# Patient Record
Sex: Male | Born: 1935 | Race: White | Hispanic: No | Smoking: Never smoker
Health system: Southern US, Community
[De-identification: ages and names within clinical notes are randomized; demographics above are authoritative.]

## PROBLEM LIST (undated history)

## (undated) DIAGNOSIS — N189 Chronic kidney disease, unspecified: Secondary | ICD-10-CM

## (undated) DIAGNOSIS — G4700 Insomnia, unspecified: Secondary | ICD-10-CM

## (undated) DIAGNOSIS — J45909 Unspecified asthma, uncomplicated: Secondary | ICD-10-CM

## (undated) DIAGNOSIS — N400 Enlarged prostate without lower urinary tract symptoms: Secondary | ICD-10-CM

## (undated) DIAGNOSIS — E119 Type 2 diabetes mellitus without complications: Secondary | ICD-10-CM

## (undated) DIAGNOSIS — I2510 Atherosclerotic heart disease of native coronary artery without angina pectoris: Secondary | ICD-10-CM

## (undated) DIAGNOSIS — I509 Heart failure, unspecified: Secondary | ICD-10-CM

## (undated) DIAGNOSIS — N289 Disorder of kidney and ureter, unspecified: Secondary | ICD-10-CM

## (undated) DIAGNOSIS — I739 Peripheral vascular disease, unspecified: Secondary | ICD-10-CM

## (undated) DIAGNOSIS — I4891 Unspecified atrial fibrillation: Secondary | ICD-10-CM

## (undated) DIAGNOSIS — E785 Hyperlipidemia, unspecified: Secondary | ICD-10-CM

## (undated) HISTORY — PX: CORONARY ARTERY BYPASS GRAFT: SHX141

## (undated) HISTORY — PX: CARDIAC SURGERY: SHX584

---

## 2004-06-19 ENCOUNTER — Other Ambulatory Visit: Payer: Self-pay

## 2004-06-19 ENCOUNTER — Ambulatory Visit: Payer: Self-pay | Admitting: Ophthalmology

## 2004-07-10 ENCOUNTER — Emergency Department: Payer: Self-pay | Admitting: Unknown Physician Specialty

## 2004-07-30 ENCOUNTER — Ambulatory Visit: Payer: Self-pay | Admitting: Ophthalmology

## 2004-08-07 ENCOUNTER — Ambulatory Visit: Payer: Self-pay | Admitting: Pain Medicine

## 2004-08-13 ENCOUNTER — Ambulatory Visit: Payer: Self-pay | Admitting: Pain Medicine

## 2004-09-03 ENCOUNTER — Ambulatory Visit: Payer: Self-pay | Admitting: Pain Medicine

## 2005-05-17 ENCOUNTER — Ambulatory Visit: Payer: Self-pay | Admitting: Cardiovascular Disease

## 2006-12-16 ENCOUNTER — Other Ambulatory Visit: Payer: Self-pay

## 2006-12-16 ENCOUNTER — Inpatient Hospital Stay: Payer: Self-pay | Admitting: Internal Medicine

## 2007-02-03 ENCOUNTER — Other Ambulatory Visit: Payer: Self-pay

## 2007-02-03 ENCOUNTER — Ambulatory Visit: Payer: Self-pay | Admitting: Vascular Surgery

## 2007-02-03 ENCOUNTER — Inpatient Hospital Stay: Payer: Self-pay | Admitting: Vascular Surgery

## 2007-08-04 ENCOUNTER — Emergency Department (HOSPITAL_COMMUNITY): Admission: EM | Admit: 2007-08-04 | Discharge: 2007-08-04 | Payer: Self-pay | Admitting: Emergency Medicine

## 2007-09-07 ENCOUNTER — Emergency Department (HOSPITAL_COMMUNITY): Admission: EM | Admit: 2007-09-07 | Discharge: 2007-09-07 | Payer: Self-pay | Admitting: Emergency Medicine

## 2008-10-10 ENCOUNTER — Emergency Department: Payer: Self-pay | Admitting: Emergency Medicine

## 2008-10-13 ENCOUNTER — Emergency Department: Payer: Self-pay | Admitting: Emergency Medicine

## 2009-08-10 ENCOUNTER — Ambulatory Visit: Payer: Self-pay | Admitting: Family Medicine

## 2009-10-05 ENCOUNTER — Emergency Department: Payer: Self-pay | Admitting: Emergency Medicine

## 2009-10-17 ENCOUNTER — Ambulatory Visit: Payer: Self-pay | Admitting: Family Medicine

## 2009-11-01 ENCOUNTER — Emergency Department: Payer: Self-pay | Admitting: Emergency Medicine

## 2010-09-27 ENCOUNTER — Ambulatory Visit: Payer: Self-pay | Admitting: Unknown Physician Specialty

## 2012-08-10 ENCOUNTER — Ambulatory Visit: Payer: Self-pay | Admitting: Family Medicine

## 2013-01-29 ENCOUNTER — Inpatient Hospital Stay: Payer: Self-pay | Admitting: Student

## 2013-01-29 LAB — CBC WITH DIFFERENTIAL/PLATELET
Basophil #: 0.1 10*3/uL (ref 0.0–0.1)
Basophil %: 0.3 %
Eosinophil #: 0 10*3/uL (ref 0.0–0.7)
HCT: 40.8 % (ref 40.0–52.0)
Lymphocyte #: 0.7 10*3/uL — ABNORMAL LOW (ref 1.0–3.6)
MCH: 31.5 pg (ref 26.0–34.0)
MCHC: 34.3 g/dL (ref 32.0–36.0)
MCV: 92 fL (ref 80–100)
Monocyte #: 0.8 x10 3/mm (ref 0.2–1.0)
Monocyte %: 4.8 %
Neutrophil #: 15.1 10*3/uL — ABNORMAL HIGH (ref 1.4–6.5)
Neutrophil %: 90.5 %
Platelet: 183 10*3/uL (ref 150–440)
RBC: 4.44 10*6/uL (ref 4.40–5.90)
RDW: 12.9 % (ref 11.5–14.5)

## 2013-01-29 LAB — COMPREHENSIVE METABOLIC PANEL
Albumin: 3.1 g/dL — ABNORMAL LOW (ref 3.4–5.0)
Alkaline Phosphatase: 86 U/L (ref 50–136)
Calcium, Total: 8.1 mg/dL — ABNORMAL LOW (ref 8.5–10.1)
Chloride: 105 mmol/L (ref 98–107)
Co2: 24 mmol/L (ref 21–32)
Creatinine: 1.38 mg/dL — ABNORMAL HIGH (ref 0.60–1.30)
Osmolality: 286 (ref 275–301)
Potassium: 3.9 mmol/L (ref 3.5–5.1)
SGPT (ALT): 19 U/L (ref 12–78)

## 2013-01-29 LAB — URINALYSIS, COMPLETE
Glucose,UR: >=500 mg/dL (ref 0–75)
Ph: 5 (ref 4.5–8.0)
RBC,UR: 4 /HPF (ref 0–5)
Specific Gravity: 1.021 (ref 1.003–1.030)

## 2013-01-29 LAB — TROPONIN I: Troponin-I: <0.02 ng/mL

## 2013-01-29 LAB — RAPID INFLUENZA A&B ANTIGENS

## 2013-01-30 LAB — CBC WITH DIFFERENTIAL/PLATELET
Basophil #: 0.1 10*3/uL (ref 0.0–0.1)
Basophil %: 0.5 %
Eosinophil %: 2.2 %
Lymphocyte #: 1.4 10*3/uL (ref 1.0–3.6)
MCH: 31.9 pg (ref 26.0–34.0)
MCV: 92 fL (ref 80–100)
Monocyte #: 0.8 x10 3/mm (ref 0.2–1.0)
Monocyte %: 6.7 %
Neutrophil %: 78.5 %
Platelet: 186 10*3/uL (ref 150–440)
RBC: 4.35 10*6/uL — ABNORMAL LOW (ref 4.40–5.90)
RDW: 13 % (ref 11.5–14.5)
WBC: 11.7 10*3/uL — ABNORMAL HIGH (ref 3.8–10.6)

## 2013-01-30 LAB — URINE CULTURE

## 2013-02-01 LAB — BASIC METABOLIC PANEL
Anion Gap: 7 (ref 7–16)
BUN: 13 mg/dL (ref 7–18)
Calcium, Total: 8.6 mg/dL (ref 8.5–10.1)
Co2: 24 mmol/L (ref 21–32)
Creatinine: 1.05 mg/dL (ref 0.60–1.30)
EGFR (African American): >60
EGFR (Non-African Amer.): >60
Sodium: 139 mmol/L (ref 136–145)

## 2013-02-03 ENCOUNTER — Emergency Department: Payer: Self-pay | Admitting: Emergency Medicine

## 2013-02-03 LAB — CULTURE, BLOOD (SINGLE)

## 2013-02-04 ENCOUNTER — Emergency Department: Payer: Self-pay | Admitting: Emergency Medicine

## 2013-02-04 LAB — CBC WITH DIFFERENTIAL/PLATELET
Basophil #: 0.1 10*3/uL (ref 0.0–0.1)
Basophil %: 0.6 %
Eosinophil #: 0.4 10*3/uL (ref 0.0–0.7)
HCT: 39.5 % — ABNORMAL LOW (ref 40.0–52.0)
Lymphocyte #: 1.4 10*3/uL (ref 1.0–3.6)
Lymphocyte %: 11.1 %
MCH: 31.8 pg (ref 26.0–34.0)
MCHC: 34.3 g/dL (ref 32.0–36.0)
Monocyte #: 1.1 x10 3/mm — ABNORMAL HIGH (ref 0.2–1.0)
Monocyte %: 8.2 %
Neutrophil %: 77.3 %
Platelet: 223 10*3/uL (ref 150–440)
RDW: 13 % (ref 11.5–14.5)
WBC: 12.9 10*3/uL — ABNORMAL HIGH (ref 3.8–10.6)

## 2013-02-04 LAB — BASIC METABOLIC PANEL
Anion Gap: 7 (ref 7–16)
Calcium, Total: 8.9 mg/dL (ref 8.5–10.1)
Chloride: 102 mmol/L (ref 98–107)
Creatinine: 1.27 mg/dL (ref 0.60–1.30)
EGFR (African American): 58 — ABNORMAL LOW
EGFR (Non-African Amer.): 50 — ABNORMAL LOW
Osmolality: 274 (ref 275–301)

## 2013-02-04 LAB — URINALYSIS, COMPLETE
Bacteria: NONE SEEN
Bilirubin,UR: NEGATIVE
Glucose,UR: NEGATIVE mg/dL (ref 0–75)
Ketone: NEGATIVE
Leukocyte Esterase: NEGATIVE
Ph: 6 (ref 4.5–8.0)
Squamous Epithelial: NONE SEEN

## 2013-02-05 ENCOUNTER — Emergency Department: Payer: Self-pay | Admitting: Emergency Medicine

## 2013-02-08 ENCOUNTER — Emergency Department: Payer: Self-pay | Admitting: Emergency Medicine

## 2013-02-16 ENCOUNTER — Ambulatory Visit: Payer: Self-pay | Admitting: Family Medicine

## 2013-07-15 ENCOUNTER — Ambulatory Visit: Payer: Self-pay | Admitting: Otolaryngology

## 2013-09-09 ENCOUNTER — Ambulatory Visit: Payer: Self-pay | Admitting: Family Medicine

## 2013-09-12 ENCOUNTER — Inpatient Hospital Stay: Payer: Self-pay | Admitting: Internal Medicine

## 2013-09-12 LAB — CBC
HCT: 40.4 % (ref 40.0–52.0)
HGB: 13.4 g/dL (ref 13.0–18.0)
MCH: 31 pg (ref 26.0–34.0)
MCHC: 33.2 g/dL (ref 32.0–36.0)
MCV: 93 fL (ref 80–100)
Platelet: 204 10*3/uL (ref 150–440)
RBC: 4.33 10*6/uL — AB (ref 4.40–5.90)
RDW: 13.1 % (ref 11.5–14.5)
WBC: 11.4 10*3/uL — ABNORMAL HIGH (ref 3.8–10.6)

## 2013-09-12 LAB — BASIC METABOLIC PANEL
Anion Gap: 6 — ABNORMAL LOW (ref 7–16)
BUN: 24 mg/dL — ABNORMAL HIGH (ref 7–18)
Calcium, Total: 8.6 mg/dL (ref 8.5–10.1)
Chloride: 104 mmol/L (ref 98–107)
Co2: 25 mmol/L (ref 21–32)
Creatinine: 1.19 mg/dL (ref 0.60–1.30)
GFR CALC NON AF AMER: 54 — AB
Glucose: 156 mg/dL — ABNORMAL HIGH (ref 65–99)
Osmolality: 277 (ref 275–301)
POTASSIUM: 4 mmol/L (ref 3.5–5.1)
Sodium: 135 mmol/L — ABNORMAL LOW (ref 136–145)

## 2013-09-12 LAB — CK TOTAL AND CKMB (NOT AT ARMC)
CK, Total: 54 U/L
CK, Total: 55 U/L
CK, Total: 63 U/L
CK-MB: 2.2 ng/mL (ref 0.5–3.6)
CK-MB: 1.9 ng/mL (ref 0.5–3.6)
CK-MB: 2 ng/mL (ref 0.5–3.6)

## 2013-09-12 LAB — TROPONIN I
Troponin-I: 0.06 ng/mL — ABNORMAL HIGH
Troponin-I: 0.05 ng/mL
Troponin-I: 0.06 ng/mL — ABNORMAL HIGH

## 2013-09-12 LAB — PRO B NATRIURETIC PEPTIDE: B-Type Natriuretic Peptide: 2073 pg/mL — ABNORMAL HIGH (ref 0–450)

## 2013-09-13 LAB — BASIC METABOLIC PANEL
Anion Gap: 6 — ABNORMAL LOW (ref 7–16)
BUN: 18 mg/dL (ref 7–18)
Calcium, Total: 8.5 mg/dL (ref 8.5–10.1)
Chloride: 107 mmol/L (ref 98–107)
Co2: 28 mmol/L (ref 21–32)
Creatinine: 1.23 mg/dL (ref 0.60–1.30)
EGFR (Non-African Amer.): 52 — ABNORMAL LOW
GFR CALC AF AMER: 60 — AB
Glucose: 67 mg/dL (ref 65–99)
Osmolality: 281 (ref 275–301)
Potassium: 3.3 mmol/L — ABNORMAL LOW (ref 3.5–5.1)
SODIUM: 141 mmol/L (ref 136–145)

## 2013-09-13 LAB — CBC WITH DIFFERENTIAL/PLATELET
BASOS ABS: 0 10*3/uL (ref 0.0–0.1)
Basophil %: 0.5 %
EOS ABS: 0.2 10*3/uL (ref 0.0–0.7)
Eosinophil %: 2.4 %
HCT: 39.5 % — ABNORMAL LOW (ref 40.0–52.0)
HGB: 13.3 g/dL (ref 13.0–18.0)
Lymphocyte #: 1.4 10*3/uL (ref 1.0–3.6)
Lymphocyte %: 15.2 %
MCH: 31.1 pg (ref 26.0–34.0)
MCHC: 33.6 g/dL (ref 32.0–36.0)
MCV: 93 fL (ref 80–100)
MONOS PCT: 9.5 %
Monocyte #: 0.9 x10 3/mm (ref 0.2–1.0)
Neutrophil #: 6.8 10*3/uL — ABNORMAL HIGH (ref 1.4–6.5)
Neutrophil %: 72.4 %
Platelet: 214 10*3/uL (ref 150–440)
RBC: 4.27 10*6/uL — ABNORMAL LOW (ref 4.40–5.90)
RDW: 13.4 % (ref 11.5–14.5)
WBC: 9.4 10*3/uL (ref 3.8–10.6)

## 2013-09-13 LAB — LIPID PANEL
Cholesterol: 113 mg/dL (ref 0–200)
HDL Cholesterol: 36 mg/dL — ABNORMAL LOW (ref 40–60)
Ldl Cholesterol, Calc: 64 mg/dL (ref 0–100)
Triglycerides: 63 mg/dL (ref 0–200)
VLDL CHOLESTEROL, CALC: 13 mg/dL (ref 5–40)

## 2013-09-13 LAB — TSH: Thyroid Stimulating Horm: 1.19 u[IU]/mL

## 2013-09-14 LAB — BASIC METABOLIC PANEL
ANION GAP: 5 — AB (ref 7–16)
BUN: 25 mg/dL — ABNORMAL HIGH (ref 7–18)
CALCIUM: 8.7 mg/dL (ref 8.5–10.1)
CHLORIDE: 102 mmol/L (ref 98–107)
CO2: 31 mmol/L (ref 21–32)
CREATININE: 1.48 mg/dL — AB (ref 0.60–1.30)
EGFR (Non-African Amer.): 41 — ABNORMAL LOW
GFR CALC AF AMER: 48 — AB
Glucose: 124 mg/dL — ABNORMAL HIGH (ref 65–99)
Osmolality: 281 (ref 275–301)
POTASSIUM: 3.6 mmol/L (ref 3.5–5.1)
Sodium: 138 mmol/L (ref 136–145)

## 2013-09-15 LAB — CBC WITH DIFFERENTIAL/PLATELET
Basophil #: 0.1 10*3/uL (ref 0.0–0.1)
Basophil %: 0.4 %
Eosinophil #: 0.5 10*3/uL (ref 0.0–0.7)
Eosinophil %: 3.8 %
HCT: 41.2 % (ref 40.0–52.0)
HGB: 13.5 g/dL (ref 13.0–18.0)
LYMPHS ABS: 1.2 10*3/uL (ref 1.0–3.6)
Lymphocyte %: 8.8 %
MCH: 30.4 pg (ref 26.0–34.0)
MCHC: 32.8 g/dL (ref 32.0–36.0)
MCV: 93 fL (ref 80–100)
Monocyte #: 1.2 x10 3/mm — ABNORMAL HIGH (ref 0.2–1.0)
Monocyte %: 8.8 %
NEUTROS ABS: 10.7 10*3/uL — AB (ref 1.4–6.5)
NEUTROS PCT: 78.2 %
PLATELETS: 226 10*3/uL (ref 150–440)
RBC: 4.44 10*6/uL (ref 4.40–5.90)
RDW: 13 % (ref 11.5–14.5)
WBC: 13.7 10*3/uL — ABNORMAL HIGH (ref 3.8–10.6)

## 2013-09-15 LAB — BASIC METABOLIC PANEL
ANION GAP: 4 — AB (ref 7–16)
BUN: 23 mg/dL — ABNORMAL HIGH (ref 7–18)
CO2: 32 mmol/L (ref 21–32)
Calcium, Total: 8.7 mg/dL (ref 8.5–10.1)
Chloride: 100 mmol/L (ref 98–107)
Creatinine: 1.36 mg/dL — ABNORMAL HIGH (ref 0.60–1.30)
GFR CALC AF AMER: 53 — AB
GFR CALC NON AF AMER: 46 — AB
GLUCOSE: 72 mg/dL (ref 65–99)
Osmolality: 274 (ref 275–301)
POTASSIUM: 3.3 mmol/L — AB (ref 3.5–5.1)
Sodium: 136 mmol/L (ref 136–145)

## 2013-09-26 ENCOUNTER — Emergency Department: Payer: Self-pay | Admitting: Emergency Medicine

## 2013-09-26 LAB — URINALYSIS, COMPLETE
Bilirubin,UR: NEGATIVE
Glucose,UR: NEGATIVE mg/dL (ref 0–75)
Hyaline Cast: 3
Ketone: NEGATIVE
Nitrite: NEGATIVE
PH: 5 (ref 4.5–8.0)
PROTEIN: NEGATIVE
RBC,UR: 12 /HPF (ref 0–5)
SPECIFIC GRAVITY: 1.013 (ref 1.003–1.030)
Squamous Epithelial: NONE SEEN
WBC UR: 728 /HPF (ref 0–5)

## 2013-09-29 LAB — URINE CULTURE

## 2013-12-07 ENCOUNTER — Encounter: Payer: Self-pay | Admitting: Podiatry

## 2013-12-07 ENCOUNTER — Ambulatory Visit (INDEPENDENT_AMBULATORY_CARE_PROVIDER_SITE_OTHER): Payer: Medicare Other | Admitting: Podiatry

## 2013-12-07 VITALS — BP 133/86 | HR 69 | Resp 12

## 2013-12-07 DIAGNOSIS — M79609 Pain in unspecified limb: Secondary | ICD-10-CM

## 2013-12-07 DIAGNOSIS — M79673 Pain in unspecified foot: Secondary | ICD-10-CM

## 2013-12-07 DIAGNOSIS — B351 Tinea unguium: Secondary | ICD-10-CM

## 2013-12-07 NOTE — Progress Notes (Signed)
Subjective:     Patient ID: Martin [name], male   DOB: [birth year], 80 y.o.   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

## 2013-12-07 NOTE — Progress Notes (Signed)
   Subjective:    Patient ID: Martin [name], male    DOB: 11/27/1921, 80 y.o.   MRN: [id number]  HPI   PT STATED TOENAILS TRIM.   Review of Systems     Objective:   Physical Exam        Assessment & Plan:

## 2013-12-10 ENCOUNTER — Ambulatory Visit: Payer: Self-pay | Admitting: Podiatry

## 2013-12-28 ENCOUNTER — Ambulatory Visit (INDEPENDENT_AMBULATORY_CARE_PROVIDER_SITE_OTHER): Payer: Medicare Other | Admitting: Podiatry

## 2013-12-28 VITALS — BP 142/81 | HR 48 | Resp 16

## 2013-12-28 DIAGNOSIS — L03039 Cellulitis of unspecified toe: Secondary | ICD-10-CM

## 2013-12-28 NOTE — Progress Notes (Signed)
Subjective:     Patient ID: Martin [name], male   DOB: Sep 21, 1921, 80 y.o.   MRN: [id number]  HPI patient presents stating I'm having pain in my big toenail right foot with redness and I cannot wear shoe gear on it comfortably   Review of Systems     Objective:   Physical Exam Neurovascular status unchanged with incurvated nailbed right hallux medial border with slight distal redness and irritation noted with no proximal edema erythema or drainage noted    Assessment:     Low-grade paronychia infection of the right hallux    Plan:     Infiltrated the right hallux 60 mg Xylocaine Marcaine mixture and remove the medial border proud flesh and abscess tissue and allowed channel for drainage. Reappoint for [place] to recheck again in the near future

## 2013-12-28 NOTE — Patient Instructions (Signed)

## 2013-12-29 ENCOUNTER — Ambulatory Visit: Payer: Medicare Other | Admitting: Podiatry

## 2014-01-14 ENCOUNTER — Ambulatory Visit (INDEPENDENT_AMBULATORY_CARE_PROVIDER_SITE_OTHER): Payer: Medicare Other | Admitting: Podiatry

## 2014-01-14 ENCOUNTER — Encounter: Payer: Self-pay | Admitting: Podiatry

## 2014-01-14 DIAGNOSIS — L03039 Cellulitis of unspecified toe: Secondary | ICD-10-CM

## 2014-01-14 NOTE — Progress Notes (Signed)
Subjective:     Patient ID: Martin [name], male   DOB: 1921/08/26, 80 y.o.   MRN: [id number]  HPI patient states check the right big toe and I'm developing an infection on the outer border of my left big toe   Review of Systems     Objective:   Physical Exam Neurovascular status intact with incurvated nailbed left hallux lateral border that's painful with drainage noted and well-healing right hallux medial border after having paronychia excision    Assessment:     Paronychia hallux left and well-healing surgical site right    Plan:     H&P and conditions discussed with patient. Today I infiltrated the left hallux 60 mg Xylocaine Marcaine and remove the lateral border and proud flesh abscess tissue and allowed channel for drainage. Instructed on soaks and reappoint her recheck as needed

## 2014-05-13 ENCOUNTER — Ambulatory Visit (INDEPENDENT_AMBULATORY_CARE_PROVIDER_SITE_OTHER): Payer: Medicare Other | Admitting: Podiatry

## 2014-05-13 DIAGNOSIS — M79673 Pain in unspecified foot: Secondary | ICD-10-CM | POA: Diagnosis not present

## 2014-05-13 DIAGNOSIS — B351 Tinea unguium: Secondary | ICD-10-CM | POA: Diagnosis not present

## 2014-05-13 DIAGNOSIS — M1711 Unilateral primary osteoarthritis, right knee: Secondary | ICD-10-CM | POA: Diagnosis not present

## 2014-05-13 DIAGNOSIS — M25562 Pain in left knee: Secondary | ICD-10-CM | POA: Diagnosis not present

## 2014-05-13 DIAGNOSIS — M25561 Pain in right knee: Secondary | ICD-10-CM | POA: Diagnosis not present

## 2014-05-13 NOTE — Progress Notes (Signed)
Subjective:     Patient ID: Martin [name], male   DOB: [birth year], 81 y.o.   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

## 2014-05-17 ENCOUNTER — Inpatient Hospital Stay: Payer: Self-pay | Admitting: Internal Medicine

## 2014-05-17 DIAGNOSIS — I517 Cardiomegaly: Secondary | ICD-10-CM | POA: Diagnosis not present

## 2014-05-17 DIAGNOSIS — E119 Type 2 diabetes mellitus without complications: Secondary | ICD-10-CM | POA: Diagnosis not present

## 2014-05-17 DIAGNOSIS — I5022 Chronic systolic (congestive) heart failure: Secondary | ICD-10-CM | POA: Diagnosis not present

## 2014-05-17 DIAGNOSIS — I2510 Atherosclerotic heart disease of native coronary artery without angina pectoris: Secondary | ICD-10-CM | POA: Diagnosis not present

## 2014-05-17 DIAGNOSIS — Z951 Presence of aortocoronary bypass graft: Secondary | ICD-10-CM | POA: Diagnosis not present

## 2014-05-17 DIAGNOSIS — N179 Acute kidney failure, unspecified: Secondary | ICD-10-CM | POA: Diagnosis not present

## 2014-05-17 DIAGNOSIS — J9610 Chronic respiratory failure, unspecified whether with hypoxia or hypercapnia: Secondary | ICD-10-CM | POA: Diagnosis not present

## 2014-05-17 DIAGNOSIS — B964 Proteus (mirabilis) (morganii) as the cause of diseases classified elsewhere: Secondary | ICD-10-CM | POA: Diagnosis not present

## 2014-05-17 DIAGNOSIS — R0602 Shortness of breath: Secondary | ICD-10-CM | POA: Diagnosis not present

## 2014-05-17 DIAGNOSIS — I4891 Unspecified atrial fibrillation: Secondary | ICD-10-CM | POA: Diagnosis not present

## 2014-05-17 DIAGNOSIS — N390 Urinary tract infection, site not specified: Secondary | ICD-10-CM | POA: Diagnosis not present

## 2014-05-17 DIAGNOSIS — R0989 Other specified symptoms and signs involving the circulatory and respiratory systems: Secondary | ICD-10-CM | POA: Diagnosis not present

## 2014-05-17 LAB — COMPREHENSIVE METABOLIC PANEL
ALK PHOS: 83 U/L
AST: 19 U/L (ref 15–37)
Albumin: 3.7 g/dL (ref 3.4–5.0)
Anion Gap: 8 (ref 7–16)
BILIRUBIN TOTAL: 0.3 mg/dL (ref 0.2–1.0)
BUN: 54 mg/dL — AB (ref 7–18)
CO2: 27 mmol/L (ref 21–32)
CREATININE: 2.74 mg/dL — AB (ref 0.60–1.30)
Calcium, Total: 8.7 mg/dL (ref 8.5–10.1)
Chloride: 105 mmol/L (ref 98–107)
EGFR (African American): 28 — ABNORMAL LOW
EGFR (Non-African Amer.): 23 — ABNORMAL LOW
Glucose: 227 mg/dL — ABNORMAL HIGH (ref 65–99)
Osmolality: 301 (ref 275–301)
Potassium: 4.7 mmol/L (ref 3.5–5.1)
SGPT (ALT): 29 U/L
Sodium: 140 mmol/L (ref 136–145)
Total Protein: 7.9 g/dL (ref 6.4–8.2)

## 2014-05-17 LAB — URINALYSIS, COMPLETE
BILIRUBIN, UR: NEGATIVE
BLOOD: NEGATIVE
Glucose,UR: 50 mg/dL (ref 0–75)
Hyaline Cast: 2
KETONE: NEGATIVE
Nitrite: NEGATIVE
PROTEIN: NEGATIVE
Ph: 5 (ref 4.5–8.0)
RBC,UR: 1 /HPF (ref 0–5)
Specific Gravity: 1.01 (ref 1.003–1.030)
Squamous Epithelial: NONE SEEN
WBC UR: 120 /HPF (ref 0–5)

## 2014-05-17 LAB — CBC
HCT: 40.2 % (ref 40.0–52.0)
HGB: 13.1 g/dL (ref 13.0–18.0)
MCH: 31.4 pg (ref 26.0–34.0)
MCHC: 32.7 g/dL (ref 32.0–36.0)
MCV: 96 fL (ref 80–100)
Platelet: 263 10*3/uL (ref 150–440)
RBC: 4.18 10*6/uL — AB (ref 4.40–5.90)
RDW: 12.3 % (ref 11.5–14.5)
WBC: 22.4 10*3/uL — ABNORMAL HIGH (ref 3.8–10.6)

## 2014-05-17 LAB — TROPONIN I

## 2014-05-18 LAB — BASIC METABOLIC PANEL
Anion Gap: 10 (ref 7–16)
BUN: 48 mg/dL — ABNORMAL HIGH (ref 7–18)
CHLORIDE: 105 mmol/L (ref 98–107)
Calcium, Total: 8.3 mg/dL — ABNORMAL LOW (ref 8.5–10.1)
Co2: 24 mmol/L (ref 21–32)
Creatinine: 2.57 mg/dL — ABNORMAL HIGH (ref 0.60–1.30)
EGFR (African American): 30 — ABNORMAL LOW
EGFR (Non-African Amer.): 25 — ABNORMAL LOW
Glucose: 126 mg/dL — ABNORMAL HIGH (ref 65–99)
Osmolality: 292 (ref 275–301)
POTASSIUM: 4.1 mmol/L (ref 3.5–5.1)
SODIUM: 139 mmol/L (ref 136–145)

## 2014-05-18 LAB — CBC WITH DIFFERENTIAL/PLATELET
BASOS ABS: 0.1 10*3/uL (ref 0.0–0.1)
Basophil %: 0.4 %
Eosinophil #: 0.1 10*3/uL (ref 0.0–0.7)
Eosinophil %: 0.4 %
HCT: 35.8 % — ABNORMAL LOW (ref 40.0–52.0)
HGB: 11.9 g/dL — ABNORMAL LOW (ref 13.0–18.0)
LYMPHS PCT: 8.1 %
Lymphocyte #: 1.8 10*3/uL (ref 1.0–3.6)
MCH: 30.9 pg (ref 26.0–34.0)
MCHC: 33.1 g/dL (ref 32.0–36.0)
MCV: 93 fL (ref 80–100)
MONOS PCT: 5.5 %
Monocyte #: 1.3 x10 3/mm — ABNORMAL HIGH (ref 0.2–1.0)
Neutrophil #: 19.5 10*3/uL — ABNORMAL HIGH (ref 1.4–6.5)
Neutrophil %: 85.6 %
PLATELETS: 216 10*3/uL (ref 150–440)
RBC: 3.84 10*6/uL — ABNORMAL LOW (ref 4.40–5.90)
RDW: 12.5 % (ref 11.5–14.5)
WBC: 22.8 10*3/uL — AB (ref 3.8–10.6)

## 2014-05-19 LAB — BASIC METABOLIC PANEL
Anion Gap: 6 — ABNORMAL LOW (ref 7–16)
BUN: 41 mg/dL — ABNORMAL HIGH (ref 7–18)
CALCIUM: 8.6 mg/dL (ref 8.5–10.1)
CO2: 26 mmol/L (ref 21–32)
Chloride: 108 mmol/L — ABNORMAL HIGH (ref 98–107)
Creatinine: 2.16 mg/dL — ABNORMAL HIGH (ref 0.60–1.30)
EGFR (Non-African Amer.): 31 — ABNORMAL LOW
GFR CALC AF AMER: 37 — AB
Glucose: 57 mg/dL — ABNORMAL LOW (ref 65–99)
Osmolality: 287 (ref 275–301)
Potassium: 4.3 mmol/L (ref 3.5–5.1)
Sodium: 140 mmol/L (ref 136–145)

## 2014-05-20 LAB — BASIC METABOLIC PANEL
ANION GAP: 4 — AB (ref 7–16)
BUN: 34 mg/dL — ABNORMAL HIGH (ref 7–18)
CREATININE: 1.79 mg/dL — AB (ref 0.60–1.30)
Calcium, Total: 8.5 mg/dL (ref 8.5–10.1)
Chloride: 110 mmol/L — ABNORMAL HIGH (ref 98–107)
Co2: 26 mmol/L (ref 21–32)
EGFR (African American): 46 — ABNORMAL LOW
EGFR (Non-African Amer.): 38 — ABNORMAL LOW
Glucose: 98 mg/dL (ref 65–99)
Osmolality: 287 (ref 275–301)
Potassium: 4.4 mmol/L (ref 3.5–5.1)
SODIUM: 140 mmol/L (ref 136–145)

## 2014-05-20 LAB — URINE CULTURE

## 2014-05-22 LAB — CULTURE, BLOOD (SINGLE)

## 2014-05-24 DIAGNOSIS — R269 Unspecified abnormalities of gait and mobility: Secondary | ICD-10-CM | POA: Diagnosis not present

## 2014-05-24 DIAGNOSIS — I129 Hypertensive chronic kidney disease with stage 1 through stage 4 chronic kidney disease, or unspecified chronic kidney disease: Secondary | ICD-10-CM | POA: Diagnosis not present

## 2014-05-24 DIAGNOSIS — N186 End stage renal disease: Secondary | ICD-10-CM | POA: Diagnosis not present

## 2014-05-24 DIAGNOSIS — M6281 Muscle weakness (generalized): Secondary | ICD-10-CM | POA: Diagnosis not present

## 2014-05-27 DIAGNOSIS — M6281 Muscle weakness (generalized): Secondary | ICD-10-CM | POA: Diagnosis not present

## 2014-05-27 DIAGNOSIS — I129 Hypertensive chronic kidney disease with stage 1 through stage 4 chronic kidney disease, or unspecified chronic kidney disease: Secondary | ICD-10-CM | POA: Diagnosis not present

## 2014-05-27 DIAGNOSIS — R269 Unspecified abnormalities of gait and mobility: Secondary | ICD-10-CM | POA: Diagnosis not present

## 2014-05-27 DIAGNOSIS — N186 End stage renal disease: Secondary | ICD-10-CM | POA: Diagnosis not present

## 2014-05-30 DIAGNOSIS — N186 End stage renal disease: Secondary | ICD-10-CM | POA: Diagnosis not present

## 2014-05-30 DIAGNOSIS — G629 Polyneuropathy, unspecified: Secondary | ICD-10-CM | POA: Diagnosis not present

## 2014-05-30 DIAGNOSIS — M6281 Muscle weakness (generalized): Secondary | ICD-10-CM | POA: Diagnosis not present

## 2014-05-30 DIAGNOSIS — I129 Hypertensive chronic kidney disease with stage 1 through stage 4 chronic kidney disease, or unspecified chronic kidney disease: Secondary | ICD-10-CM | POA: Diagnosis not present

## 2014-05-30 DIAGNOSIS — E119 Type 2 diabetes mellitus without complications: Secondary | ICD-10-CM | POA: Diagnosis not present

## 2014-05-30 DIAGNOSIS — N400 Enlarged prostate without lower urinary tract symptoms: Secondary | ICD-10-CM | POA: Diagnosis not present

## 2014-05-30 DIAGNOSIS — I10 Essential (primary) hypertension: Secondary | ICD-10-CM | POA: Diagnosis not present

## 2014-05-30 DIAGNOSIS — R269 Unspecified abnormalities of gait and mobility: Secondary | ICD-10-CM | POA: Diagnosis not present

## 2014-06-01 DIAGNOSIS — R269 Unspecified abnormalities of gait and mobility: Secondary | ICD-10-CM | POA: Diagnosis not present

## 2014-06-01 DIAGNOSIS — M6281 Muscle weakness (generalized): Secondary | ICD-10-CM | POA: Diagnosis not present

## 2014-06-01 DIAGNOSIS — N186 End stage renal disease: Secondary | ICD-10-CM | POA: Diagnosis not present

## 2014-06-01 DIAGNOSIS — I129 Hypertensive chronic kidney disease with stage 1 through stage 4 chronic kidney disease, or unspecified chronic kidney disease: Secondary | ICD-10-CM | POA: Diagnosis not present

## 2014-06-03 DIAGNOSIS — M6281 Muscle weakness (generalized): Secondary | ICD-10-CM | POA: Diagnosis not present

## 2014-06-03 DIAGNOSIS — R269 Unspecified abnormalities of gait and mobility: Secondary | ICD-10-CM | POA: Diagnosis not present

## 2014-06-03 DIAGNOSIS — I129 Hypertensive chronic kidney disease with stage 1 through stage 4 chronic kidney disease, or unspecified chronic kidney disease: Secondary | ICD-10-CM | POA: Diagnosis not present

## 2014-06-03 DIAGNOSIS — N186 End stage renal disease: Secondary | ICD-10-CM | POA: Diagnosis not present

## 2014-06-06 DIAGNOSIS — I129 Hypertensive chronic kidney disease with stage 1 through stage 4 chronic kidney disease, or unspecified chronic kidney disease: Secondary | ICD-10-CM | POA: Diagnosis not present

## 2014-06-06 DIAGNOSIS — M6281 Muscle weakness (generalized): Secondary | ICD-10-CM | POA: Diagnosis not present

## 2014-06-06 DIAGNOSIS — N186 End stage renal disease: Secondary | ICD-10-CM | POA: Diagnosis not present

## 2014-06-06 DIAGNOSIS — R269 Unspecified abnormalities of gait and mobility: Secondary | ICD-10-CM | POA: Diagnosis not present

## 2014-06-08 DIAGNOSIS — R269 Unspecified abnormalities of gait and mobility: Secondary | ICD-10-CM | POA: Diagnosis not present

## 2014-06-08 DIAGNOSIS — N186 End stage renal disease: Secondary | ICD-10-CM | POA: Diagnosis not present

## 2014-06-08 DIAGNOSIS — I129 Hypertensive chronic kidney disease with stage 1 through stage 4 chronic kidney disease, or unspecified chronic kidney disease: Secondary | ICD-10-CM | POA: Diagnosis not present

## 2014-06-08 DIAGNOSIS — M6281 Muscle weakness (generalized): Secondary | ICD-10-CM | POA: Diagnosis not present

## 2014-06-10 DIAGNOSIS — R269 Unspecified abnormalities of gait and mobility: Secondary | ICD-10-CM | POA: Diagnosis not present

## 2014-06-10 DIAGNOSIS — I129 Hypertensive chronic kidney disease with stage 1 through stage 4 chronic kidney disease, or unspecified chronic kidney disease: Secondary | ICD-10-CM | POA: Diagnosis not present

## 2014-06-10 DIAGNOSIS — M6281 Muscle weakness (generalized): Secondary | ICD-10-CM | POA: Diagnosis not present

## 2014-06-10 DIAGNOSIS — N186 End stage renal disease: Secondary | ICD-10-CM | POA: Diagnosis not present

## 2014-06-13 DIAGNOSIS — G8929 Other chronic pain: Secondary | ICD-10-CM | POA: Diagnosis not present

## 2014-06-13 DIAGNOSIS — G629 Polyneuropathy, unspecified: Secondary | ICD-10-CM | POA: Diagnosis not present

## 2014-06-13 DIAGNOSIS — I10 Essential (primary) hypertension: Secondary | ICD-10-CM | POA: Diagnosis not present

## 2014-06-13 DIAGNOSIS — N400 Enlarged prostate without lower urinary tract symptoms: Secondary | ICD-10-CM | POA: Diagnosis not present

## 2014-07-04 DIAGNOSIS — I10 Essential (primary) hypertension: Secondary | ICD-10-CM | POA: Diagnosis not present

## 2014-07-04 DIAGNOSIS — I739 Peripheral vascular disease, unspecified: Secondary | ICD-10-CM | POA: Diagnosis not present

## 2014-07-04 DIAGNOSIS — G629 Polyneuropathy, unspecified: Secondary | ICD-10-CM | POA: Diagnosis not present

## 2014-07-04 DIAGNOSIS — I509 Heart failure, unspecified: Secondary | ICD-10-CM | POA: Diagnosis not present

## 2014-07-04 DIAGNOSIS — R600 Localized edema: Secondary | ICD-10-CM | POA: Diagnosis not present

## 2014-07-04 DIAGNOSIS — R29898 Other symptoms and signs involving the musculoskeletal system: Secondary | ICD-10-CM | POA: Diagnosis not present

## 2014-07-04 DIAGNOSIS — G8929 Other chronic pain: Secondary | ICD-10-CM | POA: Diagnosis not present

## 2014-07-04 DIAGNOSIS — E119 Type 2 diabetes mellitus without complications: Secondary | ICD-10-CM | POA: Diagnosis not present

## 2014-07-25 DIAGNOSIS — M1711 Unilateral primary osteoarthritis, right knee: Secondary | ICD-10-CM | POA: Diagnosis not present

## 2014-07-25 DIAGNOSIS — M25561 Pain in right knee: Secondary | ICD-10-CM | POA: Diagnosis not present

## 2014-08-02 DIAGNOSIS — R29898 Other symptoms and signs involving the musculoskeletal system: Secondary | ICD-10-CM | POA: Diagnosis not present

## 2014-08-02 DIAGNOSIS — I509 Heart failure, unspecified: Secondary | ICD-10-CM | POA: Diagnosis not present

## 2014-08-02 DIAGNOSIS — R600 Localized edema: Secondary | ICD-10-CM | POA: Diagnosis not present

## 2014-08-02 DIAGNOSIS — I739 Peripheral vascular disease, unspecified: Secondary | ICD-10-CM | POA: Diagnosis not present

## 2014-08-12 ENCOUNTER — Ambulatory Visit: Payer: Medicaid Other

## 2014-08-12 DIAGNOSIS — E119 Type 2 diabetes mellitus without complications: Secondary | ICD-10-CM | POA: Diagnosis not present

## 2014-08-12 DIAGNOSIS — I10 Essential (primary) hypertension: Secondary | ICD-10-CM | POA: Diagnosis not present

## 2014-08-12 DIAGNOSIS — G629 Polyneuropathy, unspecified: Secondary | ICD-10-CM | POA: Diagnosis not present

## 2014-08-12 DIAGNOSIS — G8929 Other chronic pain: Secondary | ICD-10-CM | POA: Diagnosis not present

## 2014-08-22 DIAGNOSIS — B070 Plantar wart: Secondary | ICD-10-CM | POA: Diagnosis not present

## 2014-08-22 DIAGNOSIS — B351 Tinea unguium: Secondary | ICD-10-CM | POA: Diagnosis not present

## 2014-08-22 DIAGNOSIS — L851 Acquired keratosis [keratoderma] palmaris et plantaris: Secondary | ICD-10-CM | POA: Diagnosis not present

## 2014-08-22 DIAGNOSIS — M79673 Pain in unspecified foot: Secondary | ICD-10-CM | POA: Diagnosis not present

## 2014-08-25 DIAGNOSIS — N390 Urinary tract infection, site not specified: Secondary | ICD-10-CM | POA: Diagnosis not present

## 2014-08-26 NOTE — Discharge Summary (Signed)
PATIENT NAME:  Martin [name], Martin [name] MR#:  [id number] DATE OF BIRTH:  1922/02/26  DATE OF ADMISSION:  01/29/2013 DATE OF DISCHARGE:  02/02/2013  PRIMARY CARE PHYSICIAN:  Dr. Sherrie MustacheFisher, M.D.   CHIEF COMPLAINT: Weakness and shaking.   DISCHARGE DIAGNOSES: 1.  Sepsis in setting of pneumonia.  2.  Urinary retention going home with leg bag. 3.  Chronic atrial fibrillation.  4.  EKG, stage III.  5.  Hypertension.  6.  Hypokalemia.  7.  History of coronary artery disease, status post coronary artery bypass graft.  8.  Diabetes.  9.  History of prostate issues in the past.   DISCHARGE MEDICATIONS: Trazodone 50 mg at bedtime, pravastatin 20 mg at bedtime, amlodipine 10 mg daily, glimepiride 2 mg 2 times a day, aspirin 325 mg daily, cilostazol 100 mg 2 times a day, metoprolol succinate 100 mg extended-release 1 tab daily, potassium chloride 10 mEq daily, tamsulosin 0.4 mg once a day, triazolam 0.25 mg 1 to 2 tabs orally once a day, Vantin 100 mg 1 tab every 12 hours for 3 days, albuterol  2 puffs inhaled 4 times a day as needed for wheezing. He will be going home with home health, PT and RN Foley-to-leg bag.   DIET:  Low sodium.   ACTIVITY:  As tolerated.   DISCHARGE INSTRUCTIONS:  Please follow with PCP within 1 to 2 weeks.   DISPOSITION: Home with home health.   SIGNIFICANT LABORATORIES AND IMAGING:  Initial BUN 32, creatinine 1.38, sodium 136, potassium 3.9. LFTs on arrival showed albumin of 3.1, otherwise within normal limits. Last BUN of 13 on September 29 and creatinine of 1.39. Initial troponin negative. Initial WBC was 16.6. Last WBC of 11.7 on September 27. Initial hemoglobin 14, platelets of 183. Initial blood cultures no growth today. Urine culture showed mixed flora and rapid flu was negative. UA showed 2+, leukocyte esterase 16. WBC, no bacteria.  X-ray of the chest, one view, on September 26 showing right perihilar density. Repeat x-ray of the chest on September 28, this was PA and lateral,  shows interval improvement in the appearance of the alveolar pneumonia in the right upper hemithorax.  HISTORY AND HOSPITAL COURSE:  For full details of H and P, please see the dictation on admission by Dr. Elisabeth PigeonVachhani, but briefly, this is a pleasant 81 year old male hard of hearing who comes in for weakness and shakes. He was found in the ER to have low grade fevers and white count elevated and x-ray suggestive of a pneumonia; therefore, he was admitted to the hospitalist service. He also had a positive UA and urine cultures were sent. He did have afib but upon review of the chart, it looks like the afib is chronic. He is admitted to the hospitalist service and did get a dose of Levaquin and started on ceftriaxone and azithromycin. He did fairly well. His leukocytosis reversed. He did have sepsis per criteria with pneumonia in setting of tachycardia and leukocytosis and that has resolved. Blood cultures have been negative. Influenza was negative. Urine cultures showed mixed flora, but he will be on antibiotics. He feels improved. He did have urinary retention and a Foley had to be inserted with good drainage. It was discontinued but the patient was found to have urinary retention again and he will be going home with a Foley. He will be on Flomax. His low potassium was repleted. At this point, he will be discharged with outpatient followup on the day of discharge. His temperature is 97.8, pulse  rate 88, respiratory rate 20, blood pressure 150/76, O2 sat 94% on room air.  GENERAL:  The patient is a well-developed male obese, elderly lying in bed, no obvious distress, talking in full sentences. He is hard of hearing.  NECK: Supple. He has irregularly irregular heart sounds.  LUNGS: He has mild crackles at the upper base but very good air entry without wheezing or rhonchi.  ABDOMEN: Soft, nontender. He has a Foley.  EXTREMITIES: Show no lower extremity edema.  At this point, he will be discharged to outpatient  follow-up. The case was discussed with his daughter. Home services have been arranged for him.  CODE STATUS:  THE PATIENT IS FULL CODE.  Total Time Spent:  35 minutes   ____________________________ Krystal Eaton, MD sa:ce D: 02/02/2013 16:39:23 ET T: 02/02/2013 18:01:25 ET JOB#: [id number]  cc: Krystal Eaton, MD, <Dictator> Demetrios Isaacs. Sherrie Mustache, MD Krystal Eaton MD ELECTRONICALLY SIGNED 02/18/2013 14:12

## 2014-08-26 NOTE — H&P (Signed)
ADDENDUM  PATIENT NAME:  Martin RosenthalSMITH, Jamile MR#:  [id number] DATE OF BIRTH:  01-Oct-1921  DATE OF ADMISSION:  01/29/2013  PAST MEDICAL HISTORY: The patient has CABG and triple vessel coronary artery disease in the past. He also has hypertension, diabetes and hyperlipidemia with some prostate problems.   PAST SURGICAL HISTORY: Denies anything else other than CABG.   ALLERGIES: No known drug allergy.   FAMILY HISTORY: His father died of some kind of cancer at an early age and the patient was very young, so he does not know what.   SOCIAL HISTORY: He lives in an assisted living facility. He was a smoker, but quit 20 years ago. He was smoking 1 pack every day before. Denies drinking or illegal drug use and he walks with a walker.    ____________________________ Hope PigeonVaibhavkumar G. Elisabeth PigeonVachhani, MD vgv:aw D: 01/29/2013 11:36:37 ET T: 01/29/2013 12:00:14 ET JOB#: [id number]  cc: Hope PigeonVaibhavkumar G. Elisabeth PigeonVachhani, MD, <Dictator> Altamese DillingVAIBHAVKUMAR Burle Kwan MD ELECTRONICALLY SIGNED 01/29/2013 18:42

## 2014-08-26 NOTE — H&P (Signed)
PATIENT NAME:  Martin [name], Martin [name] MR#:  [id number] DATE OF BIRTH:  [birth year]  DATE OF ADMISSION:  01/29/2013  PRIMARY CARE PHYSICIAN:  Dr. Mila Merry.  REFERRING EMERGENCY ROOM PHYSICIAN:  Dr. Loleta Rose.   CHIEF COMPLAINT:  Weakness and shaking.   HISTORY OF PRESENT ILLNESS:  The patient is an 81 year old male who lives in [facility], which is an assisted living facility, and is almost independent in daily activity, he walks with a walker, but since yesterday in the daytime he started feeling a little weak and in the night, he was very shaky and was not even able to go to the bathroom on his own with walker, so in the morning, he decided to call for the help and he has a bell to ring for that assistance in the facility so he rang that, they came over and found him weak and so sent him to the Emergency Room. In the ER, he was found having low-grade fever and his white cell count was elevated. Chest x-ray was suggestive of pneumonia, and so he is being admitted for pneumonia and weakness due to old age.   REVIEW OF SYSTEMS:  CONSTITUTIONAL:  Negative for pain or weight loss, but he have fever, chills, fatigue, and weakness.  EYES:  No blurring, double vision, pain or redness.  EARS, NOSE, THROAT:  No tinnitus, ear pain or hearing loss.  RESPIRATORY:  Denies any cough, wheezing, or shortness of breath.  CARDIOVASCULAR:  Denies any chest pain, orthopnea, palpitations or edema on the legs.   GASTROINTESTINAL:  Denies any nausea, vomiting, diarrhea or abdominal pain.  GENITOURINARY:  Denies any dysuria, hematuria, or increased frequency of urination, but he has chronic problem with the urine. He said that he has to go more frequently and very little amount comes out all the time.  ENDOCRINE:  Denies any increased sweating or heat or cold intolerance.  SKIN:  Denies any acne, rashes or lesions on the skin.  MUSCULOSKELETAL:  Denied any pain or swelling in the joints.  NEUROLOGICAL:  No numbness  but generalized weakness. No tremors or rigidity and denies any headache or focal weakness.  PSYCHIATRIC:  No anxiety, insomnia, bipolar disorder.   MEDICATIONS AS PER THE LIST PROVIDED BY HIM:  1.  Aspirin 325 mg once a day.  2.  Ibuprofen 800 mg 3 times a day as needed.  3.  Triazolam  4.  Metoprolol 100 mg 2 times a day.  5.  Amlodipine 10 mg once a day.  6.  Potassium 100 mEq once a day.  7.  Pravastatin 20 mg once a day.  8.  Glimepiride 2 mg once a day. 10.  Cilostazol 100 mg once a day.   PHYSICAL EXAMINATION:  VITAL SIGNS:  In the ER, temperature 100 degrees Fahrenheit, pulse rate ranging from 95 to 115, respirations 24. Blood pressure 131/53 and his oxygen saturation 92% to 95% on room air.  GENERAL:  The patient is appropriate for his age, mild distress due to overall weakness, some hearing deficit, but he is oriented to time, place and person, and he is with history taking and physical examination.  HEENT:  Head and neck atraumatic. Conjunctivae pink. Oral mucosa moist.  NECK:  Supple. No JVD.  RESPIRATORY:  Bilateral equal air entry. Few crepitations heard.  CARDIOVASCULAR:  There is a scar of the surgery in mid sternum present. Irregular heartbeat, systolic murmur present.  ABDOMEN:  Soft, nontender, obese, bowel sounds present.  SKIN:  No rashes.  LEGS:  No edema.  NEUROLOGICAL:  Power 4/5 all 4 limbs. Follows commands. Generalized weakness.  JOINTS:  There is a scar of surgery on the left knee present. No swelling or tenderness.  PSYCHIATRIC:  Does not appear in any acute psychiatric illness.   IMPORTANT LABORATORY RESULTS:  Glucose 222, BUN 32, creatinine 1.38, sodium 136, potassium 3.9, chloride 105, CO2 24, calcium 8.1. Total protein 6.4, albumin 3.1, bilirubin 0.5, alkaline phosphate 86, SGOT 20 and SGPT 19. Troponin less than 0.02. WBC 16.6, hemoglobin 14 and platelet count 183. Urinalysis is positive with 16 WBCs and 2+ leukocyte.   Chest x-ray, portable, showed  right perihilar density, differential consideration of focal infiltrate versus more ominous etiology such as mass and atelectasis in the lower differential consideration. evaluation is status post appropriate therapeutic regimen is recommended. If finding persistent, suggest CT of the chest.   ASSESSMENT AND PLAN:  An 81 year old male with the past medical history of coronary artery disease, hypertension, hyperlipidemia and diabetes, who lives in an assisted living facility, came to today because of some weakness and shaking with low-grade fever. X-ray found having pneumonia and urinalysis is positive for infection.  1.  Pneumonia:  We will give him Rocephin and azithromycin and we will send blood cultures and influenza antigens.  2.  Urinary tract infection:  We will send urine culture and he is on Rocephin for pneumonia, will cover for urinary tract infection also. The patient had elevated white cell count and low-grade fever with atrial fibrillation, but his oxygen saturation is fine on room air as checked by me. He does not meet the criteria of systemic inflammatory response syndrome at this time but he has generalized weakness and is not able to walk with his walker, so we will need to make arrangement for his physical therapy and rehab.  3.  Generalized weakness:  This is due to infection this time. We will do physical therapy evaluation due his old age.  4.  Atrial fibrillation:  This appears to be new onset just due to infection. We will treat underlying cause. He is on aspirin for coronary artery disease. We will continue the same at this time.  5.  Coronary artery disease and status post coronary artery bypass graft:  We will continue his aspirin, metoprolol, statin. 6.  Diabetes:  Currently because of infection, we will just cover him with insulin sliding scale coverage and we will hold his oral hypoglycemic agent.  7.  Hypertension:  Due to infection and blood pressure is running on normal side.  We will just continue low-dose beta blocker, but we will stop his amlodipine and high-dose beta blocker.   TOTAL TIME SPENT ON THIS ADMISSION:  50 minutes.  ____________________________ Hope PigeonVaibhavkumar G. Elisabeth PigeonVachhani, MD vgv:jm D: 01/29/2013 11:05:32 ET T: 01/29/2013 11:20:03 ET JOB#: [id number]  cc: Hope PigeonVaibhavkumar G. Elisabeth PigeonVachhani, MD, <Dictator> Altamese DillingVAIBHAVKUMAR Michaeljohn Biss MD ELECTRONICALLY SIGNED 01/29/2013 18:41

## 2014-08-27 NOTE — Consult Note (Signed)
PATIENT NAME:  Martin [name], Martin [name] MR#:  [id number] DATE OF BIRTH:  [birth year]  DATE OF CONSULTATION:  09/12/2013  REFERRING PHYSICIAN: Sital P. Juliene PinaMody, MD CONSULTING PHYSICIAN:  Lamar BlinksBruce J. Laural Eiland, MD  REASON FOR CONSULTATION: Acute systolic dysfunction, heart failure, elevated troponin, diabetes, hypertension and hyperlipidemia.   CHIEF COMPLAINT: "I've been real short of breath."   HISTORY OF PRESENT ILLNESS: This is an 81 year old male with known coronary artery disease status post coronary artery bypass graft, diabetes, hypertension, hyperlipidemia with acute onset of abdominal discomfort and as well as weight gain and lower extremity edema with shortness of breath consistent with acute systolic dysfunction and congestive heart failure. Chest x-ray has showed heart failure and he has a BNP of 2073. The patient has had an EKG currently showing no acute myocardial infarction. Troponin is pending. The patient had some improvements with oxygenation and Lasix. The remainder of review of systems negative for vision change, ringing in the ears, hearing loss, heartburn, nausea, vomiting, diarrhea, bloody stool, stomach pain, extremity pain, leg weakness, cramping of the buttocks, known blood clots, headaches, blackouts, dizzy spells, nosebleeds, congestion, trouble swallowing, frequent urination, urination at night, muscle weakness, numbness, anxiety, depression, skin lesions or skin rashes.   PAST MEDICAL HISTORY: 1. Known diastolic dysfunction heart failure.  2. Elevated glucose and diabetes.  3. Hypertension.  4. Coronary artery disease status post coronary artery bypass graft.   FAMILY HISTORY: The patient has no evidence of family history of cardiovascular disease or hypertension.   SOCIAL HISTORY: Currently denies alcohol or tobacco use.   ALLERGIES: As listed.   MEDICATIONS: As listed.   PHYSICAL EXAMINATION: VITAL SIGNS: Blood pressure is 136/68 bilaterally, heart rate 72 upright, reclining,  and regular.  GENERAL: He is a well-appearing large male in no acute distress.  HEENT: No icterus, thyromegaly, ulcers, hemorrhage, or xanthelasma.  CARDIOVASCULAR: Regular rate and rhythm. Normal S1, S2 with a 2/6 apical murmur consistent with mitral regurgitation. PMI is diffuse. Carotid upstroke normal without bruit. Jugular venous pressure is slightly elevated.  LUNGS: Have bibasilar decreased breath sounds and rales with a few expiratory wheezes.  ABDOMEN: Soft, nontender. Cannot assess hepatosplenomegaly or masses due to increased abdominal girth.  EXTREMITIES: Show 2+ radial, femoral, trace dorsal pedal pulses, with 1+ to 2+ lower extremity edema. No cyanosis, clubbing or ulcers.  NEUROLOGIC: He is oriented to time, place, and person, with normal mood and affect.   ASSESSMENT: An 81 year old male with hypertension, hyperlipidemia, coronary artery disease with acute systolic dysfunction, congestive heart failure, pulmonary edema and elevated BNP.   RECOMMENDATIONS: 1. Intravenous Lasix for further treatment of congestive heart failure.  2. Serial ECG and enzymes to assess for possible myocardial infarction.  3. Echocardiogram for extensive left ventricular systolic dysfunction and valvular heart disease contributing to heart failure.  4. Further consideration of ACE inhibitor/beta blocker for congestive heart failure and systolic dysfunction or valvular heart disease/ 5. Further investigation of other causes consideration of cardiac catheterization if the patient has a subendocardial myocardial infarction.    ____________________________ Lamar BlinksBruce J. Ole Lafon, MD bjk:lm D: 09/12/2013 17:23:00 ET T: 09/12/2013 23:19:53 ET JOB#: [id number]  cc: Lamar BlinksBruce J. Osmani Kersten, MD, <Dictator> Lamar BlinksBRUCE J Suzanne Garbers MD ELECTRONICALLY SIGNED 09/13/2013 8:51

## 2014-08-27 NOTE — H&P (Signed)
PATIENT NAME:  Martin [name], Martin [name] MR#:  [id number] DATE OF BIRTH:  July 31, 1921  DATE OF ADMISSION:  09/12/2013  PRIMARY CARE PHYSICIAN: Dr. Sherrie Mustache   CHIEF COMPLAINT: Shortness of breath, wheezing.   HISTORY OF PRESENT ILLNESS: This is a very pleasant, 81 year old male with a history of coronary artery disease, status post 3-vessel CABG, atrial fibrillation, diabetes, who presents with the above complaint. Over the past 2 weeks, he patient had increasing lower extremity edema, shortness of breath. However, over the past 3 to 4 days, it just worsened. He has significant dyspnea on exertion and lower extremity edema. In the ER, he was noted to be 88% on room air. Chest x-ray is consistent with mild congestive heart failure. BNP is elevated. He was given Lasix and has diuresed 300 mL.   REVIEW OF SYSTEMS:   CONSTITUTIONAL: No fever. Positive fatigue, weakness. EYES: No blurry or double vision, no glaucoma. ENT: No ear pain, hearing loss, seasonal allergies. RESPIRATORY: Positive cough, positive wheezing. No history of COPD. No hemoptysis. Positive dyspnea.  CARDIAC: Positive orthopnea and PND. No palpitations, syncope. Positive lower extremity edema. Positive history of atrial fibrillation. Positive dyspnea on exertion.  GASTROINTESTINAL: No nausea, vomiting, diarrhea, abdominal pain, melena, or ulcers. GENITOURINARY: Positive hesitancy and urgency.  ENDOCRINE: No polyuria or polydipsia.  HEMATOLOGIC/LYMPHATIC:  Positive easy bruising. SKIN:  No rash or lesions. MUSCULOSKELETAL:  No gout.   NEUROLOGIC:  No history of CVA, TIA, or seizures.  PSYCHIATRIC: No history of anxiety or depression.  PAST MEDICAL HISTORY: 1.  CAD, status post 3-vessel CABG.  2.  Cataracts.  3.  Pyelonephritis.  4.  Herpes zoster.  5.  BPH.  6.  Obesity. 7.  Osteoarthritis .  8.  Hyperlipidemia.  9.  Peripheral vascular disease.  10.  Ischemic cardiomyopathy.  11.  Atrial fibrillation.  12.  Hyperlipidemia.  13.   Hypertension.  14.  Type 2 diabetes.   SURGICAL HISTORY: 1. CABG, 3-vessel.  2. Cataract surgery.  3.  Knee replacement in the left knee.  4.  Wrist surgery.   ALLERGIES: No known drug allergies.   MEDICATIONS: 1. Terazosin 2 mg daily.  2.  Triazolam 0.25 mg 1 to 2 tablets at bedtime.  3.  Potassium chloride 10 mEq daily.  4.  Aspirin 325 daily. 5.  Norvasc 10 mg daily.  6.  Cilostazol 100 mg 2 tablets b.i.d.  7.  Metoprolol 100 mg daily.  8.  Glimepiride 2 mg b.i.d.  9.  Pravastatin 20 mg daily.  10.  Tamsulosin 0.4 mg daily.    SOCIAL HISTORY: No tobacco, alcohol, or drug use.    FAMILY HISTORY: He does not recall his family history.   PHYSICAL EXAMINATION: VITAL SIGNS: Temperature 98, pulse is 60, respirations 26, blood pressure 147/68, 95% on 2 liters, 88% on room air.  GENERAL: The patient is in moderate distress, sitting up, is unable to lie flat.  HEENT: Head is atraumatic. Pupils are round. Sclerae anicteric. Mucous membranes are moist.  Oropharynx is clear.  NECK: Very short. I did not appreciate enlarged thyroid.  CARDIOVASCULAR: Distant heart sounds. Irregular-irregular, without any obviously rubs. PMI is hard to palpate due to body habitus.  LUNGS: Bilateral wheezing. No prolonged expiration. No crackles, rales. Normal chest expansion, but he is tachypneic.  ABDOMEN: Bowel sounds are positive. Nontender, slightly distended. Unable to appreciate fluid wave. Unable to appreciate hepatosplenomegaly due to body habitus.  EXTREMITIES: With 3+ lower extremity edema.  NEUROLOGIC:  Cranial nerves II through XII were  grossly intact. No focal deficits.  MUSCULOSKELETAL: With 4/5 strength bilaterally and symmetrically.   LABORATORIES: Sodium 135, potassium 4.0, chloride 104, bicarbonate 25, BUN 24, creatinine 1.19, glucose is 156. BNP is 2073. Troponin 0.06. White blood cells 11.4, hemoglobin 13.4, hematocrit 41, platelets 204.   EKG: Indeterminate rhythm with incomplete  left bundle branch block, no ST elevations.   ASSESSMENT: This is an 81 year old male with a history of coronary artery disease, status post coronary artery bypass grafting, diabetes, atrial fibrillation, who presents with the above complaint.   1. Acute respiratory failure as evident by hypoxia as well as tachypnea secondary to acute congestive heart failure exacerbation.   2.  Acute congestive heart failure exacerbation. Unknown if this is systolic or diastolic, as he had no echocardiogram since here. Likely it is systolic. I will order an echocardiogram to figure out if this is diastolic or systolic, or related to both, as he does have lower extremity edema, increase in abdominal girth. Will diurese with IV Lasix. Monitor I's and o's, daily weights. Electrolytes including sodium and creatinine, as well as potassium. Order a TSH. Continue him on his metoprolol. Further workup pending echocardiogram and his response to Lasix.   3. Atrial fibrillation. The patient's heart is irregular-irregular. Does not appear that he is on anticoagulation with Coumadin, but he is on a full dose aspirin, which we will continue. His rate is controlled. Will continue metoprolol.   4. Benign prostatic hypertrophy. Will continue with Flomax.   5.  Diabetes. The patient is on glimepiride, which we will continue, and sliding scale insulin.   6.  History of coronary artery disease and coronary artery bypass grafting. Will continue aspirin, Norvasc, metoprolol, and statin medication.   7.  Hyperlipidemia. Continue pravastatin. Check fasting lipids.   8.  Peripheral vascular disease. Will continue Pletal.  9.  Elevated troponin, likely secondary to demand ischemia from his congestive heart failure. Will continue to monitor his cardiac enzymes. If they are increasing or suggestive of a non-STEMI, will need to consult Cardiology. For now, he is on aspirin and not complaining of any chest pain.   The patient is FULL CODE  STATUS.   TIME SPENT: Approximately 55 minutes.   ____________________________ Janyth ContesSital P. Juliene PinaMody, MD spm:mr D: 09/12/2013 16:31:39 ET T: 09/12/2013 20:01:36 ET JOB#: [id number]  cc: Keatyn Luck P. Juliene PinaMody, MD, <Dictator> Demetrios Isaacsonald E. Sherrie MustacheFisher, MD  Janyth ContesSITAL P Jamareon Shimel MD ELECTRONICALLY SIGNED 09/17/2013 18:14

## 2014-08-27 NOTE — Discharge Summary (Signed)
PATIENT NAME:  Martin RosenthalSMITH, Antonyo MR#:  [id number] DATE OF BIRTH:  Nov 26, 1921  DATE OF ADMISSION:  09/12/2013 DATE OF DISCHARGE:  09/16/2013  ADMITTING DIAGNOSIS:  Shortness of breath.   DISCHARGE DIAGNOSES: 1.  Acute respiratory failure due to acute systolic congestive heart failure.  2.  Acute combined diastolic and systolic congestive heart failure.  3.  Atrial fibrillation.  Cardiology to decide regarding further long-term anticoagulation as an outpatient.  4.  Benign prostatic hypertrophy.  5.  Type 2 diabetes.  6.  Acute kidney injury.  7.  History of coronary artery disease, status post coronary artery bypass graft.  8.  Hyperlipidemia.  9.  Hypokalemia.  10.  Elevated troponin, felt to be due to demand ischemia.  11.  Right cataract.  12.  History of pyelonephritis.  13.  Herpes zoster.   14.  Osteoarthritis.   15.  Peripheral vascular disease.   16.  Hypertension.  17.  Status post left knee replacement.  18.  Status post wrist surgery. 19.  Severe aortic regurgitation.  CONSULTANTS:  Dr. Gwen PoundsKowalski.   PERTINENT LABORATORY AND EVALUATIONS:  Admitting sodium was 135, potassium 4.0, chloride 104, bicarb 25, BUN 24, creatinine 1.19, glucose 156.  BNP was 2073.  Troponin 0.06.  WBC 11.4.  EKG showed intermediate rhythm with an incomplete left bundle branch block.  Most recent creatinine on May 13th was 1.36.  Echocardiogram showed EF 25% to 30%, moderately severe decreased global left ventricular systolic function, severe aortic regurgitation, mild to moderate tricuspid regurgitation.  Cardiac cath results showed there was a 95% stenosis of the proximal LAD, first diagonal there was 40% stenosis, proximal circumflex there was 80% stenosis, ostial RCA there is 100% stenosis and graft to the mid LAD showed no evidence of disease, graft to the first obtuse marginal the graft was occluded, graft to the RPDA was normal.  HOSPITAL COURSE:  Please refer to H and P done by the admitting  physician.  The patient is an 81 year old white male with history of systolic CHF presented with shortness of breath.  The patient was treated with IV diuresis.  He was also noticed to be in atrial fibrillation.  He was seen in consultation by cardiology.  They recommended an echocardiogram and a cardiac cath which the patient underwent.  Cardiac cath results are as above.  The patient was recommended to be medically treated.  In terms of his respiratory failure, his shortness of breath improved with diuresis.  The patient is continuing to require oxygen which he will be discharged home on.  At this time, he is doing much better and is stable for discharge.  Discharge instructions for CHF given.   DISCHARGE MEDICATIONS:  Pravastatin 20 daily, aspirin 325 daily, cilostazol 100 1 tab by mouth twice daily, metoprolol succinate 100 mg 1 tab by mouth daily, triazolam 0.25 1 to 2 tabs at bedtime as needed, albuterol 2 puffs 4 times as needed, terazosin 2 mg daily, KCl 10 mEq daily, Flomax 0.4 daily, Amaryl 1 mg daily, spironolactone 25 daily, Tylenol 650 q. 4 as needed for pain or temperature, lisinopril 10 daily, Lasix 40 mg 1 tab by mouth twice daily.   HOME OXYGEN:  Yes, 2 liters nasal cannula.   DIET:  Low sodium, carbohydrate-controlled diet.   ACTIVITY:  As tolerated.   FOLLOWUP:  [facility].  [facility].  Follow with primary M.D. in 1 to 2 weeks.  The patient  to have a BMP check at the time of primary care M.D.'s visit.   TIME SPENT:  35 minutes.    ____________________________ Lacie Scotts Allena Katz, MD shp:ea D: 09/16/2013 20:34:33 ET T: 09/17/2013 02:21:54 ET JOB#: [id number]  cc: Pratt Bress H. Allena Katz, MD, <Dictator> Charise Carwin MD ELECTRONICALLY SIGNED 09/20/2013 12:11

## 2014-09-02 DIAGNOSIS — I509 Heart failure, unspecified: Secondary | ICD-10-CM | POA: Diagnosis not present

## 2014-09-02 DIAGNOSIS — I739 Peripheral vascular disease, unspecified: Secondary | ICD-10-CM | POA: Diagnosis not present

## 2014-09-02 DIAGNOSIS — R600 Localized edema: Secondary | ICD-10-CM | POA: Diagnosis not present

## 2014-09-02 DIAGNOSIS — R29898 Other symptoms and signs involving the musculoskeletal system: Secondary | ICD-10-CM | POA: Diagnosis not present

## 2014-09-02 DIAGNOSIS — M1711 Unilateral primary osteoarthritis, right knee: Secondary | ICD-10-CM | POA: Diagnosis not present

## 2014-09-04 NOTE — H&P (Signed)
PATIENT NAME:  Martin [name], Martin [name] MR#:  [id number] DATE OF BIRTH:  [birth year]  DATE OF ADMISSION:  05/17/2014  REFERRING PHYSICIAN:  Su Ley, MD   PRIMARY CARE PHYSICIAN: Mila Merry, M.D.   CHIEF COMPLAINT: Feeling poorly.  HISTORY OF PRESENT ILLNESS:  A 81 year old Caucasian gentleman with history of coronary artery disease, status post CABG, with ischemic cardiomyopathy, ejection fraction of 25%, as well as chronic respiratory failure at 2 liters nasal cannula at bedtime, presenting with generalized malaise. History is obtained from patient. as well as family, who are at bedside. They note one day duration of "feeling poorly". However according to son at bedside. he was in his usual state of health around 11 this morning, actually went to the doctor without any difficulty. They describe him having a cough for about 10 days in total with intermittent production of  white to yellow phlegm, which has been improving. No associated fevers, chills, chest pain or  worsening shortness of breath. Today, he did have an episode, aside from feeling just generally poor, stating that he had some shaking tremors as well as 1 episode of shortness of breath which was transient. He put his oxygen back on  as he is supposed to and that improved. No further complaints at this time.   REVIEW OF SYSTEMS:  CONSTITUTIONAL: Denies fevers, chills, positive for fatigue, weakness.  EYES: Denies blurry vision, double vision, eye pain. EARS:  Denies tinnitus, ear pain or hearing loss.  RESPIRATORY:  Positive for cough, shortness of breath as described above. Denies any wheezing.  CARDIOVASCULAR: Denies chest pain, palpitations. Positive for edema. GASTROINTESTINAL:  Denies nausea, vomiting, diarrhea or abdominal pain.  GENITOURINARY: Denies dysuria, hematuria.  ENDOCRINE: No nocturia or thyroid problems. HEMATOLOGIC: Denies easy bruising or bleeding.  SKIN: Denies rash or lesions.  MUSCULOSKELETAL: Denies pain in  neck, back, shoulder, knees, hips or arthritic symptoms.  NEUROLOGIC: Denies paralysis, paresthesias.   otherwise full review of systems perfromed by me is negative PAST MEDICAL HISTORY: Atrial fibrillation, coronary artery disease status post CABG with ischemic cardiomyopathy, EF of 25 to 35%, BPH, hyperlipidemia unspecified, peripheral vascular disease, chronic respiratory failure on 2 liters nasal cannula at baseline, type 2 diabetes, insulin-requiring, however uncomplicated.   SOCIAL HISTORY: No alcohol, tobacco, or drug use. Currently resides at [facility].   FAMILY HISTORY: Denies any known cardiovascular or pulmonary disorders.   ALLERGIES: No known drug allergies.   HOME MEDICATIONS: Include spironolactone 20 mg p.o. daily, acetaminophen and oxycodone 325/5 mg 1/2 tab p.o. q.6h. as needed for pain, aspirin 325 mg p.o. daily, lisinopril 10 mg p.o. daily, terazosin 2 mg p.o. daily, glimepiride 2 mg p.o. b.i.d., Januvia 100 mg p.o. daily, Lantus 12 units subcutaneous daily, metformin 500 mg p.o. daily, extended release, pravastatin 20 mg p.o. at bedtime, triazolam 0.25 mg 2 tablets p.o. at bedtime, metoprolol 100 mg 1/2 tablet p.o. daily, Lasix 40 mg p.o. b.i.d.   PHYSICAL EXAMINATION:  VITAL SIGNS: Temperature 98.3, heart rate 80, respirations 24, blood pressure 158/76, saturating 96% on 2 liters nasal cannula. Weight 101.2 kg, BMI 30.3.  GENERAL: Chronically ill-appearing Caucasian gentleman, currently in no acute distress.  HEAD: Normocephalic, atraumatic.  EYES: Pupils equal, round and reactive to light. Extraocular muscles intact. No scleral icterus.  MOUTH: Dry mucosal membranes, poor dentition. No abscess noted. EARS, NOSE AND THROAT:  Clear without exudates. Nose no lesions.   NECK: Supple. No thyromegaly. No JVD.  PULMONARY: Diminished breath sounds throughout all lung fields secondary  to poor respiratory effort. However, no frank wheezes, rales, or rhonchi. No use of  accessory muscles. Not tachypneic. Poor respiratory effort as stated above.  CHEST: Nontender to palpation.  CARDIOVASCULAR: S1, S2, irregular rate, irregular rhythm. No murmurs, rubs, or gallops. Trace pedal edema to shins bilaterally. Pedal pulses 2+ bilaterally.  GASTROINTESTINAL: Soft, nontender, nondistended. No masses. Bowel sounds are present.  MUSCULOSKELETAL: No swelling, clubbing, or edema. Range of motion full in all extremities.  NEUROLOGIC: Cranial nerves 2 through 12 intact. No gross focal neurologic deficits. Sensation intact. Reflexes intact.  SKIN:  No ulcerations, lesions, rashes, skin turgor intact.  PSYCHIATRIC:  Mood and affect within normal limits. Patient awake, alert and oriented x3. insight and judgement intact  LABORATORY DATA: Sodium 140, potassium 4.7, chloride 105, bicarbonate 27, BUN 54, creatinine 2.74, glucose of 227. LFTs are within normal limits. Troponin less than 0.02. WBC 22.4, hemoglobin 13.1, platelets of 263,000.   URINALYSIS: WBCs 120, RBCs 1, leukocyte esterase 3+, nitrite negative, epithelial cells none.   EKG performed reveals atrial fibrillation without ST or T-wave abnormalities.   Chest x-ray performed reveals cardiomegaly and some pulmonary vascular congestion which is also stable.   ASSESSMENT AND PLAN: A 81 year old Caucasian gentleman with history of coronary artery disease, status post coronary artery bypass graft with ischemic cardiomyopathy, ejection fraction of 25% with chronic respiratory failure with 2 liters nasal cannula at baseline, presenting with generalized malaise, found to have acute kidney injury.  1. Acute kidney injury on chronic kidney disease. Will provide IV fluid hydration with gentle given low ejection fraction. Follow urine output and renal function, hold nephrotoxic agents, including his diuretics and lisinopril. 2. Urinary tract infection,  site unspecified on initial evaluation. Antibiotic coverage with ceftriaxone,  already received in the Emergency Department. Continue this.  3. Type 2 diabetes, insulin-requiring, however, uncomplicated. Hold p.o. agents. Add insulin sliding scale, with q.6 h. Accu-Cheks as well as continue his baseline Lantus.  4. Coronary artery disease status post coronary artery bypass grafting with ischemic cardiomyopathy, ejection fraction of 25 to 30%. Hold diuretics, as well as lisinopril given his acute kidney injury. Otherwise, continue the rest of his medications including beta blockade and aspirin.  5. Benign prostatic hypertrophy. Hytrin.  6. Pulmonary embolus and deep vein thrombosis prophylaxis. Heparin subcutaneous.  CODE STATUS: The patient is full code as discussed with the patient and family at bedside.   TIME SPENT: 45 minutes.     ____________________________ Cletis Athensavid K. Hower, MD dkh:kl D: 05/17/2014 21:38:45 ET T: 05/17/2014 22:18:20 ET JOB#: [id number]  cc: Cletis Athensavid K. Hower, MD, <Dictator> DAVID Synetta ShadowK HOWER MD ELECTRONICALLY SIGNED 05/18/2014 23:52

## 2014-09-04 NOTE — Discharge Summary (Signed)
PATIENT NAME:  Martin [name], Martin [name] MR#:  [id number] DATE OF BIRTH:  09/22/1921  DATE OF ADMISSION:  05/17/2014 DATE OF DISCHARGE:  05/20/2014  PRIMARY CARE PHYSICIAN: Demetrios Isaacs. Sherrie Mustache, MD  FINAL DIAGNOSES: 1.  Acute renal failure on chronic kidney disease, stage III.  2.  Cystitis without hematuria.  3.  Hypertension.  4.  Type 2 diabetes.   5.  BPH.  6.  Hyperlipidemia, unspecified.  7.  Cardiomyopathy.   MEDICATIONS ON DISCHARGE: Include pravastatin 20 mg at bedtime, aspirin 325 mg daily, terazosin 2 mg daily, triazolam 0.25 mg 2 tablets at bedtime, acetaminophen oxycodone 325/5 one tablet every 6 hours as needed, Januvia dose decreased to 50 mg daily, metoprolol ER 25 mg daily, furosemide 40 mg 1 tablet in the a.m. can take a second tablet in the afternoon if short of breath or weight gain of 2 pounds, cephalexin 250 mg every 8 hours for 3 more days, hydrocortisone topical applied to affected area on leg twice a day for 15 days. Stop taking glimepiride. Stop taking the Lantus. Stop taking lisinopril. Stop taking metformin. Stop taking spironolactone.   HOME HEALTH: He has physical therapy, nurse, and nurse aide and daily weights recommended.   DIET: Low sodium, carbohydrate-controlled diet, regular consistency.   ACTIVITY: As tolerated.   FOLLOWUP: In 1 to 2 weeks with Dr. Sherrie Mustache.   HOSPITAL COURSE: The patient was admitted 05/17/2014 and discharged 05/20/2014. He came in with feeling poorly, admitted with acute renal failure on chronic kidney disease, and was started on IV fluid hydration, was admitted with acute cystitis without hematuria, started on ceftriaxone.   LABORATORY AND RADIOLOGICAL DATA DURING THE HOSPITAL COURSE: EKG showed atrial fibrillation, left axis deviation, and troponin negative. Glucose 227, BUN 54, creatinine 2.74, sodium 140, potassium 4.7, chloride 105, CO2 of 27, calcium 8.7. Liver function tests normal range. White blood cell count 22.4, hemoglobin and hematocrit  13.1 and 40.2, platelet count of 263,000. Chest x-ray showed stable cardiomegaly. Urine culture grew out greater than 100,000 Proteus mirabilis sensitive to the ceftriaxone that the patient was put on. Urinalysis positive 3+ leukocyte esterase, 1+ bacteria. Blood cultures were negative. Creatinine on the 13th came down to 2.57. Creatinine on the 14th came down to 2.16. Creatinine on the 15th came down to 1.79.   HOSPITAL COURSE PER PROBLEM LIST:  1.  For the patient's acute renal failure on chronic kidney disease, the patient was given IV fluid hydration. With the patient's history of low EF, I did put the patient back on Lasix to go home with, but a smaller dose of 40 mg and only as needed a second time in the afternoon for weight gain or shortness of breath. The patient was eating very well upon discharge.  2.  Cystitis without hematuria. Urine culture grew out Proteus. Was on IV Rocephin here and it was sensitive to that. I switched over to cephalexin for a few more days.  3.  Hypertension. Blood pressure was on the lower side. I got rid of the lisinopril and the spironolactone and had to cut back on the dose of the metoprolol.  4.  Type 2 diabetes. Sugars were on the lower side, probably with chronic kidney disease. Stop the Lantus and the glimepiride and the metformin. I renally dosed the Januvia at 50 mg daily. Follow up as outpatient with that.  5.  BPH, on terazosin.  6.  Hyperlipidemia, on pravastatin.  7.  Cardiomyopathy with a low ejection fraction. Close clinical followup as  outpatient. Daily weights recommended.   I do recommend checking a BMP in 1 week with the result to go to Dr. Sherrie MustacheFisher and adjustment of medications based on kidney function and blood pressure.   TIME SPENT ON DISCHARGE: 35 minutes.    ____________________________ Herschell Dimesichard J. Renae GlossWieting, MD rjw:at D: 05/20/2014 15:25:18 ET T: 05/20/2014 15:59:32 ET JOB#: [id number]  cc: Herschell Dimesichard J. Renae GlossWieting, MD, <Dictator> Demetrios Isaacsonald E.  Sherrie MustacheFisher, MD Salley ScarletICHARD J Sania Noy MD ELECTRONICALLY SIGNED 05/24/2014 8:32

## 2014-09-10 ENCOUNTER — Observation Stay
Admission: EM | Admit: 2014-09-10 | Discharge: 2014-09-12 | Payer: Medicare Other | Attending: Specialist | Admitting: Specialist

## 2014-09-10 ENCOUNTER — Emergency Department: Payer: Medicare Other

## 2014-09-10 DIAGNOSIS — G8929 Other chronic pain: Secondary | ICD-10-CM | POA: Insufficient documentation

## 2014-09-10 DIAGNOSIS — N183 Chronic kidney disease, stage 3 (moderate): Secondary | ICD-10-CM | POA: Diagnosis not present

## 2014-09-10 DIAGNOSIS — W1830XA Fall on same level, unspecified, initial encounter: Secondary | ICD-10-CM | POA: Diagnosis not present

## 2014-09-10 DIAGNOSIS — S40012A Contusion of left shoulder, initial encounter: Secondary | ICD-10-CM

## 2014-09-10 DIAGNOSIS — M25531 Pain in right wrist: Secondary | ICD-10-CM | POA: Diagnosis not present

## 2014-09-10 DIAGNOSIS — Z794 Long term (current) use of insulin: Secondary | ICD-10-CM | POA: Diagnosis not present

## 2014-09-10 DIAGNOSIS — S60211A Contusion of right wrist, initial encounter: Secondary | ICD-10-CM

## 2014-09-10 DIAGNOSIS — E785 Hyperlipidemia, unspecified: Secondary | ICD-10-CM | POA: Diagnosis not present

## 2014-09-10 DIAGNOSIS — R739 Hyperglycemia, unspecified: Secondary | ICD-10-CM | POA: Diagnosis not present

## 2014-09-10 DIAGNOSIS — R079 Chest pain, unspecified: Secondary | ICD-10-CM | POA: Insufficient documentation

## 2014-09-10 DIAGNOSIS — S4992XA Unspecified injury of left shoulder and upper arm, initial encounter: Secondary | ICD-10-CM | POA: Diagnosis not present

## 2014-09-10 DIAGNOSIS — E860 Dehydration: Secondary | ICD-10-CM

## 2014-09-10 DIAGNOSIS — Z7982 Long term (current) use of aspirin: Secondary | ICD-10-CM | POA: Diagnosis not present

## 2014-09-10 DIAGNOSIS — I255 Ischemic cardiomyopathy: Secondary | ICD-10-CM | POA: Insufficient documentation

## 2014-09-10 DIAGNOSIS — N390 Urinary tract infection, site not specified: Secondary | ICD-10-CM | POA: Diagnosis not present

## 2014-09-10 DIAGNOSIS — I129 Hypertensive chronic kidney disease with stage 1 through stage 4 chronic kidney disease, or unspecified chronic kidney disease: Secondary | ICD-10-CM | POA: Insufficient documentation

## 2014-09-10 DIAGNOSIS — I482 Chronic atrial fibrillation: Secondary | ICD-10-CM | POA: Insufficient documentation

## 2014-09-10 DIAGNOSIS — Z79899 Other long term (current) drug therapy: Secondary | ICD-10-CM | POA: Diagnosis not present

## 2014-09-10 DIAGNOSIS — M25512 Pain in left shoulder: Secondary | ICD-10-CM | POA: Diagnosis not present

## 2014-09-10 DIAGNOSIS — I429 Cardiomyopathy, unspecified: Secondary | ICD-10-CM | POA: Insufficient documentation

## 2014-09-10 DIAGNOSIS — E1140 Type 2 diabetes mellitus with diabetic neuropathy, unspecified: Secondary | ICD-10-CM | POA: Diagnosis not present

## 2014-09-10 DIAGNOSIS — E1165 Type 2 diabetes mellitus with hyperglycemia: Secondary | ICD-10-CM | POA: Diagnosis not present

## 2014-09-10 DIAGNOSIS — N400 Enlarged prostate without lower urinary tract symptoms: Secondary | ICD-10-CM | POA: Insufficient documentation

## 2014-09-10 DIAGNOSIS — I5022 Chronic systolic (congestive) heart failure: Secondary | ICD-10-CM | POA: Diagnosis not present

## 2014-09-10 DIAGNOSIS — W19XXXA Unspecified fall, initial encounter: Secondary | ICD-10-CM

## 2014-09-10 DIAGNOSIS — I2510 Atherosclerotic heart disease of native coronary artery without angina pectoris: Secondary | ICD-10-CM | POA: Diagnosis not present

## 2014-09-10 DIAGNOSIS — S6991XA Unspecified injury of right wrist, hand and finger(s), initial encounter: Secondary | ICD-10-CM | POA: Diagnosis not present

## 2014-09-10 DIAGNOSIS — Z9181 History of falling: Secondary | ICD-10-CM | POA: Diagnosis not present

## 2014-09-10 HISTORY — DX: Benign prostatic hyperplasia without lower urinary tract symptoms: N40.0

## 2014-09-10 HISTORY — DX: Hyperlipidemia, unspecified: E78.5

## 2014-09-10 HISTORY — DX: Heart failure, unspecified: I50.9

## 2014-09-10 HISTORY — DX: Atherosclerotic heart disease of native coronary artery without angina pectoris: I25.10

## 2014-09-10 HISTORY — DX: Unspecified atrial fibrillation: I48.91

## 2014-09-10 HISTORY — DX: Type 2 diabetes mellitus without complications: E11.9

## 2014-09-10 LAB — URINALYSIS COMPLETE WITH MICROSCOPIC (ARMC ONLY)
BILIRUBIN URINE: NEGATIVE
Glucose, UA: >500 mg/dL — AB
Ketones, ur: NEGATIVE mg/dL
NITRITE: NEGATIVE
PH: 5 (ref 5.0–8.0)
PROTEIN: NEGATIVE mg/dL
Specific Gravity, Urine: 1.025 (ref 1.005–1.030)

## 2014-09-10 LAB — CBC
HEMATOCRIT: 42.4 % (ref 40.0–52.0)
HEMATOCRIT: 39.1 % — AB (ref 40.0–52.0)
HEMOGLOBIN: 14.1 g/dL (ref 13.0–18.0)
Hemoglobin: 13.3 g/dL (ref 13.0–18.0)
MCH: 30.7 pg (ref 26.0–34.0)
MCH: 31.4 pg (ref 26.0–34.0)
MCHC: 33.4 g/dL (ref 32.0–36.0)
MCHC: 34.1 g/dL (ref 32.0–36.0)
MCV: 91.9 fL (ref 80.0–100.0)
MCV: 92 fL (ref 80.0–100.0)
PLATELETS: 219 10*3/uL (ref 150–440)
Platelets: 227 10*3/uL (ref 150–440)
RBC: 4.61 MIL/uL (ref 4.40–5.90)
RBC: 4.25 MIL/uL — ABNORMAL LOW (ref 4.40–5.90)
RDW: 13.4 % (ref 11.5–14.5)
RDW: 12.8 % (ref 11.5–14.5)
WBC: 9.9 10*3/uL (ref 3.8–10.6)
WBC: 9.9 10*3/uL (ref 3.8–10.6)

## 2014-09-10 LAB — GLUCOSE, CAPILLARY
Glucose-Capillary: 413 mg/dL — ABNORMAL HIGH (ref 70–99)
Glucose-Capillary: 415 mg/dL — ABNORMAL HIGH (ref 70–99)
Glucose-Capillary: 353 mg/dL — ABNORMAL HIGH (ref 70–99)
Glucose-Capillary: 304 mg/dL — ABNORMAL HIGH (ref 70–99)

## 2014-09-10 LAB — BASIC METABOLIC PANEL
Anion gap: 9 (ref 5–15)
BUN: 38 mg/dL — ABNORMAL HIGH (ref 6–20)
CALCIUM: 8.8 mg/dL — AB (ref 8.9–10.3)
CO2: 29 mmol/L (ref 22–32)
Chloride: 96 mmol/L — ABNORMAL LOW (ref 101–111)
Creatinine, Ser: 1.7 mg/dL — ABNORMAL HIGH (ref 0.61–1.24)
GFR calc non Af Amer: 34 mL/min — ABNORMAL LOW (ref >60)
GFR, EST AFRICAN AMERICAN: 39 mL/min — AB (ref >60)
Glucose, Bld: 548 mg/dL (ref 65–99)
POTASSIUM: 4.1 mmol/L (ref 3.5–5.1)
SODIUM: 134 mmol/L — AB (ref 135–145)

## 2014-09-10 LAB — CREATININE, SERUM
Creatinine, Ser: 1.55 mg/dL — ABNORMAL HIGH (ref 0.61–1.24)
GFR calc Af Amer: 44 mL/min — ABNORMAL LOW (ref >60)
GFR calc non Af Amer: 38 mL/min — ABNORMAL LOW (ref >60)

## 2014-09-10 LAB — TROPONIN I
Troponin I: <0.03 ng/mL (ref <0.031)
Troponin I: <0.03 ng/mL (ref <0.031)

## 2014-09-10 LAB — CKMB (ARMC ONLY): CK, MB: 3.6 ng/mL (ref 0.5–5.0)

## 2014-09-10 MED ORDER — ASPIRIN EC 325 MG PO TBEC
325.0000 mg | DELAYED_RELEASE_TABLET | Freq: Every day | ORAL | Status: DC
  Administered 2014-09-10 – 2014-09-12 (×3): 325 mg via ORAL
  Filled 2014-09-10 (×3): qty 1

## 2014-09-10 MED ORDER — ACETAMINOPHEN 325 MG PO TABS
650.0000 mg | ORAL_TABLET | Freq: Four times a day (QID) | ORAL | Status: DC | PRN
  Administered 2014-09-10: 650 mg via ORAL
  Filled 2014-09-10: qty 2

## 2014-09-10 MED ORDER — DOCUSATE SODIUM 100 MG PO CAPS
100.0000 mg | ORAL_CAPSULE | Freq: Two times a day (BID) | ORAL | Status: DC
  Administered 2014-09-10 – 2014-09-12 (×5): 100 mg via ORAL
  Filled 2014-09-10 (×5): qty 1

## 2014-09-10 MED ORDER — ACETAMINOPHEN 325 MG PO TABS
ORAL_TABLET | ORAL | Status: AC
  Filled 2014-09-10: qty 2

## 2014-09-10 MED ORDER — INSULIN GLARGINE 100 UNIT/ML ~~LOC~~ SOLN
5.0000 [IU] | Freq: Every day | SUBCUTANEOUS | Status: DC
  Administered 2014-09-10 – 2014-09-11 (×2): 5 [IU] via SUBCUTANEOUS
  Filled 2014-09-10 (×5): qty 0.05

## 2014-09-10 MED ORDER — DEXTROSE 5 % IV SOLN
1.0000 g | INTRAVENOUS | Status: DC
  Administered 2014-09-11: 1 g via INTRAVENOUS
  Filled 2014-09-10 (×2): qty 10

## 2014-09-10 MED ORDER — INSULIN ASPART 100 UNIT/ML ~~LOC~~ SOLN
0.0000 [IU] | Freq: Three times a day (TID) | SUBCUTANEOUS | Status: DC
  Administered 2014-09-10: 12 [IU] via SUBCUTANEOUS
  Administered 2014-09-10: 9 [IU] via SUBCUTANEOUS
  Administered 2014-09-11: 3 [IU] via SUBCUTANEOUS
  Administered 2014-09-11: 9 [IU] via SUBCUTANEOUS
  Administered 2014-09-11 – 2014-09-12 (×2): 7 [IU] via SUBCUTANEOUS
  Filled 2014-09-10: qty 7
  Filled 2014-09-10: qty 3
  Filled 2014-09-10: qty 7
  Filled 2014-09-10 (×2): qty 9
  Filled 2014-09-10: qty 12

## 2014-09-10 MED ORDER — SODIUM CHLORIDE 0.9 % IV BOLUS (SEPSIS)
1000.0000 mL | Freq: Once | INTRAVENOUS | Status: AC
  Administered 2014-09-10: 1000 mL via INTRAVENOUS

## 2014-09-10 MED ORDER — DOCUSATE SODIUM 100 MG PO TABS
100.0000 mg | ORAL_TABLET | Freq: Two times a day (BID) | ORAL | Status: DC
  Filled 2014-09-10 (×10): qty 1

## 2014-09-10 MED ORDER — GABAPENTIN 300 MG PO CAPS
300.0000 mg | ORAL_CAPSULE | Freq: Three times a day (TID) | ORAL | Status: DC
  Administered 2014-09-10 – 2014-09-12 (×7): 300 mg via ORAL
  Filled 2014-09-10 (×7): qty 1

## 2014-09-10 MED ORDER — GLIMEPIRIDE 2 MG PO TABS
2.0000 mg | ORAL_TABLET | Freq: Every day | ORAL | Status: DC
  Administered 2014-09-11 – 2014-09-12 (×2): 2 mg via ORAL
  Filled 2014-09-10 (×2): qty 1

## 2014-09-10 MED ORDER — MORPHINE SULFATE 2 MG/ML IJ SOLN: 1.0000 mg | Freq: Once | INTRAMUSCULAR | Status: AC | PRN

## 2014-09-10 MED ORDER — CEFTRIAXONE SODIUM 1 G IJ SOLR
1.0000 g | Freq: Once | INTRAMUSCULAR | Status: AC
  Administered 2014-09-10: 1 g via INTRAVENOUS

## 2014-09-10 MED ORDER — ACETAMINOPHEN 325 MG PO TABS
650.0000 mg | ORAL_TABLET | Freq: Once | ORAL | Status: AC
  Administered 2014-09-10: 650 mg via ORAL

## 2014-09-10 MED ORDER — NITROGLYCERIN 0.4 MG SL SUBL
0.4000 mg | SUBLINGUAL_TABLET | SUBLINGUAL | Status: DC | PRN
  Administered 2014-09-10 – 2014-09-11 (×3): 0.4 mg via SUBLINGUAL
  Filled 2014-09-10 (×3): qty 1

## 2014-09-10 MED ORDER — METOPROLOL SUCCINATE ER 25 MG PO TB24
25.0000 mg | ORAL_TABLET | Freq: Every day | ORAL | Status: DC
  Administered 2014-09-10: 25 mg via ORAL
  Filled 2014-09-10 (×2): qty 1

## 2014-09-10 MED ORDER — SENNOSIDES-DOCUSATE SODIUM 8.6-50 MG PO TABS
1.0000 | ORAL_TABLET | Freq: Every evening | ORAL | Status: DC | PRN
  Administered 2014-09-10: 1 via ORAL
  Filled 2014-09-10: qty 1

## 2014-09-10 MED ORDER — HEPARIN SODIUM (PORCINE) 5000 UNIT/ML IJ SOLN
5000.0000 [IU] | Freq: Three times a day (TID) | INTRAMUSCULAR | Status: DC
  Administered 2014-09-10 – 2014-09-12 (×7): 5000 [IU] via SUBCUTANEOUS
  Filled 2014-09-10 (×7): qty 1

## 2014-09-10 MED ORDER — CEFTRIAXONE SODIUM 1 G IJ SOLR
INTRAMUSCULAR | Status: AC
  Filled 2014-09-10: qty 10

## 2014-09-10 MED ORDER — PRAVASTATIN SODIUM 20 MG PO TABS
20.0000 mg | ORAL_TABLET | Freq: Every day | ORAL | Status: DC
  Administered 2014-09-10 – 2014-09-11 (×2): 20 mg via ORAL
  Filled 2014-09-10 (×2): qty 1

## 2014-09-10 MED ORDER — TERAZOSIN HCL 2 MG PO CAPS
2.0000 mg | ORAL_CAPSULE | Freq: Every day | ORAL | Status: DC
  Administered 2014-09-10 – 2014-09-12 (×3): 2 mg via ORAL
  Filled 2014-09-10 (×4): qty 1

## 2014-09-10 NOTE — ED Provider Notes (Addendum)
Because of the patient's history of diabetes, we obtained a blood glucose prior to discharge. This came elevated at 550. This was an unexpected finding given the patient's review of systems and no concerns are present, but because of this we'll go ahead and check labs including CBC, basic metabolic panel, and UA.  He has a known history of type 2 diabetes. Patient remained stable in no distress with no complaints at present time.  Patient signed out to Dr. Marge DuncansPaul Melinda at 7:15 AM. Plan is to follow up on urine, chemistry, CBC, specifically regarding hyperglycemia and ruling out infectious etiology for such.  Sharyn CreamerMark Littie Chiem, MD 09/10/14 [id number]  Sharyn CreamerMark Alga Southall, MD 09/10/14 [phone]

## 2014-09-10 NOTE — ED Notes (Signed)
Per lab blood glucose level 548.

## 2014-09-10 NOTE — ED Notes (Signed)
Pt presents to ED via EMS with c/o of fall from [facility]. EMS states pt was using a urinal in bedroom restroom when he fell to the floor. EMS states pt lost balance and landed on bathroom floor. EMS states pt has notable bulge located to the back of neck, unsure of origin time. EMS reports pt was alert and oriented at the time of incident. No bleeding, bruising, swelling, or notable physical deformities noted upon entrance into treatment room. VS per EMS listed below:  140/84 BP 998% O2 RA  64 HR

## 2014-09-10 NOTE — ED Notes (Signed)
CBG 551, MD notified.

## 2014-09-10 NOTE — ED Notes (Signed)
Blood Glucose checked value was 408.

## 2014-09-10 NOTE — Progress Notes (Signed)
Pt finger stick glu 419 called to D  Vacchanni  No orders received will recheck at 1600

## 2014-09-10 NOTE — Progress Notes (Signed)
Patient complaining of pain over his heart. VS 128/59 P 59. MD paged.

## 2014-09-10 NOTE — Progress Notes (Signed)
MD paged regarding patient complaint of chest pain. Orders received and acknowledged. Pt. Given nitrostat SL as ordered. Pt states "a little better" will continue. Vitals remain stable. Pt is not diaphoretic or short of breath. EKG and cardiac enzymes ordered as well. Will continue to monitor.

## 2014-09-10 NOTE — Progress Notes (Signed)
  Patient reported to ED from [facility], ED-CSW in to assess patient as he is from an ALF facility.  [facility] with Home Health, Physical Therapy and a nurse.  CSW unable to obtain assessment inform patient while in ED, patient was sleep, but easily wake, however patient is hard of hearing and did not answer any questions.  [phone] for collateral information on patient. No answer.  CSW on medical floor will assist patient if psycho social services are needed.    Sammuel Hineseborah Kelse Ploch. Theresia MajorsLCSWA, MSW Clinical Social Work Department Emergency Room [phone] 11:43 AM

## 2014-09-10 NOTE — H&P (Signed)
[facility] - [place] at [facility]   PATIENT NAME: Martin [name]    MR#:  [id number]  DATE OF BIRTH:  1922/01/12  DATE OF ADMISSION:  09/10/2014  PRIMARY CARE PHYSICIAN: Mila Merry, MD   REQUESTING/REFERRING PHYSICIAN: Dr.Quale  CHIEF COMPLAINT:   Chief Complaint  Patient presents with  . Fall    HISTORY OF PRESENT ILLNESS: Martin [name]  is a 81 y.o. male with a known history of coronary artery disease , CHF , hyperlipidemia, ejection fraction 25%, diabetes and lives in a facility says that for last few days he has increased frequency of the urine. He has hearing deficit and appears to have some dementia so not a very good historian. I tried calling patient's daughter Ms. [name] for further information twice but was not successful to talk. He says that in the night he was trying to go to the bathroom and fell down, having complained of left shoulder and right wrist pain came to emergency room. In the ER x-rays are negative for any injuries but patient was noted to have UTI and her sugar level more than 500 so given to the hospitalist team for admission.   PA with.ST MEDICAL HISTORY:   Past Medical History  Diagnosis Date  . Coronary artery disease   . CHF (congestive heart failure)     ischemic cardiomyopathy- EF 25%  . Hyperlipidemia   . Diabetes mellitus without complication   . Atrial fibrillation   . BPH (benign prostatic hyperplasia)     PAST SURGICAL HISTORY: History reviewed. No pertinent past surgical history. CABG.  SOCIAL HISTORY:  History  Substance Use Topics  . Smoking status: Never Smoker   . Smokeless tobacco: Not on file  . Alcohol Use: No    FAMILY HISTORY: History reviewed. No pertinent family history.  DRUG ALLERGIES: No Known Allergies  REVIEW OF SYSTEMS:   Pt is having hearing deficit and some confusion.  MEDICATIONS AT HOME:  Prior to Admission medications   Medication Sig Start Date End Date Taking? Authorizing  Provider  furosemide (LASIX) 40 MG tablet Take 40 mg by mouth.    Historical Provider, MD  glimepiride (AMARYL) 2 MG tablet Take 2 mg by mouth daily with breakfast.    Historical Provider, MD  potassium chloride (K-DUR) 10 MEQ tablet Take 10 mEq by mouth daily.    Historical Provider, MD  pravastatin (PRAVACHOL) 20 MG tablet Take 20 mg by mouth daily.    Historical Provider, MD  terazosin (HYTRIN) 2 MG capsule Take 2 mg by mouth at bedtime.    Historical Provider, MD  triazolam (HALCION) 0.25 MG tablet Take 0.25 mg by mouth at bedtime as needed for sleep.    Historical Provider, MD     PHYSICAL EXAMINATION:   VITAL SIGNS: Blood pressure 167/81, pulse 67, temperature 97.6 F (36.4 C), temperature source Oral, resp. rate 18, height  (1.676 m), weight 77.111 kg (170 lb), SpO2 99 %.  GENERAL:  81 y.o.-year-old patient lying in the bed with no acute distress.  EYES: Pupils equal, round, reactive to light and accommodation. No scleral icterus. Extraocular muscles intact.  HEENT: Head atraumatic, normocephalic. Oropharynx and nasopharynx clear. Hearing aid present b/l. NECK:  Supple, no jugular venous distention. No thyroid enlargement, no tenderness.  LUNGS: Normal breath sounds bilaterally, no wheezing, rales,rhonchi or crepitation. No use of accessory muscles of respiration.  CARDIOVASCULAR: S1, S2 normal. Positive murmurs, rubs, or gallops.  ABDOMEN: Soft, nontender, nondistended. Bowel sounds present. No  organomegaly or mass.  EXTREMITIES: No pedal edema, cyanosis, or clubbing.  NEUROLOGIC: Cranial nerves II through XII are intact. Muscle strength 5/5 in all extremities. Sensation intact. Gait not checked.  PSYCHIATRIC: The patient is alert , some confused. SKIN: No obvious rash, lesion, or ulcer.   LABORATORY PANEL:   CBC  Recent Labs Lab 09/10/14 0626  WBC 9.9  HGB 14.1  HCT 42.4  PLT 227  MCV 91.9  MCH 30.7  MCHC 33.4  RDW 13.4    ------------------------------------------------------------------------------------------------------------------  Chemistries   Recent Labs Lab 09/10/14 0626  NA 134*  K 4.1  CL 96*  CO2 29  GLUCOSE 548*  BUN 38*  CREATININE 1.70*  CALCIUM 8.8*   ------------------------------------------------------------------------------------------------------------------ estimated creatinine clearance is 28.2 mL/min (by C-G formula based on Cr of 1.7). ------------------------------------------------------------------------------------------------------------------ No results for input(s): TSH, T4TOTAL, T3FREE, THYROIDAB in the last 72 hours.  Invalid input(s): FREET3   Coagulation profile No results for input(s): INR, PROTIME in the last 168 hours. ------------------------------------------------------------------------------------------------------------------- No results for input(s): DDIMER in the last 72 hours. -------------------------------------------------------------------------------------------------------------------  Cardiac Enzymes  Recent Labs Lab 09/10/14 0626  TROPONINI <0.03   ------------------------------------------------------------------------------------------------------------------ Invalid input(s): POCBNP  ---------------------------------------------------------------------------------------------------------------  Urinalysis    Component Value Date/Time   COLORURINE YELLOW* 09/10/2014 0619   APPEARANCEUR CLOUDY* 09/10/2014 0619   LABSPEC 1.025 09/10/2014 0619   PHURINE 5.0 09/10/2014 0619   GLUCOSEU >500* 09/10/2014 0619   HGBUR 1+* 09/10/2014 0619   BILIRUBINUR NEGATIVE 09/10/2014 0619   KETONESUR NEGATIVE 09/10/2014 0619   PROTEINUR NEGATIVE 09/10/2014 0619   NITRITE NEGATIVE 09/10/2014 0619   LEUKOCYTESUR 3+* 09/10/2014 0619     RADIOLOGY: Dg Wrist Complete Right  09/10/2014   CLINICAL DATA:  Generalized LEFT shoulder and RIGHT  wrist pain after fall tonight.  EXAM: RIGHT WRIST - COMPLETE 3+ VIEW  COMPARISON:  None.  FINDINGS: No acute fracture deformity or dislocation. Remote radial styloid fracture. Mild periarticular fluffy calcifications can be seen with CPPD. Moderate lateral wrist osteoarthrosis. Joint space intact without erosions. No destructive bony lesions. Soft tissue planes are not suspicious.  IMPRESSION: No acute fracture deformity or dislocation.  Remote radial styloid fracture.  Probable CPPD.   Electronically Signed   By: Awilda Metroourtnay  Bloomer   On: 09/10/2014 03:42   Dg Shoulder Left  09/10/2014   CLINICAL DATA:  Generalized shoulder and wrist pain after fall tonight.  EXAM: LEFT SHOULDER - 2+ VIEW  COMPARISON:  None.  FINDINGS: The humeral head is well-formed and located. The subacromial, glenohumeral and acromioclavicular joint spaces are intact. No destructive bony lesions. Soft tissue planes are non-suspicious. Mild vascular calcifications.  IMPRESSION: Negative.   Electronically Signed   By: Awilda Metroourtnay  Bloomer   On: 09/10/2014 03:40    EKG: Orders placed or performed during the hospital encounter of 09/10/14  . EKG 12-Lead  . EKG 12-Lead    IMPRESSION AND PLAN:  * UTI   IV rocephin.   Ur cx.  * Fall   Due to infection, Will get PT.   No fractures.  * CAD  Cont home meds.  * Hyperglycemia   Likely due to UTI.   lantus- 5 U, give Insulin sliding scale.       All the records are reviewed and case discussed with ED provider. Management plans discussed with the patient, family and they are in agreement.  CODE STATUS: full.    TOTAL TIME TAKING CARE OF THIS PATIENT: 50 minutes.    Altamese DillingVACHHANI, Alecsander Hattabaugh M.D on 09/10/2014 at 9:53  AM  Between 7am to 6pm - Pager - [phone]  After 6pm go to [email or web address] - password [email or web address] [facility]  [facility]  Office  [phone]  CC: Primary care physician; Mila MerryFISHER, DONALD, MD

## 2014-09-10 NOTE — ED Provider Notes (Signed)
[facility] Emergency Department Provider Note    ____________________________________________  Time seen: 2:30 AM  I have reviewed the triage vital signs and the nursing notes.   HISTORY  Chief Complaint Fall    HPI Martin [name] is a 81 y.o. male who reports that he was up to use the bathroom when he slipped in the bathroom and fell. He denies injury except for feeling sore over the left shoulder and his right wrist is also slightly sore. He denies head injury, there was no loss of consciousness, no chest pain, no preceding symptoms other than feeling that he slipped while attempting to use the urinal.  He describes the pain in both his shoulder and his wrist as achy, and slightly sore, but otherwise feels well. There is no numbness or tingling. He also reports that 3 days ago he slipped and did injure the left shoulder, that has been sore since the right wrist is just slightly sore.  A loss of consciousness, no head injury, no preceding symptoms. Patient recalls slipping.  Past medical history includes atrial fibrillation, he is not on anti-coagulation.  History reviewed. No pertinent past medical history.  There are no active problems to display for this patient.   History reviewed. No pertinent past surgical history.  Current Outpatient Rx  Name  Route  Sig  Dispense  Refill  . aspirin 325 MG tablet   Oral   Take 325 mg by mouth daily.         . furosemide (LASIX) 40 MG tablet   Oral   Take 40 mg by mouth.         Marland Kitchen glimepiride (AMARYL) 2 MG tablet   Oral   Take 2 mg by mouth daily with breakfast.         . metoprolol succinate (TOPROL-XL) 100 MG 24 hr tablet   Oral   Take 100 mg by mouth daily. Take with or immediately following a meal.         . potassium chloride (K-DUR) 10 MEQ tablet   Oral   Take 10 mEq by mouth daily.         . pravastatin (PRAVACHOL) 20 MG tablet   Oral   Take 20 mg by mouth daily.         Marland Kitchen  terazosin (HYTRIN) 2 MG capsule   Oral   Take 2 mg by mouth at bedtime.         . triazolam (HALCION) 0.25 MG tablet   Oral   Take 0.25 mg by mouth at bedtime as needed for sleep.           Allergies Review of patient's allergies indicates no known allergies.  History reviewed. No pertinent family history.  Social History History  Substance Use Topics  . Smoking status: Never Smoker   . Smokeless tobacco: Not on file  . Alcohol Use: No    Review of Systems  Constitutional: Negative for fever. Eyes: Negative for visual changes. ENT: Negative for sore throat. Cardiovascular: Negative for chest pain. Respiratory: Negative for shortness of breath. Gastrointestinal: Negative for abdominal pain, vomiting and diarrhea. Genitourinary: Negative for dysuria. Musculoskeletal: Negative for back pain or neck pain. Skin: Negative for rash. Neurological: Negative for headaches, focal weakness or numbness.   10-point ROS otherwise negative.  ____________________________________________   PHYSICAL EXAM:  VITAL SIGNS: ED Triage Vitals  Enc Vitals Group     BP 09/10/14 0213 160/84 mmHg     Pulse Rate  09/10/14 0213 61     Resp 09/10/14 0213 20     Temp 09/10/14 0213 97.6 F (36.4 C)     Temp Source 09/10/14 0213 Oral     SpO2 09/10/14 0213 98 %     Weight 09/10/14 0213 170 lb (77.111 kg)     Height 09/10/14 0213 5\' 6"  (1.676 m)     Head Cir --      Peak Flow --      Pain Score --      Pain Loc --      Pain Edu? --      Excl. in GC? --      Constitutional: Alert and oriented. He is notably quite hard of hearing. Well appearing and in no distress. Eyes: Conjunctivae are normal. PERRL. Normal extraocular movements. ENT   Head: Normocephalic and atraumatic.   Nose: No congestion/rhinnorhea.   Mouth/Throat: Mucous membranes are moist.   Neck: No stridor. Hematological/Lymphatic/Immunilogical: No cervical lymphadenopathy. Cardiovascular: Irregular  regular rhythm. Normal and symmetric distal pulses are present in all extremities. No murmurs, rubs, or gallops. Respiratory: Normal respiratory effort without tachypnea nor retractions. Breath sounds are clear and equal bilaterally. No wheezes/rales/rhonchi. Gastrointestinal: Soft and nontender. No distention. No abdominal bruits. There is no CVA tenderness.  Musculoskeletal: Nontender with normal range of motion in all extremities except for mild tenderness to palpation and with movement of the left upper arm over the proximal humerus but there is no associated deformity. His right wrist has full range of motion, it is slightly tender along the radial styloid, but there is no deformity or bruising.. No joint effusions.  No lower extremity tenderness nor edema. Neurologic:  Speech is slightly thick, but the patient notes that this is his normal speech. and language. No gross focal neurologic deficits are appreciated. Speech is normal. No gait instability. Skin:  Skin is warm, dry and intact. No rash noted. Psychiatric: Mood and affect are normal. Speech and behavior are normal. Patient exhibits appropriate insight and judgment. Patient is oriented well 3. He shows no evidence of altered mental status.  ____________________________________________    LABS (pertinent positives/negatives)     ____________________________________________   EKG  EKG shows atrial fibrillation rate controlled at 61 bpm QRS is 1:30 his QTC is 459 there is a left axis deviation he does have an intraventricular block, there is a T-wave depression notably in the lateral precordial leads also some T wave inversions in 1 to and aVL, review of prior EKG from 05/07/2014 reveals that these do not appear to be acute.  ____________________________________________    RADIOLOGY  No acute fractures of the left shoulder or right wrist.  ____________________________________________   PROCEDURES  Procedure(s) performed:  None  Critical Care performed: No  ____________________________________________   INITIAL IMPRESSION / ASSESSMENT AND PLAN / ED COURSE  Pertinent labs & imaging results that were available during my care of the patient were reviewed by me and considered in my medical decision making (see chart for details).  Patient history appears most consistent with a mechanical fall, there is no evidence of head trauma, there was no loss of consciousness, there is no evidence of neck injury. The patient does report tenderness over the left shoulder and right wrist which appear atraumatic, but slightly tender to palpation no deformities. X-rays revealed no fractures  Patient is alert and well oriented at this time. His vital signs are stable. There is no indication for head CT at present. The patient is not anticoagulated.  Given the patient's mechanical fall and no significant injuries we will [facility] care and follow-up with his primary care physician.  ----------------------------------------- 6:02 AM on 09/10/2014 -----------------------------------------  Patient is resting comfortably at this time no complaints. He is alert and oriented to verbal stimuli, he was sleeping but easily aroused. Patient understands planned to go back to the [facility], and instructed him on return precautions including any headache neck pain weakness dizziness chest pain fever or other new concerns. He is agreeable.  ____________________________________________   FINAL CLINICAL IMPRESSION(S) / ED DIAGNOSES  Final diagnoses:  None   mechanical fall, initial, acute new line Contusion left shoulder, subacute Contusion right wrist acute   Sharyn CreamerMark Quale, MD 09/10/14 [phone]

## 2014-09-10 NOTE — ED Notes (Addendum)
Ice pack applied to right wrist and elevated on pillow.

## 2014-09-10 NOTE — Evaluation (Signed)
Physical Therapy Evaluation Patient Details Name: Martin [name] MRN: [id number] DOB: [birth year] Today's Date: 09/10/2014   History of Present Illness  Martin [name]  is a [age] y.o. male with a known history of coronary artery disease , CHF , hyperlipidemia, ejection fraction 25%, diabetes and lives in a facility says that for last few days he has increased frequency of the urine. He has severe hearing deficit making history difficult to obtain. Pt was previously admitted in January 2016 and is known to physical therapy. Pt reports multiple falls recently and is currently complaining of L shoulder pain which he reports started prior to his fall. Having complained of left shoulder and right wrist pain came to emergency room. In the ER x-rays are negative for any injuries but patient was noted to have UTI. BG has been uncontrolled during admission.   Clinical Impression  PT evaluation is somewhat limited due to patient being severely hard of hearing restricting his ability to follow commands. Generally his mobility appears close to baseline as pt reports that he primarily uses a power wheelchair with very limited ambulation to bathroom and back to bed. Pt is generally unsteady and is likely a continued fall risk but it does not appear acutely worsened this admission. I would [facility] after [facility] to [facility] to assess for further safety needs to reduce risk of continued falls. Otherwise pt does not appear overly interested in restarting physical therapy as he just finished therapy one month ago. While [facility] pt will benefit from continued PT services to maintain strength and improve balance.     Follow Up Recommendations Home health PT    Equipment Recommendations  None recommended by PT    Recommendations for Other Services       Precautions / Restrictions Precautions Precautions: Fall Restrictions Weight Bearing Restrictions: No      Mobility  Bed  Mobility Overal bed mobility: Independent             General bed mobility comments: Increased time to perform, increased reliance on bed rails  Transfers Overall transfer level: Modified independent Equipment used: Rolling walker (2 wheeled)             General transfer comment: Poor anterior weight shifting  Ambulation/Gait Ambulation/Gait assistance: Min guard Ambulation Distance (Feet): 7 Feet Assistive device: Rolling walker (2 wheeled) Gait Pattern/deviations: Decreased step length - right;Decreased step length - left;Decreased dorsiflexion - right;Decreased dorsiflexion - left;Shuffle   Gait velocity interpretation: <1.8 ft/sec, indicative of risk for recurrent falls    Stairs            Wheelchair Mobility    Modified Rankin (Stroke Patients Only)       Balance Overall balance assessment: Needs assistance Sitting-balance support: No upper extremity supported;Feet supported       Standing balance support: Single extremity supported     Single Leg Stance - Right Leg:  (Unable to attempt) Single Leg Stance - Left Leg:  (Unable to attempt)                         Pertinent Vitals/Pain Pain Assessment:  (Pt unable to rate. Reports L shoulder hurts "some") Pain Location: L shoulder Pain Intervention(s): Limited activity within patient's tolerance    Home Living Family/patient expects to be discharged to:: Assisted living               Home Equipment: Dan HumphreysWalker - 2 wheels Additional Comments:  Difficult to obain more information due to hearing deficits    Prior Function Level of Independence: Needs assistance   Gait / Transfers Assistance Needed: None  ADL's / Homemaking Assistance Needed: Yes        Hand Dominance        Extremity/Trunk Assessment   Upper Extremity Assessment: Overall WFL for tasks assessed (Unable to perform MMT)           Lower Extremity Assessment: Overall WFL for tasks assessed (Unable to  perform MMT)         Communication   Communication: HOH  Cognition Arousal/Alertness: Awake/alert Behavior During Therapy: WFL for tasks assessed/performed Overall Cognitive Status: Within Functional Limits for tasks assessed                      General Comments      Exercises        Assessment/Plan    PT Assessment Patient needs continued PT services  PT Diagnosis Difficulty walking;Abnormality of gait   PT Problem List Decreased strength;Decreased balance;Decreased mobility;Decreased safety awareness  PT Treatment Interventions Gait training;Functional mobility training;Therapeutic activities;Therapeutic exercise;Balance training;Neuromuscular re-education;Patient/family education   PT Goals (Current goals can be found in the Care Plan section) Acute Rehab PT Goals Patient Stated Goal: "I don't know if I want more therapy because it might make things worse" PT Goal Formulation: With patient Time For Goal Achievement: 09/24/14 Potential to Achieve Goals: Fair    Frequency Min 2X/week   Barriers to discharge        Co-evaluation               End of Session Equipment Utilized During Treatment: Gait belt Activity Tolerance: Patient tolerated treatment well Patient left: in chair;with chair alarm set      Functional Assessment Tool Used: Clinical judgement Functional Limitation: Mobility: Walking and moving around Mobility: Walking and Moving Around Current Status [phone]): At least 40 percent but less than 60 percent impaired, limited or restricted Mobility: Walking and Moving Around Goal Status [phone](G8979): At least 20 percent but less than 40 percent impaired, limited or restricted    Time: 1500-1525 PT Time Calculation (min) (ACUTE ONLY): 25 min   Charges:   PT Evaluation $Initial PT Evaluation Tier I: 1 Procedure     PT G Codes:   PT G-Codes **NOT FOR INPATIENT CLASS** Functional Assessment Tool Used: Clinical judgement Functional  Limitation: Mobility: Walking and moving around Mobility: Walking and Moving Around Current Status ([id number]): At least 40 percent but less than 60 percent impaired, limited or restricted Mobility: Walking and Moving Around Goal Status [phone]([id number]): At least 20 percent but less than 40 percent impaired, limited or restricted   Lynnea MaizesJason D Antania Hoefling, PT  Shaton Lore 09/10/2014, 3:50 PM

## 2014-09-11 DIAGNOSIS — I482 Chronic atrial fibrillation: Secondary | ICD-10-CM | POA: Diagnosis not present

## 2014-09-11 DIAGNOSIS — N390 Urinary tract infection, site not specified: Secondary | ICD-10-CM | POA: Diagnosis not present

## 2014-09-11 DIAGNOSIS — N183 Chronic kidney disease, stage 3 (moderate): Secondary | ICD-10-CM | POA: Diagnosis not present

## 2014-09-11 DIAGNOSIS — Z9181 History of falling: Secondary | ICD-10-CM | POA: Diagnosis not present

## 2014-09-11 LAB — BASIC METABOLIC PANEL
ANION GAP: 8 (ref 5–15)
BUN: 35 mg/dL — AB (ref 6–20)
CALCIUM: 8.6 mg/dL — AB (ref 8.9–10.3)
CO2: 25 mmol/L (ref 22–32)
CREATININE: 1.59 mg/dL — AB (ref 0.61–1.24)
Chloride: 104 mmol/L (ref 101–111)
GFR calc Af Amer: 42 mL/min — ABNORMAL LOW (ref >60)
GFR calc non Af Amer: 37 mL/min — ABNORMAL LOW (ref >60)
GLUCOSE: 325 mg/dL — AB (ref 65–99)
Potassium: 4 mmol/L (ref 3.5–5.1)
Sodium: 137 mmol/L (ref 135–145)

## 2014-09-11 LAB — MAGNESIUM: Magnesium: 2.2 mg/dL (ref 1.7–2.4)

## 2014-09-11 LAB — GLUCOSE, CAPILLARY
GLUCOSE-CAPILLARY: 408 mg/dL — AB (ref 70–99)
GLUCOSE-CAPILLARY: 356 mg/dL — AB (ref 70–99)
GLUCOSE-CAPILLARY: 335 mg/dL — AB (ref 70–99)
Glucose-Capillary: 206 mg/dL — ABNORMAL HIGH (ref 70–99)
Glucose-Capillary: 369 mg/dL — ABNORMAL HIGH (ref 70–99)
Glucose-Capillary: 375 mg/dL — ABNORMAL HIGH (ref 70–99)

## 2014-09-11 LAB — CBC
HEMATOCRIT: 37 % — AB (ref 40.0–52.0)
Hemoglobin: 12.5 g/dL — ABNORMAL LOW (ref 13.0–18.0)
MCH: 30.7 pg (ref 26.0–34.0)
MCHC: 33.8 g/dL (ref 32.0–36.0)
MCV: 90.9 fL (ref 80.0–100.0)
Platelets: 222 10*3/uL (ref 150–440)
RBC: 4.07 MIL/uL — ABNORMAL LOW (ref 4.40–5.90)
RDW: 13.2 % (ref 11.5–14.5)
WBC: 8.7 10*3/uL (ref 3.8–10.6)

## 2014-09-11 MED ORDER — LINAGLIPTIN 5 MG PO TABS
5.0000 mg | ORAL_TABLET | Freq: Every day | ORAL | Status: DC
  Administered 2014-09-12: 5 mg via ORAL
  Filled 2014-09-11 (×2): qty 1

## 2014-09-11 MED ORDER — INSULIN GLARGINE 100 UNIT/ML ~~LOC~~ SOLN
15.0000 [IU] | Freq: Every day | SUBCUTANEOUS | Status: DC
  Administered 2014-09-12: 15 [IU] via SUBCUTANEOUS
  Filled 2014-09-11 (×2): qty 0.15

## 2014-09-11 MED ORDER — CEFTRIAXONE SODIUM IN DEXTROSE 20 MG/ML IV SOLN
1.0000 g | INTRAVENOUS | Status: DC
  Administered 2014-09-12: 1 g via INTRAVENOUS
  Filled 2014-09-11 (×3): qty 50

## 2014-09-11 MED ORDER — CEFTRIAXONE SODIUM IN DEXTROSE 20 MG/ML IV SOLN
1.0000 g | INTRAVENOUS | Status: DC
  Filled 2014-09-11 (×2): qty 50

## 2014-09-11 MED ORDER — OXYCODONE-ACETAMINOPHEN 5-325 MG PO TABS: 0.5000 | ORAL_TABLET | Freq: Four times a day (QID) | ORAL | Status: DC | PRN

## 2014-09-11 MED ORDER — TRIAZOLAM 0.25 MG PO TABS
0.5000 mg | ORAL_TABLET | Freq: Every day | ORAL | Status: DC
  Administered 2014-09-11: 0.5 mg via ORAL
  Filled 2014-09-11: qty 2

## 2014-09-11 MED ORDER — NITROGLYCERIN 2 % TD OINT
1.0000 [in_us] | TOPICAL_OINTMENT | Freq: Four times a day (QID) | TRANSDERMAL | Status: DC
  Administered 2014-09-11 – 2014-09-12 (×3): 1 [in_us] via TOPICAL
  Filled 2014-09-11 (×4): qty 1

## 2014-09-11 NOTE — Progress Notes (Signed)
[facility]   PATIENT NAME: Martin RosenthalWalter [name]    MR#:  [id number]  DATE OF BIRTH:  [birth year]  SUBJECTIVE:  CHIEF COMPLAINT:   Chief Complaint  Patient presents with  . Fall   Pt. Here due to a fall and noted to have a UTI.  Was having some chest pain overnight but improved now.  HR noted to be intermittently in the 40's but improves on its own.  No other complaints. Daughter at bedside.   REVIEW OF SYSTEMS:    Review of Systems  Constitutional: Negative for fever and chills.  HENT: Negative for congestion, nosebleeds and tinnitus.   Eyes: Negative for blurred vision and double vision.  Respiratory: Negative for cough and shortness of breath.   Cardiovascular: Positive for chest pain. Negative for orthopnea, leg swelling and PND.  Gastrointestinal: Negative for nausea, vomiting, abdominal pain, diarrhea and constipation.  Genitourinary: Negative for dysuria and hematuria.  Neurological: Negative for tingling and focal weakness.  All other systems reviewed and are negative.   Nutrition: Heart Healthy Tolerating Diet: Yes Tolerating PT: Await Eval     DRUG ALLERGIES:  No Known Allergies  VITALS:  Blood pressure 96/54, pulse 69, temperature 98.2 F (36.8 C), temperature source Oral, resp. rate 18, height 5\' 6"  (1.676 m), weight 77.111 kg (170 lb), SpO2 97 %.  PHYSICAL EXAMINATION:   Physical Exam  GENERAL:  81 y.o.-year-old patient lying in the bed with no acute distress.  Very hard of hearing EYES: Pupils equal, round, reactive to light and accommodation. No scleral icterus. Extraocular muscles intact.  HEENT: Head atraumatic, normocephalic. Oropharynx and nasopharynx clear.  NECK:  Supple, no jugular venous distention. No thyroid enlargement, no tenderness.  LUNGS: Normal breath sounds bilaterally, no wheezing, rales,rhonchi. No use of accessory muscles of respiration.  CARDIOVASCULAR: S1, S2 normal. No murmurs, rubs, or  gallops.  ABDOMEN: Soft, nontender, nondistended. Bowel sounds present. No organomegaly or mass.  EXTREMITIES: No cyanosis, clubbing or edema b/l.    NEUROLOGIC: Cranial nerves II through XII are intact. No focal Motor or sensory deficits b/l. Globally weak.   PSYCHIATRIC: The patient is alert and oriented x 3. Good affect.  SKIN: No obvious rash, lesion, or ulcer.    LABORATORY PANEL:   CBC  Recent Labs Lab 09/11/14 0447  WBC 8.7  HGB 12.5*  HCT 37.0*  PLT 222   ------------------------------------------------------------------------------------------------------------------  Chemistries   Recent Labs Lab 09/11/14 0447  NA 137  K 4.0  CL 104  CO2 25  GLUCOSE 325*  BUN 35*  CREATININE 1.59*  CALCIUM 8.6*   ------------------------------------------------------------------------------------------------------------------  Cardiac Enzymes  Recent Labs Lab 09/10/14 2124  TROPONINI <0.03   ------------------------------------------------------------------------------------------------------------------  RADIOLOGY:  Dg Wrist Complete Right  09/10/2014   CLINICAL DATA:  Generalized LEFT shoulder and RIGHT wrist pain after fall tonight.  EXAM: RIGHT WRIST - COMPLETE 3+ VIEW  COMPARISON:  None.  FINDINGS: No acute fracture deformity or dislocation. Remote radial styloid fracture. Mild periarticular fluffy calcifications can be seen with CPPD. Moderate lateral wrist osteoarthrosis. Joint space intact without erosions. No destructive bony lesions. Soft tissue planes are not suspicious.  IMPRESSION: No acute fracture deformity or dislocation.  Remote radial styloid fracture.  Probable CPPD.   Electronically Signed   By: Awilda Metroourtnay  Bloomer   On: 09/10/2014 03:42   Dg Shoulder Left  09/10/2014   CLINICAL DATA:  Generalized shoulder and wrist pain after fall tonight.  EXAM: LEFT SHOULDER - 2+ VIEW  COMPARISON:  None.  FINDINGS: The humeral head is well-formed and located. The  subacromial, glenohumeral and acromioclavicular joint spaces are intact. No destructive bony lesions. Soft tissue planes are non-suspicious. Mild vascular calcifications.  IMPRESSION: Negative.   Electronically Signed   By: Awilda Metroourtnay  Bloomer   On: 09/10/2014 03:40     ASSESSMENT AND PLAN:   81 yo male w/ hx of Cardiomyopathy w/ EF of 25%, CKD Stage III, DM, HTN, DM neuropathy, chronic a. Fib, hx of PVD, hx of OA, BPH, came into hospital due to a fall and noted to have UTI.   * S/p Fall - recurrent as pt. Had 3 falls in 2 weeks.  - ?? Related to UTI (vs) deconditioning.  - await PT eval.  Cont. IV abx for UTI.   * UTI - cont. IV Ceftriaxone and follow urine cultures.   * Chronic a. Fib - pt. Had some bradycardic episodes this a.m.  - cont. Tele.  Await Cards eval.  - d/c Metoprolol for now.  - not on long term anticoagulation due to high fall risk.   * CKD Stage III - Cr. Close to baseline and will monitor.   * DM - BS running a bit high.  - will increase lantus, cont. SSI, add Tradjenta - cont. Glimeperide.   * Chest Pain - ?? Angina (vs) musculoskeletal in nature.  - does have risk factors given hx of CM w/ EF of 25%.  - cont. ASA, Statin. PRN nitro. Await Cards input.  - would start Imdur but BP on low side due to CM.   * HYperlipidemia - cont. Pravachol.   * Chronic pain - cont. Oxycodone.   All the records are reviewed and case discussed with Care Management/Social Workerr. Management plans discussed with the patient, family and they are in agreement.  CODE STATUS: Full  DVT Prophylaxis: Heparin SQ  TOTAL TIME TAKING CARE OF THIS PATIENT: 30 minutes.   POSSIBLE D/C IN 1-2 DAYS, DEPENDING ON CLINICAL CONDITION.   Houston SirenSAINANI,VIVEK J M.D on 09/11/2014 at 1:13 PM  Between 7am to 6pm - Pager - [phone]  After 6pm go to [email or web address] - password [email or web address] [facility]  [facility]  Office  [phone]  CC: Primary care physician; Mila MerryFISHER, DONALD, MD

## 2014-09-11 NOTE — Progress Notes (Signed)
Patient complaining of chest pain last night. MD notified. Orders received to give nitro. Pain subsided after the second dose of nitro. Patient rested quietly for the rest of the night.

## 2014-09-11 NOTE — Progress Notes (Signed)
During morning medication administration, checked vital signs and blood pressure low, held metoprolol, notified MD.  Heart rate averaging mid 60s and then would drop to 34, placed on Teley and Magnesium checked.  Potassium normal.

## 2014-09-11 NOTE — Progress Notes (Signed)
Patient extremely hard of hearing, hearing aids in place.  Daughter Archie Pattenonya visited and was present at bedside when MD rounded.  Cardiology consulted as patient was having low blood pressure, irregular heart rate dropping into mid 30s and returning to mid 60s, on teley and showing afib, patient complained of chest pain that was relieved by sublingual nitro.  Cardiology consulted and ordered Nitro paste Q6 hrs.

## 2014-09-12 DIAGNOSIS — I10 Essential (primary) hypertension: Secondary | ICD-10-CM | POA: Diagnosis not present

## 2014-09-12 DIAGNOSIS — G8929 Other chronic pain: Secondary | ICD-10-CM | POA: Diagnosis not present

## 2014-09-12 DIAGNOSIS — N390 Urinary tract infection, site not specified: Secondary | ICD-10-CM | POA: Diagnosis not present

## 2014-09-12 DIAGNOSIS — G629 Polyneuropathy, unspecified: Secondary | ICD-10-CM | POA: Diagnosis not present

## 2014-09-12 DIAGNOSIS — Z9181 History of falling: Secondary | ICD-10-CM | POA: Diagnosis not present

## 2014-09-12 DIAGNOSIS — E119 Type 2 diabetes mellitus without complications: Secondary | ICD-10-CM | POA: Diagnosis not present

## 2014-09-12 LAB — GLUCOSE, CAPILLARY
GLUCOSE-CAPILLARY: 453 mg/dL — AB (ref 70–99)
Glucose-Capillary: 551 mg/dL (ref 70–99)
Glucose-Capillary: 305 mg/dL — ABNORMAL HIGH (ref 70–99)
Glucose-Capillary: 465 mg/dL — ABNORMAL HIGH (ref 70–99)
Glucose-Capillary: 443 mg/dL — ABNORMAL HIGH (ref 70–99)
Glucose-Capillary: 246 mg/dL — ABNORMAL HIGH (ref 70–99)

## 2014-09-12 MED ORDER — OXYCODONE-ACETAMINOPHEN 5-325 MG PO TABS: 0.5000 | ORAL_TABLET | Freq: Four times a day (QID) | ORAL | Status: DC | PRN

## 2014-09-12 MED ORDER — INSULIN ASPART 100 UNIT/ML ~~LOC~~ SOLN
14.0000 [IU] | Freq: Once | SUBCUTANEOUS | Status: AC
  Administered 2014-09-12: 14 [IU] via SUBCUTANEOUS
  Filled 2014-09-12: qty 14

## 2014-09-12 MED ORDER — INSULIN ASPART 100 UNIT/ML ~~LOC~~ SOLN
15.0000 [IU] | Freq: Once | SUBCUTANEOUS | Status: AC
  Administered 2014-09-12: 15 [IU] via SUBCUTANEOUS
  Filled 2014-09-12: qty 15

## 2014-09-12 MED ORDER — NITROGLYCERIN 0.4 MG SL SUBL: 0.4000 mg | SUBLINGUAL_TABLET | SUBLINGUAL | Status: AC | PRN

## 2014-09-12 MED ORDER — INSULIN ASPART 100 UNIT/ML FLEXPEN: 8.0000 [IU] | PEN_INJECTOR | Freq: Three times a day (TID) | SUBCUTANEOUS | Status: DC

## 2014-09-12 MED ORDER — CEFUROXIME AXETIL 250 MG PO TABS: 250.0000 mg | ORAL_TABLET | Freq: Two times a day (BID) | ORAL | Status: AC

## 2014-09-12 MED ORDER — INSULIN GLARGINE 100 UNIT/ML ~~LOC~~ SOLN: 15.0000 [IU] | Freq: Every day | SUBCUTANEOUS | Status: DC

## 2014-09-12 MED ORDER — INSULIN GLARGINE 100 UNIT/ML ~~LOC~~ SOLN: 20.0000 [IU] | Freq: Every day | SUBCUTANEOUS | Status: DC

## 2014-09-12 MED ORDER — CARVEDILOL 3.125 MG PO TABS: 3.1250 mg | ORAL_TABLET | Freq: Two times a day (BID) | ORAL | Status: DC

## 2014-09-12 NOTE — Progress Notes (Signed)
Pts glucose 453. Dr. Cherlynn KaiserSainani notified and ordered one time 15 units Novalog. (15 units Novalog given).

## 2014-09-12 NOTE — Progress Notes (Signed)
After 14 units Novalog given, Pts recheck BG 246. Will continue to monitor.

## 2014-09-12 NOTE — Care Management Note (Addendum)
Case Management Note  Patient Details  Name: [name] A Juniel MRN: [id number] Date of Birth: [birth year]  Subjective/Objective:                  [phone] needing home health PT and RN. [facility] past but [facility] because they are in-house for physical therapy.   Action/Plan: Referral faxed to Sherrilyn RistKari with [facility] to [facility][phone].  Expected Discharge Date:                  Expected Discharge Plan:  Assisted Living / Rest Home  In-House Referral:  Clinical Social Work  Discharge planning Services  CM Consult  Post Acute Care Choice:  Home Health Choice offered to:  [facility] / [facility]  DME Arranged:    DME Agency:     HH Arranged:  RN, PT HH Agency:  [facility]  Status of Service:  Completed, signed off  Medicare Important Message Given:  N/A - LOS <3 / Initial given by admissions Date Medicare IM Given:    Medicare IM give by:    Date Additional Medicare IM Given:    Additional Medicare Important Message give by:     If discussed at Long Length of Stay Meetings, dates discussed:    Additional Comments:  RNCM referral received for assistance with placement due to family not being happy with the [facility]. CSW referral in place.   Collie SiadAngela Jackie Russman, RN 09/12/2014, 10:02 AM

## 2014-09-12 NOTE — Discharge Instructions (Addendum)
°  DIET:  Cardiac diet  DISCHARGE CONDITION:  Good  ACTIVITY:  Activity as tolerated  OXYGEN:  Home Oxygen: No.   Oxygen Delivery: room air  DISCHARGE LOCATION:  Assisted Living.     If you experience worsening of your admission symptoms, develop shortness of breath, life threatening emergency, suicidal or homicidal thoughts you must seek medical attention immediately by calling 911 or calling your MD immediately  if symptoms less severe.  You Must read complete instructions/literature along with all the possible adverse reactions/side effects for all the Medicines you take and that have been prescribed to you. Take any new Medicines after you have completely understood and accpet all the possible adverse reactions/side effects.   Please note  You were cared for by a hospitalist during your hospital stay. If you have any questions about your discharge medications or the care you received while you were in the hospital after you are discharged, you can call the unit and asked to speak with the hospitalist on call if the hospitalist that took care of you is not available. Once you are discharged, your primary care physician will handle any further medical issues. Please note that NO REFILLS for any discharge medications will be authorized once you are discharged, as it is imperative that you return to your primary care physician (or establish a relationship with a primary care physician if you do not have one) for your aftercare needs so that they can reassess your need for medications and monitor your lab values.  DR Theodis AguasFISHER'S OFFICE WILL CALL YOU TO MAKE AN APPOINTMENT

## 2014-09-12 NOTE — Clinical Social Work Note (Signed)
Clinical Social Work Assessment  Patient Details  Name: Martin [name] MRN: [id number] Date of Birth: 30-Apr-1922  Date of referral:  09/12/14               Reason for consult:  Facility Placement                Permission sought to share information with:  Oceanographeracility Contact Representative Permission granted to share information::  Yes, Verbal Permission Granted  Name::      [name] [employer] features editorTonya  Agency::   Assisted Living Facility  Relationship::   Paediatric nurseTonya  Contact Information:     Housing/Transportation Living arrangements for the past 2 months:  Assisted Living Facility Source of Information:  Patient, Adult Children Patient Interpreter Needed:  None Criminal Activity/Legal Involvement Pertinent to Current Situation/Hospitalization:  No - Comment as needed Significant Relationships:  Adult Children Lives with:  Facility Resident Do you feel safe going back to the place where you live?  Yes Need for family participation in patient care:  Yes (Comment)    Social Worker assessment / plan:  Visual merchandiserClinical Social Worker (CSW) received consult that patient is from [name]. PT is recommending home heath. RN Case Manager [facility]. CSW contacted Event organiserBell administrator at [name]. Per Bell patient can return with home heath. CSW contacted patient's daughter Martin [name]. Daughter reported that patient as been a resident at [name] since November 2015. Daughter is agreeable to patient returning to [name] with [facility].   Employment status:  Retired Health and safety inspectornsurance information:  [place] [employer] PT Recommendations:  Home with Home Health Information / Referral to community resources:  Other (Comment Required) (Assisted Living )  Patient/Family's Response to care:  Patient and family are agreeable to returning to [name].     Emotional Assessment Appearance:  Appears stated age Attitude/Demeanor/Rapport:    Affect (typically observed):   Accepting Orientation:    Alcohol / Substance use:  Not Applicable Psych involvement (Current and /or in the community):  No (Comment)  Discharge Needs  Concerns to be addressed:  No discharge needs identified Readmission within the last 30 days:  No Current discharge risk:  None Barriers to Discharge:  No Barriers Identified   Martin [name], Martin Wilmore G, LCSW 09/12/2014, 2:01 PM

## 2014-09-12 NOTE — Discharge Summary (Addendum)
[facility] [place] at [facility]   PATIENT NAME: Martin [name]    MR#:  [id number]  DATE OF BIRTH:  1921/06/23  DATE OF ADMISSION:  09/10/2014 ADMITTING PHYSICIAN: Altamese Dilling, MD  DATE OF DISCHARGE: 09/12/2014  PRIMARY CARE PHYSICIAN: Mila Merry, MD    ADMISSION DIAGNOSIS:  Dehydration [E86.0] Acute urinary tract infection [N39.0] Contusion of left shoulder, initial encounter [S40.012A] Wrist contusion, right, initial encounter [S60.211A] Accident due to mechanical fall without injury [W19.XXXA]  DISCHARGE DIAGNOSIS:  Principal Problem:   UTI (lower urinary tract infection) Active Problems:   Fall   SECONDARY DIAGNOSIS:   Past Medical History  Diagnosis Date  . Coronary artery disease   . CHF (congestive heart failure)     ischemic cardiomyopathy- EF 25%  . Hyperlipidemia   . Diabetes mellitus without complication   . Atrial fibrillation   . BPH (benign prostatic hyperplasia)     HOSPITAL COURSE:   81 year old male with past medical history of ischemic cardiomyopathy diabetes hypertension BPH chronic H fibrillation who presented to the hospital with a fall and noted to have a urinary tract infection.  #1 urinary tract infection - this was secondary to Proteus. Patient's urine cultures grew out Proteus mirabilis.  Patient was given IV ceftriaxone in the hospital is currently being discharged on oral Ceftin.  #2 diabetes type II without complication - patient was only on Glimeperide prior to coming in but his blood sugars remained fairly uncontrolled while in the hospital. During last hospitalization patient was taken off his insulin therefore right now he is being discharged on some low-dose Lantus along with NovoLog with meals.   - cont. To follow BS with meals and at bedtime.    #3 congestive heart failure - this is chronic systolic CHF. Patient clinically was not in congestive heart failure while in the hospital he will  continue his low-dose Lasix along with beta blocker for now.  #4 chest pain - patient had some nonspecific chest pain while in the hospital he was observed on telemetry had cardiac markers checked which were negative. Patient will continue some as needed nitroglycerin and his beta blocker and statin and will have close follow-up with his cardiologist Dr. Gwen Pounds as an outpatient. His blood pressure was in the low side therefore we did not initiate long-acting nitrates like imdur.    #5 BPH - patient will continue his terazosin.  #6 status post fall - this was a mechanical fall at the assisted living. Patient was evaluated by physical therapy and did not think that the patient needed a higher level of care therefore we will continue his home health services.   #7 bradycardia - patient on telemetry had episodic bradycardia with heart rates going down to as low in the 30s to 40s. Patient was taken off his metoprolol and his heart rates have improved. At present patient will be discharged on some low-dose Coreg given his cardiomyopathy and follow-up with his cardiologist as an outpatient.    #8 chronic pain - patient will continue these as needed oxycodone.    DISCHARGE CONDITIONS:   Stable  CONSULTS OBTAINED:  Treatment Team:  Lamar Blinks, MD  DRUG ALLERGIES:  No Known Allergies  DISCHARGE MEDICATIONS:   Current Discharge Medication List    START taking these medications   Details  carvedilol (COREG) 3.125 MG tablet Take 1 tablet (3.125 mg total) by mouth 2 (two) times daily with a meal.    cefUROXime (CEFTIN) 250 MG tablet  Take 1 tablet (250 mg total) by mouth 2 (two) times daily with a meal. Qty: 10 tablet    insulin aspart (NOVOLOG) 100 UNIT/ML FlexPen Inject 8 Units into the skin 3 (three) times daily with meals. Qty: 15 mL, Refills: 11    insulin glargine (LANTUS) 100 UNIT/ML injection Inject 0.2 mLs (20 Units total) into the skin daily. Qty: 10 mL, Refills: 11     nitroGLYCERIN (NITROSTAT) 0.4 MG SL tablet Place 1 tablet (0.4 mg total) under the tongue every 5 (five) minutes as needed for chest pain. Qty: 30 tablet, Refills: 1      CONTINUE these medications which have CHANGED   Details  !! oxyCODONE-acetaminophen (PERCOCET/ROXICET) 5-325 MG per tablet Take 0.5 tablets by mouth every 6 (six) hours as needed for moderate pain or severe pain. Qty: 30 tablet, Refills: 0     !! - Potential duplicate medications found. Please discuss with provider.    CONTINUE these medications which have NOT CHANGED   Details  aspirin EC 325 MG tablet Take 325 mg by mouth daily.    Docusate Sodium 100 MG capsule Take 100 mg by mouth 2 (two) times daily.    !! furosemide (LASIX) 40 MG tablet Take 40 mg by mouth every morning.     !! furosemide (LASIX) 40 MG tablet Take 40 mg by mouth daily as needed (in the afternoon if shortness of breath or weight gain of 2 pounds occur).    gabapentin (NEURONTIN) 300 MG capsule Take 300 mg by mouth 3 (three) times daily.    !! oxyCODONE-acetaminophen (PERCOCET/ROXICET) 5-325 MG per tablet Take 0.5 tablets by mouth at bedtime.    !! oxyCODONE-acetaminophen (PERCOCET/ROXICET) 5-325 MG per tablet Take 0.5 tablets by mouth every 6 (six) hours as needed for moderate pain or severe pain.    sitaGLIPtin (JANUVIA) 50 MG tablet Take 50 mg by mouth daily.    terazosin (HYTRIN) 2 MG capsule Take 2 mg by mouth daily.     glimepiride (AMARYL) 2 MG tablet Take 2 mg by mouth daily with breakfast.    pravastatin (PRAVACHOL) 20 MG tablet Take 20 mg by mouth at bedtime.     triazolam (HALCION) 0.25 MG tablet Take 0.5 mg by mouth at bedtime.      !! - Potential duplicate medications found. Please discuss with provider.    STOP taking these medications     metoprolol succinate (TOPROL-XL) 25 MG 24 hr tablet          DISCHARGE INSTRUCTIONS:   DIET:  Cardiac diet and Diabetic diet  DISCHARGE CONDITION:  Stable  ACTIVITY:   Activity as tolerated  OXYGEN:  Home Oxygen: No.   Oxygen Delivery: room air  DISCHARGE LOCATION:  Assisted Living.    If you experience worsening of your admission symptoms, develop shortness of breath, life threatening emergency, suicidal or homicidal thoughts you must seek medical attention immediately by calling 911 or calling your MD immediately  if symptoms less severe.  You Must read complete instructions/literature along with all the possible adverse reactions/side effects for all the Medicines you take and that have been prescribed to you. Take any new Medicines after you have completely understood and accpet all the possible adverse reactions/side effects.   Please note  You were cared for by a hospitalist during your hospital stay. If you have any questions about your discharge medications or the care you received while you were in the hospital after you are discharged, you can call the unit  and asked to speak with the hospitalist on call if the hospitalist that took care of you is not available. Once you are discharged, your primary care physician will handle any further medical issues. Please note that NO REFILLS for any discharge medications will be authorized once you are discharged, as it is imperative that you return to your primary care physician (or establish a relationship with a primary care physician if you do not have one) for your aftercare needs so that they can reassess your need for medications and monitor your lab values.     Today   CHIEF COMPLAINT:   Chief Complaint  Patient presents with  . Fall    HISTORY OF PRESENT ILLNESS:  Martin [name]  is a 81 y.o. male with a known history of ishcemic CM w/ EF of 25%, DM, HTN, Hyperlipidemia, hx of CHF, BPH came into hospital due to a fall noted to have UTI.  Also noted to have uncontrolled DM.    VITAL SIGNS:  Blood pressure 133/106, pulse 57, temperature 97.6 F (36.4 C), temperature source Oral, resp. rate  18, height  (1.676 m), weight 77.111 kg (170 lb), SpO2 96 %.  I/O:   Intake/Output Summary (Last 24 hours) at 09/12/14 1204 Last data filed at 09/12/14 1116  Gross per 24 hour  Intake    530 ml  Output   1150 ml  Net   -620 ml    PHYSICAL EXAMINATION:  GENERAL:  81 y.o.-year-old patient lying in the bed with no acute distress. Very hard of hearing.  EYES: Pupils equal, round, reactive to light and accommodation. No scleral icterus. Extraocular muscles intact.  HEENT: Head atraumatic, normocephalic. Oropharynx and nasopharynx clear.  NECK:  Supple, no jugular venous distention. No thyroid enlargement, no tenderness.  LUNGS: Normal breath sounds bilaterally, no wheezing, rales,rhonchi or crepitation. No use of accessory muscles of respiration.  CARDIOVASCULAR: S1, S2 normal. No murmurs, rubs, or gallops.  ABDOMEN: Soft, non-tender, non-distended. Bowel sounds present. No organomegaly or mass.  EXTREMITIES: No pedal edema, cyanosis, or clubbing.  NEUROLOGIC: Cranial nerves II through XII are intact. No focal motor or sensory defecits b/l.  PSYCHIATRIC: The patient is alert and oriented x 3.  SKIN: No obvious rash, lesion, or ulcer.   DATA REVIEW:   CBC  Recent Labs Lab 09/11/14 0447  WBC 8.7  HGB 12.5*  HCT 37.0*  PLT 222    Chemistries   Recent Labs Lab 09/11/14 0447 09/11/14 0459  NA 137  --   K 4.0  --   CL 104  --   CO2 25  --   GLUCOSE 325*  --   BUN 35*  --   CREATININE 1.59*  --   CALCIUM 8.6*  --   MG  --  2.2    Cardiac Enzymes  Recent Labs Lab 09/10/14 2124  TROPONINI <0.03    Microbiology Results  Results for orders placed or performed in visit on 05/17/14  Urine culture     Status: None   Collection Time: 05/17/14  8:37 PM  Result Value Ref Range Status   Micro Text Report   Final       SOURCE: CLEAN CATCH    ORGANISM 1                >100,000 CFU/ML Proteus mirabilis   COMMENT                   -   ANTIBIOTIC  ORG#1     AMPICILLIN                    S         CEFAZOLIN                     S         CEFOXITIN                     S         CEFTRIAXONE                   S         CIPROFLOXACIN                 R         ERTAPENEM                     S         GENTAMICIN                    S         IMIPENEM                      S         LEVOFLOXACIN                  R         NITROFURANTOIN                R         Trimethoprim/Sulfamethoxazole S           Culture, blood (single)     Status: None   Collection Time: 05/17/14  9:43 PM  Result Value Ref Range Status   Micro Text Report   Final       COMMENT                   NO GROWTH AEROBICALLY/ANAEROBICALLY IN 5 DAYS   ANTIBIOTIC                                                      Culture, blood (single)     Status: None   Collection Time: 05/17/14  9:44 PM  Result Value Ref Range Status   Micro Text Report   Final       COMMENT                   NO GROWTH AEROBICALLY/ANAEROBICALLY IN 5 DAYS   ANTIBIOTIC                                                        RADIOLOGY:  No results found.    Management plans discussed with the patient, family and they are in agreement.  CODE STATUS:     Code Status Orders        Start     Ordered   09/10/14 1206  Full code   Continuous     09/10/14 1205  TOTAL TIME TAKING CARE OF THIS PATIENT: 35 minutes.    Houston Siren M.D on 09/12/2014 at 12:04 PM  Between 7am to 6pm - Pager - [phone]  After 6pm go to [email or web address] - password [email or web address] [facility]  [facility]  Office  [phone]  CC: Primary care physician; Mila Merry, MD

## 2014-09-12 NOTE — Progress Notes (Signed)
Physical Therapy Treatment Patient Details Name: Martin [name] MRN: [id number] DOB: 1921-09-19 Today's Date: 09/12/2014    History of Present Illness Martin [name]  is a 81 y.o. male with a known history of coronary artery disease , CHF , hyperlipidemia, ejection fraction 25%, diabetes and lives in a facility says that for last few days he has increased frequency of the urine. He has severe hearing deficit making history difficult to obtain. Pt was previously admitted in January 2016 and is known to physical therapy. Pt reports multiple falls recently and is currently complaining of L shoulder pain which he reports started prior to his fall. Having complained of left shoulder and right wrist pain came to emergency room. In the ER x-rays are negative for any injuries but patient was noted to have UTI. BG has been uncontrolled during admission.     PT Comments    Pt being discharged later today to return to assisted living per case manager.  Pt modified independent with bed mobility and has limited gait distance with RW due to LE weakness (pt uses electric scooter at baseline).  LE strengthening performed seated in recliner with no adverse effects.  Pt tolerated treatemnt well and is progressing well towards goals.  Follow Up Recommendations  Home health PT     Equipment Recommendations  None recommended by PT    Recommendations for Other Services       Precautions / Restrictions Precautions Precautions: Fall    Mobility  Bed Mobility Overal bed mobility: Independent                Transfers Overall transfer level: Modified independent Equipment used: Rolling walker (2 wheeled)             General transfer comment: Trying to pull on RW for anterior weight shift, redirected to push against bed  Ambulation/Gait Ambulation/Gait assistance: Min guard Ambulation Distance (Feet): 10 Feet Assistive device: Rolling walker (2 wheeled) Gait Pattern/deviations: Decreased step  length - right;Decreased step length - left;Step-through pattern         Stairs            Wheelchair Mobility    Modified Rankin (Stroke Patients Only)       Balance                                    Cognition Arousal/Alertness: Awake/alert Behavior During Therapy: WFL for tasks assessed/performed Overall Cognitive Status: Within Functional Limits for tasks assessed                      Exercises Other Exercises Other Exercises: Seated, 10-15 reps bilateral: AP, LAQ, hip flexion, resisted PF/DF, heel slides, SLR    General Comments        Pertinent Vitals/Pain      Home Living                      Prior Function            PT Goals (current goals can now be found in the care plan section) Acute Rehab PT Goals Patient Stated Goal: My hearing aide battery is dead.    Frequency  Min 2X/week    PT Plan      Co-evaluation             End of Session Equipment Utilized During Treatment: Gait belt Activity Tolerance: Patient  tolerated treatment well Patient left: in chair;with chair alarm set     Time: 1307-1330 PT Time Calculation (min) (ACUTE ONLY): 23 min  Charges:                       G Codes:      Iviana Blasingame A Rachell Druckenmiller 09/12/2014, 1:46 PM

## 2014-09-12 NOTE — Progress Notes (Signed)
Patient is medically stable for D/C back to [facility]. Per Event organiserBell administrator at [name] patient can return. Bell reported that RN does not have to call report. Clinical Child psychotherapistocial Worker (CSW) prepared D/C packet and faxed D/C Summary and FL2 to [name]. CSW contacted patient's daughter Archie Pattenonya. Archie Pattenonya reported that she would provide transportation for patient after she gets off work at 5 pm. RN aware of above. Patient aware of above. Please reconsult if future social work needs arise. CSW signing off.   Jetta LoutBailey Morgan, LCSWA [phone]

## 2014-09-12 NOTE — Progress Notes (Addendum)
Discharge Note:  Received discharge orders, Pts VSS, IV removed catheter intact. Discharge instructions given to pts son-in-law, verbalized understanding. Pt wheeled to lobby with son in law at side.

## 2014-09-12 NOTE — Progress Notes (Signed)
Inpatient Diabetes Program Recommendations  AACE/ADA: New Consensus Statement on Inpatient Glycemic Control (2013)  Target Ranges:  Prepandial:   less than 140 mg/dL      Peak postprandial:   less than 180 mg/dL (1-2 hours)      Critically ill patients:  140 - 180 mg/dL   Results for Martin [name], Martin [name] (MRN [id number]) as of 09/12/2014 11:57  Ref. Range 09/11/2014 08:36 09/11/2014 11:37 09/11/2014 15:54 09/11/2014 19:23 09/11/2014 21:40 09/12/2014 08:20 09/12/2014 11:43 09/12/2014 11:50  Glucose-Capillary Latest Ref Range: 70-99 mg/dL 914356 (H) 782335 (H) 956206 (H) 369 (H) 375 (H) 305 (H) 465 (H) 453 (H)   Reason for Visit: Hyperglycemia/UTI  Diabetes history: DM 2 Outpatient Diabetes medications: Januvia 50 mg Daily, Amaryl 2 mg Daily Current orders for Inpatient glycemic control: Lantus 15 units Daily, Tradjenta 5 mg Daily, Amaryl 2 mg Daily, Novolog 0-9 units TID  Inpatient Diabetes Program Recommendations HgbA1C: With patient being from [name] facility and coming in with 500's glucose, may want to consider ordering an A1c level to determine if patient needs further medication for DM on discharge. Insulin - Meal Coverage: Glucose is increasing at meal times. Please consider Novolog 6 units TID for meal coverage in addition to correction scale if patient is consuming at least 50 % of meals.  Note: Patient came in with hyperglycemia >500 glucose. Patient may have achieved lower glucose levels if initially placed on IV insulin/glucostabilizer. The rates on the IV insulin Glucostabilizer may have assisted us on determining basal dose for patient when transitioned off IV insulin. Noted patient's Lantus dose increased from 5 units to 15 units and was received this am in the later morning. Watch trends on increased dose, which is 0.2 units/kg.  Thanks,  Christena DeemShannon Naelle Diegel RN, MSN, [facility] Inpatient Diabetes Coordinator Team Pager [phone]

## 2014-09-12 NOTE — Progress Notes (Signed)
Spoke with Dr. Cherlynn KaiserSainani regarding patient continued elevated glucose.  MD stated that additional units were given and patient should be rechecked following second administration.  Dietary review of meal options reviewed and excluded some items that were contributing to elevated sugars.

## 2014-09-13 LAB — URINE CULTURE: Culture: 1000

## 2014-09-23 DIAGNOSIS — F419 Anxiety disorder, unspecified: Secondary | ICD-10-CM | POA: Diagnosis not present

## 2014-09-23 DIAGNOSIS — R001 Bradycardia, unspecified: Secondary | ICD-10-CM | POA: Diagnosis not present

## 2014-09-23 DIAGNOSIS — E785 Hyperlipidemia, unspecified: Secondary | ICD-10-CM | POA: Diagnosis not present

## 2014-09-23 DIAGNOSIS — N390 Urinary tract infection, site not specified: Secondary | ICD-10-CM | POA: Diagnosis not present

## 2014-09-23 DIAGNOSIS — K5901 Slow transit constipation: Secondary | ICD-10-CM | POA: Diagnosis not present

## 2014-09-23 DIAGNOSIS — E1165 Type 2 diabetes mellitus with hyperglycemia: Secondary | ICD-10-CM | POA: Diagnosis not present

## 2014-09-23 DIAGNOSIS — I10 Essential (primary) hypertension: Secondary | ICD-10-CM | POA: Diagnosis not present

## 2014-09-23 DIAGNOSIS — G8929 Other chronic pain: Secondary | ICD-10-CM | POA: Diagnosis not present

## 2014-10-01 ENCOUNTER — Encounter: Payer: Self-pay | Admitting: Emergency Medicine

## 2014-10-01 ENCOUNTER — Other Ambulatory Visit: Payer: Self-pay

## 2014-10-01 ENCOUNTER — Observation Stay
Admission: EM | Admit: 2014-10-01 | Discharge: 2014-10-02 | Payer: Medicare Other | Attending: Internal Medicine | Admitting: Internal Medicine

## 2014-10-01 ENCOUNTER — Emergency Department: Payer: Medicare Other

## 2014-10-01 DIAGNOSIS — M25512 Pain in left shoulder: Secondary | ICD-10-CM | POA: Diagnosis not present

## 2014-10-01 DIAGNOSIS — E785 Hyperlipidemia, unspecified: Secondary | ICD-10-CM | POA: Diagnosis not present

## 2014-10-01 DIAGNOSIS — I739 Peripheral vascular disease, unspecified: Secondary | ICD-10-CM | POA: Diagnosis not present

## 2014-10-01 DIAGNOSIS — R001 Bradycardia, unspecified: Secondary | ICD-10-CM | POA: Insufficient documentation

## 2014-10-01 DIAGNOSIS — E669 Obesity, unspecified: Secondary | ICD-10-CM | POA: Diagnosis not present

## 2014-10-01 DIAGNOSIS — N183 Chronic kidney disease, stage 3 unspecified: Secondary | ICD-10-CM | POA: Diagnosis present

## 2014-10-01 DIAGNOSIS — I482 Chronic atrial fibrillation, unspecified: Secondary | ICD-10-CM | POA: Diagnosis present

## 2014-10-01 DIAGNOSIS — M25531 Pain in right wrist: Secondary | ICD-10-CM | POA: Diagnosis not present

## 2014-10-01 DIAGNOSIS — I5020 Unspecified systolic (congestive) heart failure: Secondary | ICD-10-CM | POA: Diagnosis not present

## 2014-10-01 DIAGNOSIS — N400 Enlarged prostate without lower urinary tract symptoms: Secondary | ICD-10-CM | POA: Diagnosis not present

## 2014-10-01 DIAGNOSIS — R072 Precordial pain: Principal | ICD-10-CM | POA: Insufficient documentation

## 2014-10-01 DIAGNOSIS — I5022 Chronic systolic (congestive) heart failure: Secondary | ICD-10-CM | POA: Diagnosis present

## 2014-10-01 DIAGNOSIS — I2510 Atherosclerotic heart disease of native coronary artery without angina pectoris: Secondary | ICD-10-CM | POA: Diagnosis not present

## 2014-10-01 DIAGNOSIS — Z7982 Long term (current) use of aspirin: Secondary | ICD-10-CM | POA: Insufficient documentation

## 2014-10-01 DIAGNOSIS — W19XXXA Unspecified fall, initial encounter: Secondary | ICD-10-CM | POA: Diagnosis not present

## 2014-10-01 DIAGNOSIS — S52513A Displaced fracture of unspecified radial styloid process, initial encounter for closed fracture: Secondary | ICD-10-CM | POA: Insufficient documentation

## 2014-10-01 DIAGNOSIS — R4182 Altered mental status, unspecified: Secondary | ICD-10-CM | POA: Insufficient documentation

## 2014-10-01 DIAGNOSIS — E119 Type 2 diabetes mellitus without complications: Secondary | ICD-10-CM

## 2014-10-01 DIAGNOSIS — Z951 Presence of aortocoronary bypass graft: Secondary | ICD-10-CM | POA: Insufficient documentation

## 2014-10-01 DIAGNOSIS — Z993 Dependence on wheelchair: Secondary | ICD-10-CM | POA: Diagnosis not present

## 2014-10-01 DIAGNOSIS — N390 Urinary tract infection, site not specified: Secondary | ICD-10-CM | POA: Insufficient documentation

## 2014-10-01 DIAGNOSIS — M1990 Unspecified osteoarthritis, unspecified site: Secondary | ICD-10-CM | POA: Diagnosis not present

## 2014-10-01 DIAGNOSIS — R079 Chest pain, unspecified: Secondary | ICD-10-CM | POA: Diagnosis present

## 2014-10-01 DIAGNOSIS — E1122 Type 2 diabetes mellitus with diabetic chronic kidney disease: Secondary | ICD-10-CM | POA: Diagnosis not present

## 2014-10-01 DIAGNOSIS — R0602 Shortness of breath: Secondary | ICD-10-CM | POA: Insufficient documentation

## 2014-10-01 DIAGNOSIS — Z794 Long term (current) use of insulin: Secondary | ICD-10-CM | POA: Insufficient documentation

## 2014-10-01 DIAGNOSIS — Z79899 Other long term (current) drug therapy: Secondary | ICD-10-CM | POA: Diagnosis not present

## 2014-10-01 DIAGNOSIS — I255 Ischemic cardiomyopathy: Secondary | ICD-10-CM | POA: Insufficient documentation

## 2014-10-01 DIAGNOSIS — I25118 Atherosclerotic heart disease of native coronary artery with other forms of angina pectoris: Secondary | ICD-10-CM | POA: Diagnosis not present

## 2014-10-01 DIAGNOSIS — E1169 Type 2 diabetes mellitus with other specified complication: Secondary | ICD-10-CM

## 2014-10-01 HISTORY — DX: Chronic kidney disease, unspecified: N18.9

## 2014-10-01 HISTORY — DX: Peripheral vascular disease, unspecified: I73.9

## 2014-10-01 LAB — BASIC METABOLIC PANEL
Anion gap: 8 (ref 5–15)
BUN: 31 mg/dL — ABNORMAL HIGH (ref 6–20)
CHLORIDE: 104 mmol/L (ref 101–111)
CO2: 28 mmol/L (ref 22–32)
Calcium: 8.8 mg/dL — ABNORMAL LOW (ref 8.9–10.3)
Creatinine, Ser: 1.5 mg/dL — ABNORMAL HIGH (ref 0.61–1.24)
GFR, EST AFRICAN AMERICAN: 45 mL/min — AB (ref >60)
GFR, EST NON AFRICAN AMERICAN: 39 mL/min — AB (ref >60)
Glucose, Bld: 82 mg/dL (ref 65–99)
Potassium: 3.7 mmol/L (ref 3.5–5.1)
Sodium: 140 mmol/L (ref 135–145)

## 2014-10-01 LAB — CBC WITH DIFFERENTIAL/PLATELET
Basophils Absolute: 0.1 10*3/uL (ref 0–0.1)
Basophils Relative: 1 %
Eosinophils Absolute: 0.4 10*3/uL (ref 0–0.7)
Eosinophils Relative: 3 %
HEMATOCRIT: 37.4 % — AB (ref 40.0–52.0)
Hemoglobin: 12.5 g/dL — ABNORMAL LOW (ref 13.0–18.0)
LYMPHS ABS: 2.6 10*3/uL (ref 1.0–3.6)
Lymphocytes Relative: 22 %
MCH: 31.1 pg (ref 26.0–34.0)
MCHC: 33.5 g/dL (ref 32.0–36.0)
MCV: 93 fL (ref 80.0–100.0)
Monocytes Absolute: 0.8 10*3/uL (ref 0.2–1.0)
Monocytes Relative: 7 %
NEUTROS ABS: 7.7 10*3/uL — AB (ref 1.4–6.5)
Neutrophils Relative %: 67 %
Platelets: 171 10*3/uL (ref 150–440)
RBC: 4.02 MIL/uL — ABNORMAL LOW (ref 4.40–5.90)
RDW: 13.3 % (ref 11.5–14.5)
WBC: 11.7 10*3/uL — AB (ref 3.8–10.6)

## 2014-10-01 LAB — TROPONIN I: Troponin I: <0.03 ng/mL (ref <0.031)

## 2014-10-01 MED ORDER — INSULIN ASPART 100 UNIT/ML ~~LOC~~ SOLN: 0.0000 [IU] | Freq: Three times a day (TID) | SUBCUTANEOUS | Status: DC

## 2014-10-01 MED ORDER — NITROGLYCERIN 0.4 MG SL SUBL
SUBLINGUAL_TABLET | SUBLINGUAL | Status: AC
  Administered 2014-10-01: 0.4 mg via SUBLINGUAL
  Filled 2014-10-01: qty 1

## 2014-10-01 MED ORDER — INSULIN ASPART 100 UNIT/ML ~~LOC~~ SOLN: 0.0000 [IU] | Freq: Every day | SUBCUTANEOUS | Status: DC

## 2014-10-01 MED ORDER — NITROGLYCERIN 0.4 MG SL SUBL
0.4000 mg | SUBLINGUAL_TABLET | SUBLINGUAL | Status: DC | PRN
  Administered 2014-10-01: 0.4 mg via SUBLINGUAL

## 2014-10-01 MED ORDER — INSULIN ASPART 100 UNIT/ML ~~LOC~~ SOLN
6.0000 [IU] | Freq: Three times a day (TID) | SUBCUTANEOUS | Status: DC
  Administered 2014-10-02: 6 [IU] via SUBCUTANEOUS
  Filled 2014-10-01: qty 6

## 2014-10-01 NOTE — ED Notes (Signed)
Brought to [facility], for evaluation of Acute chest pain 8/10, relieved with Nitro. Presents to  Ed A&O*2 speaking full sentences, complaining of chest pain with a hx of AFib and MI. Denies Nausea, Vomiting or SOB. Awaiting further evaluation.

## 2014-10-01 NOTE — ED Provider Notes (Signed)
[facility] Emergency Department Provider Note  ____________________________________________  Time seen: 9:10 PM  I have reviewed the triage vital signs and the nursing notes.   HISTORY  Chief Complaint Chest Pain  History obtained from patient and family  HPI Martin [name] is a 81 y.o. male who has been in his usual state of health recently when today around 8:30 PM he developed rapid onset of left chest pain. He had no associated shortness of breath nausea vomiting or diaphoresis. He uses [place] mobile mobility scooter at baseline and is not able to assess any exertional component. It is not pleuritic or affected by movement or position. At his assisted living facility, they gave him nitroglycerin 3 which lowered the pain from an 8 out of 10 to a 2 out of 10 currently. The pain has not gone away and he describes it as a pressure sensation in the left chest, nonradiating, without identifiable at that identifiable aggravating factors, alleviated somewhat by nitroglycerin, no associated symptoms.  Patient's cardiologist is Dr. Gwen Pounds   Past Medical History  Diagnosis Date  . Coronary artery disease   . CHF (congestive heart failure)     ischemic cardiomyopathy- EF 25%  . Hyperlipidemia   . Diabetes mellitus without complication   . Atrial fibrillation   . BPH (benign prostatic hyperplasia)    hyperlipidemia  Patient Active Problem List   Diagnosis Date Noted  . UTI (lower urinary tract infection) 09/10/2014  . Fall 09/10/2014    Past Surgical History  Procedure Laterality Date  . Cardiac surgery     CABG 2007  Current Outpatient Rx  Name  Route  Sig  Dispense  Refill  . aspirin EC 325 MG tablet   Oral   Take 325 mg by mouth daily.         . carvedilol (COREG) 3.125 MG tablet   Oral   Take 1 tablet (3.125 mg total) by mouth 2 (two) times daily with a meal.         . Docusate Sodium 100 MG capsule   Oral   Take 100 mg by mouth 2 (two)  times daily.         . furosemide (LASIX) 40 MG tablet   Oral   Take 40 mg by mouth every morning.          . furosemide (LASIX) 40 MG tablet   Oral   Take 40 mg by mouth daily as needed (in the afternoon if shortness of breath or weight gain of 2 pounds occur).         . gabapentin (NEURONTIN) 300 MG capsule   Oral   Take 300 mg by mouth 3 (three) times daily.         Marland Kitchen glimepiride (AMARYL) 2 MG tablet   Oral   Take 2 mg by mouth daily with breakfast.         . insulin aspart (NOVOLOG) 100 UNIT/ML FlexPen   Subcutaneous   Inject 8 Units into the skin 3 (three) times daily with meals.   15 mL   11   . insulin glargine (LANTUS) 100 UNIT/ML injection   Subcutaneous   Inject 0.2 mLs (20 Units total) into the skin daily.   10 mL   11   . nitroGLYCERIN (NITROSTAT) 0.4 MG SL tablet   Sublingual   Place 1 tablet (0.4 mg total) under the tongue every 5 (five) minutes as needed for chest pain.   30 tablet  1   . oxyCODONE-acetaminophen (PERCOCET/ROXICET) 5-325 MG per tablet   Oral   Take 0.5 tablets by mouth at bedtime.         Marland Kitchen oxyCODONE-acetaminophen (PERCOCET/ROXICET) 5-325 MG per tablet   Oral   Take 0.5 tablets by mouth every 6 (six) hours as needed for moderate pain or severe pain.         Marland Kitchen oxyCODONE-acetaminophen (PERCOCET/ROXICET) 5-325 MG per tablet   Oral   Take 0.5 tablets by mouth every 6 (six) hours as needed for moderate pain or severe pain.   30 tablet   0   . pravastatin (PRAVACHOL) 20 MG tablet   Oral   Take 20 mg by mouth at bedtime.          . sitaGLIPtin (JANUVIA) 50 MG tablet   Oral   Take 50 mg by mouth daily.         Marland Kitchen terazosin (HYTRIN) 2 MG capsule   Oral   Take 2 mg by mouth daily.          . triazolam (HALCION) 0.25 MG tablet   Oral   Take 0.5 mg by mouth at bedtime.            Allergies Review of patient's allergies indicates no known allergies.  No family history on file.  Social History History   Substance Use Topics  . Smoking status: Never Smoker   . Smokeless tobacco: Not on file  . Alcohol Use: No    Review of Systems  Constitutional: No fever or chills. No weight changes Eyes:No blurry vision or double vision.  ENT: No sore throat. Cardiovascular: Chest pain as above. Respiratory: No dyspnea or cough. Gastrointestinal: Negative for abdominal pain, vomiting and diarrhea.  No BRBPR or melena. Genitourinary: Negative for dysuria, urinary retention, bloody urine, or difficulty urinating. Musculoskeletal: Negative for back pain. No joint swelling or pain. Skin: Negative for rash. Neurological: Negative for headaches, focal weakness or numbness. Psychiatric:No anxiety or depression.   Endocrine:No hot/cold intolerance, changes in energy, or sleep difficulty.  10-point ROS otherwise negative.  ____________________________________________   PHYSICAL EXAM:  VITAL SIGNS: ED Triage Vitals  Enc Vitals Group     BP 10/01/14 1956 138/76 mmHg     Pulse Rate 10/01/14 1956 57     Resp 10/01/14 1956 20     Temp 10/01/14 1956 98.5 F (36.9 C)     Temp Source 10/01/14 1956 Oral     SpO2 10/01/14 1956 97 %     Weight 10/01/14 1956 223 lb (101.152 kg)     Height 10/01/14 1956 6' (1.829 m)     Head Cir --      Peak Flow --      Pain Score --      Pain Loc --      Pain Edu? --      Excl. in GC? --      Constitutional: Alert and oriented to person and place. Well appearing and in no distress. Hard of hearing Eyes: No scleral icterus. No conjunctival pallor. PERRL. EOMI ENT   Head: Normocephalic and atraumatic.   Nose: No congestion/rhinnorhea. No septal hematoma   Mouth/Throat: MMM, no pharyngeal erythema. No peritonsillar mass. No uvula shift.   Neck: No stridor. No SubQ emphysema. No meningismus. Hematological/Lymphatic/Immunilogical: No cervical lymphadenopathy. Cardiovascular: Irregularly irregular rhythm, bradycardic at a rate of 50. Normal and  symmetric distal pulses are present in all extremities. No murmurs, rubs, or gallops. Respiratory: Normal respiratory effort  without tachypnea nor retractions. Breath sounds are clear and equal bilaterally. No wheezes/rales/rhonchi. Chest wall nontender Gastrointestinal: Soft and nontender. No distention. There is no CVA tenderness.  No rebound, rigidity, or guarding. Genitourinary: deferred Musculoskeletal: Nontender with normal range of motion in all extremities. No joint effusions.  No lower extremity tenderness.  No edema. Neurologic:   Normal speech and language.  CN 2-10 normal. Motor grossly intact. No pronator drift.  Normal gait. No gross focal neurologic deficits are appreciated.  Skin:  Skin is warm, dry and intact. No rash noted.  No petechiae, purpura, or bullae. Psychiatric: Mood and affect are normal. Speech and behavior are normal. Patient exhibits appropriate insight and judgment.  ____________________________________________    LABS (pertinent positives/negatives) (all labs ordered are listed, but only abnormal results are displayed) Labs Reviewed  CBC WITH DIFFERENTIAL/PLATELET - Abnormal; Notable for the following:    WBC 11.7 (*)    RBC 4.02 (*)    Hemoglobin 12.5 (*)    HCT 37.4 (*)    Neutro Abs 7.7 (*)    All other components within normal limits  BASIC METABOLIC PANEL - Abnormal; Notable for the following:    BUN 31 (*)    Creatinine, Ser 1.50 (*)    Calcium 8.8 (*)    GFR calc non Af Amer 39 (*)    GFR calc Af Amer 45 (*)    All other components within normal limits  TROPONIN I   ____________________________________________   EKG  Interpreted by me Atrial fibrillation with slow ventricular response at a rate of 50, left axis normal intervals left bundle-branch block and poor R-wave progression in anterior leads, normal ST segments, T-wave inversions in 1 and aVL and V5 and V6 Unchanged from 09/10/2014 ____________________________________________     RADIOLOGY  Chest x-ray unremarkable  ____________________________________________   PROCEDURES  ____________________________________________   INITIAL IMPRESSION / ASSESSMENT AND PLAN / ED COURSE  Pertinent labs & imaging results that were available during my care of the patient were reviewed by me and considered in my medical decision making (see chart for details).  Symptoms and risk factors concerning for cardiac chest pain. The pain did get better with nitroglycerin but has not yet resolved. We'll continue to give him intermittent nitroglycerin to see if we can resolve the pain since it is improving quite a bit. Due to his high risk comorbidities, I will discuss the patient's case with the hospitalist for observation overnight and cardiology consult.  ____________________________________________   FINAL CLINICAL IMPRESSION(S) / ED DIAGNOSES  Final diagnoses:  Precordial pain   precordial chest pain    Sharman CheekPhillip Roger Kettles, MD 10/01/14 2321

## 2014-10-01 NOTE — H&P (Signed)
[facility] [place] [facility]   PATIENT NAME: Martin [name]    MR#:  [id number]  DATE OF BIRTH:  August 20, 1921  DATE OF ADMISSION:  10/01/2014  PRIMARY CARE PHYSICIAN: Mila Merry, MD   REQUESTING/REFERRING PHYSICIAN: Dr. Scotty Court  CHIEF COMPLAINT:   Chief Complaint  Patient presents with  . Chest Pain    HISTORY OF PRESENT ILLNESS:  Martin [name]  is a 81 y.o. male with a known history of atrial fibrillation, coronary artery disease status post CABG, congestive heart failure with ejection fraction 25% presents from the [place] [facility] [place] with chest pain. He describes pain which started while he was watching television. Pain was in the left chest radiating to the left arm and was accompanied by some shortness of breath. No nausea, diaphoresis or syncope. He received nitroglycerin at the skilled nursing facility and aspirin by EMS and feels much better. He states that he still has a "funny" feeling in his left chest but it is no longer painful. To the ED he has nonspecific changes to his EKG, initial set of troponins negative he is being admitted for ACS rule out.  PAST MEDICAL HISTORY:   Past Medical History  Diagnosis Date  . Coronary artery disease   . CHF (congestive heart failure)     ischemic cardiomyopathy- EF 25%  . Hyperlipidemia   . Diabetes mellitus without complication   . Atrial fibrillation   . BPH (benign prostatic hyperplasia)   . Peripheral vascular disease   . Chronic kidney disease     PAST SURGICAL HISTORY:   Past Surgical History  Procedure Laterality Date  . Cardiac surgery    . Coronary artery bypass graft      SOCIAL HISTORY:   History  Substance Use Topics  . Smoking status: Never Smoker   . Smokeless tobacco: Not on file  . Alcohol Use: No    FAMILY HISTORY:  History reviewed. No pertinent family history.  DRUG ALLERGIES:  No Known Allergies  REVIEW OF SYSTEMS:   Review of Systems  Constitutional:  Negative for fever, chills, weight loss, malaise/fatigue and diaphoresis.  HENT: Negative for sore throat.   Eyes: Negative for blurred vision, double vision and pain.  Respiratory: Negative for cough, hemoptysis, sputum production, shortness of breath and wheezing.   Cardiovascular: Positive for chest pain and leg swelling. Negative for palpitations, orthopnea, claudication and PND.  Gastrointestinal: Negative for heartburn, nausea, vomiting, abdominal pain, diarrhea and constipation.  Genitourinary: Negative for dysuria and frequency.  Musculoskeletal: Negative for myalgias, back pain and neck pain.  Skin: Negative for itching and rash.  Neurological: Negative for dizziness, sensory change, loss of consciousness, weakness and headaches.  Endo/Heme/Allergies: Does not bruise/bleed easily.    MEDICATIONS AT HOME:   Prior to Admission medications   Medication Sig Start Date End Date Taking? Authorizing Provider  aspirin EC 325 MG tablet Take 325 mg by mouth daily.    Historical Provider, MD  carvedilol (COREG) 3.125 MG tablet Take 1 tablet (3.125 mg total) by mouth 2 (two) times daily with a meal. 09/12/14   Houston Siren, MD  Docusate Sodium 100 MG capsule Take 100 mg by mouth 2 (two) times daily.    Historical Provider, MD  furosemide (LASIX) 40 MG tablet Take 40 mg by mouth every morning.     Historical Provider, MD  furosemide (LASIX) 40 MG tablet Take 40 mg by mouth daily as needed (in the afternoon if shortness of breath or weight  gain of 2 pounds occur).    Historical Provider, MD  gabapentin (NEURONTIN) 300 MG capsule Take 300 mg by mouth 3 (three) times daily.    Historical Provider, MD  glimepiride (AMARYL) 2 MG tablet Take 2 mg by mouth daily with breakfast.    Historical Provider, MD  insulin aspart (NOVOLOG) 100 UNIT/ML FlexPen Inject 8 Units into the skin 3 (three) times daily with meals. 09/12/14   Houston SirenVivek J Sainani, MD  insulin glargine (LANTUS) 100 UNIT/ML injection Inject 0.2  mLs (20 Units total) into the skin daily. 09/12/14   Houston SirenVivek J Sainani, MD  nitroGLYCERIN (NITROSTAT) 0.4 MG SL tablet Place 1 tablet (0.4 mg total) under the tongue every 5 (five) minutes as needed for chest pain. 09/12/14   Houston SirenVivek J Sainani, MD  oxyCODONE-acetaminophen (PERCOCET/ROXICET) 5-325 MG per tablet Take 0.5 tablets by mouth at bedtime.    Historical Provider, MD  oxyCODONE-acetaminophen (PERCOCET/ROXICET) 5-325 MG per tablet Take 0.5 tablets by mouth every 6 (six) hours as needed for moderate pain or severe pain.    Historical Provider, MD  oxyCODONE-acetaminophen (PERCOCET/ROXICET) 5-325 MG per tablet Take 0.5 tablets by mouth every 6 (six) hours as needed for moderate pain or severe pain. 09/12/14   Houston SirenVivek J Sainani, MD  pravastatin (PRAVACHOL) 20 MG tablet Take 20 mg by mouth at bedtime.     Historical Provider, MD  sitaGLIPtin (JANUVIA) 50 MG tablet Take 50 mg by mouth daily.    Historical Provider, MD  terazosin (HYTRIN) 2 MG capsule Take 2 mg by mouth daily.     Historical Provider, MD  triazolam (HALCION) 0.25 MG tablet Take 0.5 mg by mouth at bedtime.     Historical Provider, MD      VITAL SIGNS:  Blood pressure 155/67, pulse 61, temperature 98.5 F (36.9 C), temperature source Oral, resp. rate 19, height 6' (1.829 m), weight 101.152 kg (223 lb), SpO2 98 %.  PHYSICAL EXAMINATION:  GENERAL:  81 y.o.-year-old patient lying in the bed with no acute distress.  EYES: Pupils equal, round, reactive to light and accommodation. No scleral icterus. Extraocular muscles intact.  HEENT: Head atraumatic, normocephalic. Oropharynx and nasopharynx clear. Oral mucous membranes are dry,  NECK:  Supple, no jugular venous distention. No thyroid enlargement, no tenderness.  LUNGS: Normal breath sounds bilaterally, no wheezing, rales,rhonchi or crepitation. No use of accessory muscles of respiration.  CARDIOVASCULAR: S1, S2 normal. No murmurs, rubs, or gallops. Tenderness to palpation over the left chest   ABDOMEN: Soft, nontender, nondistended. Bowel sounds present. No organomegaly or mass.  EXTREMITIES: +1 pedal edema bilaterally, cyanosis, or clubbing.  NEUROLOGIC: Cranial nerves II through XII are intact. Muscle strength 5/5 in all extremities. Sensation intact. Gait not checked.  PSYCHIATRIC: The patient is alert and oriented x 3.  SKIN: No obvious rash, lesion, or ulcer.   LABORATORY PANEL:   CBC  Recent Labs Lab 10/01/14 2003  WBC 11.7*  HGB 12.5*  HCT 37.4*  PLT 171   ------------------------------------------------------------------------------------------------------------------  Chemistries   Recent Labs Lab 10/01/14 2003  NA 140  K 3.7  CL 104  CO2 28  GLUCOSE 82  BUN 31*  CREATININE 1.50*  CALCIUM 8.8*   ------------------------------------------------------------------------------------------------------------------  Cardiac Enzymes  Recent Labs Lab 10/01/14 2003  TROPONINI <0.03   ------------------------------------------------------------------------------------------------------------------  RADIOLOGY:  Dg Chest Port 1 View  10/01/2014   CLINICAL DATA:  Altered mental status.  EXAM: PORTABLE CHEST - 1 VIEW  COMPARISON:  05/17/2014  FINDINGS: Chronic cardiomegaly. The patient is status  post median sternotomy for CABG. Stable aortic and hilar contours. Mild hyperinflation. There is no edema, consolidation, effusion, or pneumothorax.  IMPRESSION: No active disease.   Electronically Signed   By: Marnee Spring M.D.   On: 10/01/2014 22:25    EKG:   Orders placed or performed during the hospital encounter of 09/10/14  . EKG 12-Lead  . EKG 12-Lead  . EKG 12-Lead  . EKG 12-Lead  . EKG 12-Lead  . EKG 12-Lead  . EKG    IMPRESSION AND PLAN:   Principal Problem:   Chest pain Active Problems:   Diabetes mellitus type 2 in obese   Atrial fibrillation   Chronic kidney disease, stage III (moderate)   Systolic congestive heart failure    Peripheral vascular disease   Coronary artery disease  Problem #1 chest pain: Patient with multiple risk factors including age, coronary artery disease, congestive heart failure, diabetes presenting with left-sided chest pain radiating to the left arm. He does seem to have reproducible pain with palpation of the left chest wall. Will admit to telemetry, cycle cardiac enzymes, administer high-dose statin, aspirin. Will hold beta blocker as his heart rate is low at this time. Initial cardiac enzymes are normal I will not start full dose anticoagulation. Looks like his last 2-D echocardiogram was in May 2015 I will hold off on repeat study at this time. As initial labs are negative I will hold off on cardiology consultation. He follows with Dr. Gwen Pounds  #2 atrial fibrillation: Rate low at this time at 50 beats. Hold Coreg. He is on full dose aspirin for anticoagulation and I will continue this.  #3 diabetes mellitus type 2: Check hemoglobin A1c. Continue with Lantus, 8 units Humalog with meals and sliding scale. Carb modified diet. Hold oral hypoglycemics.  #4 congestive heart failure ejection fraction 25%: No signs of exacerbation at this time. Continue with furosemide. Fluid restriction. I&O and daily weights.   All the records are reviewed and case discussed with ED provider. Management plans discussed with the patient, family and they are in agreement.  CODE STATUS: Full. This was discussed with the patient on admission   TOTAL TIME TAKING CARE OF THIS PATIENT: 45 minutes.    Elby Showers M.D on 10/01/2014 at 11:36 PM  Between 7am to 6pm - Pager - [phone]  After 6pm go to [email or web address] - password [email or web address] [facility]  [facility]  Office  [phone]  CC: Primary care physician; Mila Merry, MD

## 2014-10-02 DIAGNOSIS — R600 Localized edema: Secondary | ICD-10-CM | POA: Diagnosis not present

## 2014-10-02 DIAGNOSIS — I25118 Atherosclerotic heart disease of native coronary artery with other forms of angina pectoris: Secondary | ICD-10-CM | POA: Diagnosis not present

## 2014-10-02 DIAGNOSIS — I482 Chronic atrial fibrillation: Secondary | ICD-10-CM | POA: Diagnosis not present

## 2014-10-02 DIAGNOSIS — I509 Heart failure, unspecified: Secondary | ICD-10-CM | POA: Diagnosis not present

## 2014-10-02 DIAGNOSIS — R079 Chest pain, unspecified: Secondary | ICD-10-CM | POA: Diagnosis not present

## 2014-10-02 DIAGNOSIS — R001 Bradycardia, unspecified: Secondary | ICD-10-CM | POA: Diagnosis not present

## 2014-10-02 DIAGNOSIS — E1122 Type 2 diabetes mellitus with diabetic chronic kidney disease: Secondary | ICD-10-CM | POA: Diagnosis not present

## 2014-10-02 DIAGNOSIS — I739 Peripheral vascular disease, unspecified: Secondary | ICD-10-CM | POA: Diagnosis not present

## 2014-10-02 DIAGNOSIS — R29898 Other symptoms and signs involving the musculoskeletal system: Secondary | ICD-10-CM | POA: Diagnosis not present

## 2014-10-02 DIAGNOSIS — I25798 Atherosclerosis of other coronary artery bypass graft(s) with other forms of angina pectoris: Secondary | ICD-10-CM | POA: Diagnosis not present

## 2014-10-02 DIAGNOSIS — R072 Precordial pain: Secondary | ICD-10-CM | POA: Diagnosis not present

## 2014-10-02 LAB — TROPONIN I

## 2014-10-02 LAB — LIPID PANEL
CHOL/HDL RATIO: 4.6 ratio
Cholesterol: 147 mg/dL (ref 0–200)
HDL: 32 mg/dL — ABNORMAL LOW (ref >40)
LDL Cholesterol: 99 mg/dL (ref 0–99)
Triglycerides: 81 mg/dL (ref <150)
VLDL: 16 mg/dL (ref 0–40)

## 2014-10-02 LAB — GLUCOSE, CAPILLARY
GLUCOSE-CAPILLARY: 91 mg/dL (ref 65–99)
GLUCOSE-CAPILLARY: 107 mg/dL — AB (ref 65–99)
Glucose-Capillary: 133 mg/dL — ABNORMAL HIGH (ref 65–99)

## 2014-10-02 LAB — CKMB (ARMC ONLY)
CK, MB: 0.3 ng/mL — ABNORMAL LOW (ref 0.5–5.0)
CK, MB: 3.4 ng/mL (ref 0.5–5.0)

## 2014-10-02 LAB — HEMOGLOBIN A1C: Hgb A1c MFr Bld: 12.4 % — ABNORMAL HIGH (ref 4.0–6.0)

## 2014-10-02 MED ORDER — OXYCODONE-ACETAMINOPHEN 5-325 MG PO TABS: 0.5000 | ORAL_TABLET | Freq: Every day | ORAL | Status: DC

## 2014-10-02 MED ORDER — TRIAZOLAM 0.25 MG PO TABS: 0.5000 mg | ORAL_TABLET | Freq: Every day | ORAL | Status: DC

## 2014-10-02 MED ORDER — OXYCODONE-ACETAMINOPHEN 5-325 MG PO TABS: 0.5000 | ORAL_TABLET | Freq: Four times a day (QID) | ORAL | Status: DC | PRN

## 2014-10-02 MED ORDER — TERAZOSIN HCL 1 MG PO CAPS
2.0000 mg | ORAL_CAPSULE | Freq: Every day | ORAL | Status: DC
  Administered 2014-10-02: 2 mg via ORAL
  Filled 2014-10-02: qty 2

## 2014-10-02 MED ORDER — NITROGLYCERIN 0.4 MG SL SUBL: 0.4000 mg | SUBLINGUAL_TABLET | SUBLINGUAL | Status: DC | PRN

## 2014-10-02 MED ORDER — DOCUSATE SODIUM 100 MG PO CAPS
100.0000 mg | ORAL_CAPSULE | Freq: Two times a day (BID) | ORAL | Status: DC
  Administered 2014-10-02: 100 mg via ORAL
  Filled 2014-10-02: qty 1

## 2014-10-02 MED ORDER — HEPARIN SODIUM (PORCINE) 5000 UNIT/ML IJ SOLN
5000.0000 [IU] | Freq: Three times a day (TID) | INTRAMUSCULAR | Status: DC
  Administered 2014-10-02: 5000 [IU] via SUBCUTANEOUS
  Filled 2014-10-02: qty 1

## 2014-10-02 MED ORDER — ACETAMINOPHEN 325 MG PO TABS: 650.0000 mg | ORAL_TABLET | ORAL | Status: DC | PRN

## 2014-10-02 MED ORDER — ATORVASTATIN CALCIUM 20 MG PO TABS: 80.0000 mg | ORAL_TABLET | Freq: Every day | ORAL | Status: DC

## 2014-10-02 MED ORDER — ONDANSETRON HCL 4 MG/2ML IJ SOLN: 4.0000 mg | Freq: Four times a day (QID) | INTRAMUSCULAR | Status: DC | PRN

## 2014-10-02 MED ORDER — INSULIN GLARGINE 100 UNIT/ML ~~LOC~~ SOLN
20.0000 [IU] | Freq: Every day | SUBCUTANEOUS | Status: DC
  Administered 2014-10-02: 20 [IU] via SUBCUTANEOUS
  Filled 2014-10-02 (×2): qty 0.2

## 2014-10-02 MED ORDER — ASPIRIN EC 325 MG PO TBEC
325.0000 mg | DELAYED_RELEASE_TABLET | Freq: Every day | ORAL | Status: DC
  Administered 2014-10-02: 325 mg via ORAL
  Filled 2014-10-02: qty 1

## 2014-10-02 MED ORDER — GABAPENTIN 300 MG PO CAPS
300.0000 mg | ORAL_CAPSULE | Freq: Three times a day (TID) | ORAL | Status: DC
  Administered 2014-10-02: 300 mg via ORAL
  Filled 2014-10-02: qty 1

## 2014-10-02 MED ORDER — FUROSEMIDE 40 MG PO TABS
40.0000 mg | ORAL_TABLET | ORAL | Status: DC
  Administered 2014-10-02: 40 mg via ORAL
  Filled 2014-10-02: qty 1

## 2014-10-02 NOTE — Discharge Summary (Signed)
[facility] [place] at [facility]   PATIENT NAME: Martin RosenthalWalter [name]    MR#:  [id number]  DATE OF BIRTH:  07-19-1921  DATE OF ADMISSION:  10/01/2014 ADMITTING PHYSICIAN: Gale Journeyatherine P Walsh, MD  DATE OF DISCHARGE: 10/02/14  PRIMARY CARE PHYSICIAN: Mila MerryFISHER, DONALD, MD   PRIMARY CARDIOLOGIST: DR Gwen PoundsKOWALSKI   ADMISSION DIAGNOSIS:  Precordial pain [R07.2]  DISCHARGE DIAGNOSIS:  1. Chest pain resolved 2. Bradycarida-asymptomatic-resolved. OFF coreg 3.CARD s/p CABG  SECONDARY DIAGNOSIS:   Past Medical History  Diagnosis Date  . Coronary artery disease   . CHF (congestive heart failure)     ischemic cardiomyopathy- EF 25%  . Hyperlipidemia   . Diabetes mellitus without complication   . Atrial fibrillation   . BPH (benign prostatic hyperplasia)   . Peripheral vascular disease   . Chronic kidney disease     HOSPITAL COURSE:  81 yr old WM from [facility] with    #1 chest pain: Patient with multiple risk factors including age, coronary artery disease, congestive heart failure, diabetes presenting with left-sided chest pain radiating to the left arm. He does seem to have reproducible pain with palpation of the left chest wall.  - negative cardiac enzymes -cont statin, aspirin. - Will hold beta blocker as his heart rate is low at this time. -Seen by Dr Cassie FreerParachos. No anticoagulation and further w/u at present  #2 chronic atrial fibrillation: Rate low at this time at 50 beats. Hold Coreg. He is on full dose aspirin for anticoagulation and will continue this. -Dr Cassie Freerparachos agrees to hold coreg -HR now in the 70's  #3 diabetes mellitus type 2:  - Continue with Lantus, 8 units Humalog with meals and sliding scale. Carb modified diet.  -resume home meds  #4 congestive heart failure ejection fraction 25%: No signs of exacerbation at this time. Continue with furosemide. Fluid restriction. I&O and daily weights.  -Overall stable. Pt feels at baseline. Requesting to go  back to [facility] aware -will d/c later today    DISCHARGE CONDITIONS:   fair  CONSULTS OBTAINED:  Treatment Team:  Marcina MillardAlexander Paraschos, MD  DRUG ALLERGIES:  No Known Allergies  DISCHARGE MEDICATIONS:   Current Discharge Medication List    CONTINUE these medications which have NOT CHANGED   Details  aspirin EC 325 MG tablet Take 325 mg by mouth daily.    Docusate Sodium 100 MG capsule Take 100 mg by mouth 2 (two) times daily.    !! furosemide (LASIX) 40 MG tablet Take 40 mg by mouth every morning.     !! furosemide (LASIX) 40 MG tablet Take 40 mg by mouth daily as needed (in the afternoon if shortness of breath or weight gain of 2 pounds occur).    gabapentin (NEURONTIN) 300 MG capsule Take 300 mg by mouth 3 (three) times daily.    glimepiride (AMARYL) 2 MG tablet Take 2 mg by mouth daily with breakfast.    insulin aspart (NOVOLOG) 100 UNIT/ML FlexPen Inject 8 Units into the skin 3 (three) times daily with meals. Qty: 15 mL, Refills: 11    insulin glargine (LANTUS) 100 UNIT/ML injection Inject 0.2 mLs (20 Units total) into the skin daily. Qty: 10 mL, Refills: 11    nitroGLYCERIN (NITROSTAT) 0.4 MG SL tablet Place 1 tablet (0.4 mg total) under the tongue every 5 (five) minutes as needed for chest pain. Qty: 30 tablet, Refills: 1    !! oxyCODONE-acetaminophen (PERCOCET/ROXICET) 5-325 MG per tablet Take 0.5 tablets by mouth  at bedtime.    !! oxyCODONE-acetaminophen (PERCOCET/ROXICET) 5-325 MG per tablet Take 0.5 tablets by mouth every 6 (six) hours as needed for moderate pain or severe pain.    pravastatin (PRAVACHOL) 20 MG tablet Take 20 mg by mouth at bedtime.     sitaGLIPtin (JANUVIA) 50 MG tablet Take 50 mg by mouth daily.    terazosin (HYTRIN) 2 MG capsule Take 2 mg by mouth daily.     triazolam (HALCION) 0.25 MG tablet Take 0.5 mg by mouth at bedtime.      !! - Potential duplicate medications found. Please discuss with provider.    STOP taking these  medications     carvedilol (COREG) 3.125 MG tablet          DISCHARGE INSTRUCTIONS:   As above  If you experience worsening of your admission symptoms, develop shortness of breath, life threatening emergency, suicidal or homicidal thoughts you must seek medical attention immediately by calling 911 or calling your MD immediately  if symptoms less severe.  You Must read complete instructions/literature along with all the possible adverse reactions/side effects for all the Medicines you take and that have been prescribed to you. Take any new Medicines after you have completely understood and accept all the possible adverse reactions/side effects.   Please note  You were cared for by a hospitalist during your hospital stay. If you have any questions about your discharge medications or the care you received while you were in the hospital after you are discharged, you can call the unit and asked to speak with the hospitalist on call if the hospitalist that took care of you is not available. Once you are discharged, your primary care physician will handle any further medical issues. Please note that NO REFILLS for any discharge medications will be authorized once you are discharged, as it is imperative that you return to your primary care physician (or establish a relationship with a primary care physician if you do not have one) for your aftercare needs so that they can reassess your need for medications and monitor your lab values.    Today   SUBJECTIVE   Feels fine. No cp. W[facility] to go back to the [facility]   VITAL SIGNS:  Blood pressure 150/69, pulse 65, temperature 98.7 F (37.1 C), temperature source Oral, resp. rate 16, height 6' (1.829 m), weight 101.061 kg (222 lb 12.8 oz), SpO2 95 %.  I/O:   Intake/Output Summary (Last 24 hours) at 10/02/14 1108 Last data filed at 10/02/14 0930  Gross per 24 hour  Intake    240 ml  Output   1525 ml  Net  -1285 ml    PHYSICAL EXAMINATION:   GENERAL:  81 y.o.-year-old patient lying in the bed with no acute distress.  EYES: Pupils equal, round, reactive to light and accommodation. No scleral icterus. Extraocular muscles intact.  HEENT: Head atraumatic, normocephalic. Oropharynx and nasopharynx clear.  NECK:  Supple, no jugular venous distention. No thyroid enlargement, no tenderness.  LUNGS: Normal breath sounds bilaterally, no wheezing, rales,rhonchi or crepitation. No use of accessory muscles of respiration.  CARDIOVASCULAR: S1, S2 normal. No murmurs, rubs, or gallops.  ABDOMEN: Soft, non-tender, non-distended. Bowel sounds present. No organomegaly or mass.  EXTREMITIES: No pedal edema, cyanosis, or clubbing.  NEUROLOGIC: Cranial nerves II through XII are intact. Muscle strength 5/5 in all extremities. Sensation intact. Gait not checked.  PSYCHIATRIC: The patient is alert and oriented x 3.  SKIN: No obvious rash, lesion, or ulcer.  DATA REVIEW:   CBC  Recent Labs Lab 10/01/14 2003  WBC 11.7*  HGB 12.5*  HCT 37.4*  PLT 171    Chemistries   Recent Labs Lab 10/01/14 2003  NA 140  K 3.7  CL 104  CO2 28  GLUCOSE 82  BUN 31*  CREATININE 1.50*  CALCIUM 8.8*    Cardiac Enzymes  Recent Labs Lab 10/02/14 0420  TROPONINI <0.03    Microbiology Results   No results found for this or any previous visit (from the past 240 hour(s)).  RADIOLOGY:  Dg Chest Port 1 View  10/01/2014   CLINICAL DATA:  Altered mental status.  EXAM: PORTABLE CHEST - 1 VIEW  COMPARISON:  05/17/2014  FINDINGS: Chronic cardiomegaly. The patient is status post median sternotomy for CABG. Stable aortic and hilar contours. Mild hyperinflation. There is no edema, consolidation, effusion, or pneumothorax.  IMPRESSION: No active disease.   Electronically Signed   By: Marnee Spring M.D.   On: 10/01/2014 22:25       Management plans discussed with the patient, family and they are in agreement.  CODE STATUS:     Code Status Orders         Start     Ordered   10/02/14 0306  Full code   Continuous     10/02/14 0305      TOTAL TIME TAKING CARE OF THIS PATIENT:51minutes.    Matha Masse M.D on 10/02/2014 at 11:08 AM  Between 7am to 6pm - Pager - [phone] After 6pm go to [email or web address] - password [email or web address] [facility]  [place] [facility]  Office  [phone]  CC: Primary care physician; Mila Merry, MD

## 2014-10-02 NOTE — ED Notes (Signed)
Report called to Alisa,RN on 2a

## 2014-10-02 NOTE — Progress Notes (Signed)
Received pt. From ER via stretcher. Resident is hard of hearing and wear bilateral hearing aides. Also wears glasses and has upper and lower dentures.  Resident oriented to room, call bell and bed controls. Skin assessment completed with second RN Crisoforo Oxfordharlotte Kyei. Telemetry in place. Troponin labs are negative at this time. Pt. Has a pimple to left upper groin. Sacrum was red but blanchable. 1+ non-pitting edema to BLE. Will continue to montior pt.

## 2014-10-02 NOTE — Care Management (Signed)
Family at bedside and report that they will transport Mr Martin [name] back to [facility] today as soon as they receive discharge paperwork [facility]. Mr Martin [name] has been in the hospital less than 24 hours and does not need a FL2. No new med orders by MD per Discharge Summary.

## 2014-10-02 NOTE — Progress Notes (Signed)
Pt to be discharged back to the [facility] today. Iv and tele removed. disch instructions given to pt's family. Family to provide transportation

## 2014-10-02 NOTE — Consult Note (Signed)
[facility] Cardiology  CARDIOLOGY CONSULT NOTE  Patient ID: Martin [name] MRN: [id number] DOB/AGE: 08-09-1921 81 y.o.  Admit date: 10/01/2014 Referring Physician Enedina Finner M.D.  Primary Physician Mila Merry M.D. Primary Cardiologist Arnoldo Hooker M.D. Reason for Consultation chest pain  HPI: 81 year old gentleman referred for evaluation of chief complaint of chest pain. Patient has a history of coronary artery disease, status post CABG, with ischemic cardiomyopathy, and chronic atrial fibrillation. The patient is a resident at the [address] He was in his usual state of health until last evening when he experienced focal discomfort side of his chest and radiation to his left arm. She was brought to [facility] via EMS which time he was feeling better. EKG was nondiagnostic. Patient has ruled out for myocardial infarction by CPK isoenzymes and troponin. Patient is chest pain today. Patient denies associated nausea, vomiting or diaphoresis.  The patient has chronic atrial fibrillation. Telemetry has revealed atrial fibrillation with a slow ventricular rate in the 50s. Review of old records of shown prior Holter monitor him 11/04/13 which revealed each fibrillation with a mean ventricular rate of 60 bpm the minimum rate of 51 bpm and maximum rate of 75 bpm. According to family members he's had a long history of bradycardia. The patient currently is not on chronic anticoagulation due to his advanced age. He apparently lives a very sedentary lifestyle, mostly wheelchair-bound, with very little activity.  Review of systems complete and found to be negative unless listed above     Past Medical History  Diagnosis Date  . Coronary artery disease   . CHF (congestive heart failure)     ischemic cardiomyopathy- EF 25%  . Hyperlipidemia   . Diabetes mellitus without complication   . Atrial fibrillation   . BPH (benign prostatic hyperplasia)   . Peripheral vascular disease   . Chronic kidney disease      Past Surgical History  Procedure Laterality Date  . Cardiac surgery    . Coronary artery bypass graft      Prescriptions prior to admission  Medication Sig Dispense Refill Last Dose  . aspirin EC 325 MG tablet Take 325 mg by mouth daily.   unknown  . carvedilol (COREG) 3.125 MG tablet Take 1 tablet (3.125 mg total) by mouth 2 (two) times daily with a meal.     . Docusate Sodium 100 MG capsule Take 100 mg by mouth 2 (two) times daily.   unknown at unknown  . furosemide (LASIX) 40 MG tablet Take 40 mg by mouth every morning.    unknown at unknown  . furosemide (LASIX) 40 MG tablet Take 40 mg by mouth daily as needed (in the afternoon if shortness of breath or weight gain of 2 pounds occur).   prn at prn  . gabapentin (NEURONTIN) 300 MG capsule Take 300 mg by mouth 3 (three) times daily.   unknown at unknown  . glimepiride (AMARYL) 2 MG tablet Take 2 mg by mouth daily with breakfast.   Taking  . insulin aspart (NOVOLOG) 100 UNIT/ML FlexPen Inject 8 Units into the skin 3 (three) times daily with meals. 15 mL 11   . insulin glargine (LANTUS) 100 UNIT/ML injection Inject 0.2 mLs (20 Units total) into the skin daily. 10 mL 11   . nitroGLYCERIN (NITROSTAT) 0.4 MG SL tablet Place 1 tablet (0.4 mg total) under the tongue every 5 (five) minutes as needed for chest pain. 30 tablet 1   . oxyCODONE-acetaminophen (PERCOCET/ROXICET) 5-325 MG per tablet Take 0.5  tablets by mouth at bedtime.   unknown at unknown  . oxyCODONE-acetaminophen (PERCOCET/ROXICET) 5-325 MG per tablet Take 0.5 tablets by mouth every 6 (six) hours as needed for moderate pain or severe pain.   prn at prn  . oxyCODONE-acetaminophen (PERCOCET/ROXICET) 5-325 MG per tablet Take 0.5 tablets by mouth every 6 (six) hours as needed for moderate pain or severe pain. 30 tablet 0   . pravastatin (PRAVACHOL) 20 MG tablet Take 20 mg by mouth at bedtime.    unknown at unknown  . sitaGLIPtin (JANUVIA) 50 MG tablet Take 50 mg by mouth daily.   unknown  at unknown  . terazosin (HYTRIN) 2 MG capsule Take 2 mg by mouth daily.    unknown at unknown  . triazolam (HALCION) 0.25 MG tablet Take 0.5 mg by mouth at bedtime.    unknown at unknown   History   Social History  . Marital Status: Single    Spouse Name: N/A  . Number of Children: N/A  . Years of Education: N/A   Occupational History  . Not on file.   Social History Main Topics  . Smoking status: Never Smoker   . Smokeless tobacco: Not on file  . Alcohol Use: No  . Drug Use: No  . Sexual Activity: Not on file   Other Topics Concern  . Not on file   Social History Narrative    History reviewed. No pertinent family history.    Review of systems complete and found to be negative unless listed above      PHYSICAL EXAM  General: Well developed, well nourished, in no acute distress HEENT:  Normocephalic and atramatic Neck:  No JVD.  Lungs: Clear bilaterally to auscultation and percussion. Heart: Irregularly irregular rhythm. Normal S1 and S2 without gallops or murmurs.  Abdomen: Bowel sounds are positive, abdomen soft and non-tender  Msk:  Back normal, normal gait. Normal strength and tone for age. Extremities: No clubbing, cyanosis or edema.   Neuro: Alert and oriented X 3. Psych:  Good affect, responds appropriately  Labs:   Lab Results  Component Value Date   WBC 11.7* 10/01/2014   HGB 12.5* 10/01/2014   HCT 37.4* 10/01/2014   MCV 93.0 10/01/2014   PLT 171 10/01/2014    Recent Labs Lab 10/01/14 2003  NA 140  K 3.7  CL 104  CO2 28  BUN 31*  CREATININE 1.50*  CALCIUM 8.8*  GLUCOSE 82   Lab Results  Component Value Date   CKMB 0.3* 10/02/2014   TROPONINI <0.03 10/02/2014    Lab Results  Component Value Date   CHOL 147 10/02/2014   Lab Results  Component Value Date   HDL 32* 10/02/2014   Lab Results  Component Value Date   LDLCALC 99 10/02/2014   Lab Results  Component Value Date   TRIG 81 10/02/2014   Lab Results  Component Value  Date   CHOLHDL 4.6 10/02/2014   No results found for: LDLDIRECT    Radiology: Dg Wrist Complete Right  09/10/2014   CLINICAL DATA:  Generalized LEFT shoulder and RIGHT wrist pain after fall tonight.  EXAM: RIGHT WRIST - COMPLETE 3+ VIEW  COMPARISON:  None.  FINDINGS: No acute fracture deformity or dislocation. Remote radial styloid fracture. Mild periarticular fluffy calcifications can be seen with CPPD. Moderate lateral wrist osteoarthrosis. Joint space intact without erosions. No destructive bony lesions. Soft tissue planes are not suspicious.  IMPRESSION: No acute fracture deformity or dislocation.  Remote radial styloid fracture.  Probable CPPD.   Electronically Signed   By: Awilda Metroourtnay  Bloomer   On: 09/10/2014 03:42   Dg Chest Port 1 View  10/01/2014   CLINICAL DATA:  Altered mental status.  EXAM: PORTABLE CHEST - 1 VIEW  COMPARISON:  05/17/2014  FINDINGS: Chronic cardiomegaly. The patient is status post median sternotomy for CABG. Stable aortic and hilar contours. Mild hyperinflation. There is no edema, consolidation, effusion, or pneumothorax.  IMPRESSION: No active disease.   Electronically Signed   By: Marnee SpringJonathon  Watts M.D.   On: 10/01/2014 22:25   Dg Shoulder Left  09/10/2014   CLINICAL DATA:  Generalized shoulder and wrist pain after fall tonight.  EXAM: LEFT SHOULDER - 2+ VIEW  COMPARISON:  None.  FINDINGS: The humeral head is well-formed and located. The subacromial, glenohumeral and acromioclavicular joint spaces are intact. No destructive bony lesions. Soft tissue planes are non-suspicious. Mild vascular calcifications.  IMPRESSION: Negative.   Electronically Signed   By: Awilda Metroourtnay  Bloomer   On: 09/10/2014 03:40    EKG: Atrial fibrillation with a slow ventricular response  ASSESSMENT AND PLAN:   1. Chest pain, with atypical features, with resolution, with negative troponin. 2. Coronary artery disease, status post CABG, stable 3. Ischemic cardiomyopathy, known LV ejection fraction of  25%, without evidence of congestion of heart failure 4. Chronic atrial fibrillation, chads Vasc score of 5, currently on aspirin  Recommendations  1. Agree with overall current therapy 2. Defer chronic anticoagulation 3. For further cardiac diagnostics at this time 4. May hold carvedilol for bradycardia 5. Follow-up with Dr. Gwen PoundsKowalski as outpatient  Signed: Marcina MillardPARASCHOS,Janessa Mickle MD,PhD, [facility] 10/02/2014, 9:45 AM

## 2014-10-02 NOTE — Discharge Instructions (Signed)
Use your walker and powered chair as before at the [facility]

## 2014-10-11 DIAGNOSIS — I5033 Acute on chronic diastolic (congestive) heart failure: Secondary | ICD-10-CM | POA: Diagnosis not present

## 2014-10-11 DIAGNOSIS — R001 Bradycardia, unspecified: Secondary | ICD-10-CM | POA: Diagnosis not present

## 2014-10-11 DIAGNOSIS — I482 Chronic atrial fibrillation: Secondary | ICD-10-CM | POA: Diagnosis not present

## 2014-10-16 ENCOUNTER — Emergency Department: Payer: Medicare Other

## 2014-10-16 ENCOUNTER — Emergency Department
Admission: EM | Admit: 2014-10-16 | Discharge: 2014-10-16 | Disposition: A | Discharge status: Home / Self Care | Payer: Medicare Other | Attending: Emergency Medicine | Admitting: Emergency Medicine

## 2014-10-16 DIAGNOSIS — W06XXXA Fall from bed, initial encounter: Secondary | ICD-10-CM | POA: Insufficient documentation

## 2014-10-16 DIAGNOSIS — K1120 Sialoadenitis, unspecified: Secondary | ICD-10-CM | POA: Insufficient documentation

## 2014-10-16 DIAGNOSIS — M79604 Pain in right leg: Secondary | ICD-10-CM | POA: Diagnosis not present

## 2014-10-16 DIAGNOSIS — Y9389 Activity, other specified: Secondary | ICD-10-CM | POA: Insufficient documentation

## 2014-10-16 DIAGNOSIS — S0993XA Unspecified injury of face, initial encounter: Secondary | ICD-10-CM | POA: Diagnosis not present

## 2014-10-16 DIAGNOSIS — N183 Chronic kidney disease, stage 3 (moderate): Secondary | ICD-10-CM | POA: Insufficient documentation

## 2014-10-16 DIAGNOSIS — S0083XA Contusion of other part of head, initial encounter: Secondary | ICD-10-CM

## 2014-10-16 DIAGNOSIS — E119 Type 2 diabetes mellitus without complications: Secondary | ICD-10-CM | POA: Diagnosis not present

## 2014-10-16 DIAGNOSIS — S0990XA Unspecified injury of head, initial encounter: Secondary | ICD-10-CM | POA: Diagnosis not present

## 2014-10-16 DIAGNOSIS — S199XXA Unspecified injury of neck, initial encounter: Secondary | ICD-10-CM | POA: Insufficient documentation

## 2014-10-16 DIAGNOSIS — Y998 Other external cause status: Secondary | ICD-10-CM | POA: Diagnosis not present

## 2014-10-16 DIAGNOSIS — Z794 Long term (current) use of insulin: Secondary | ICD-10-CM | POA: Diagnosis not present

## 2014-10-16 DIAGNOSIS — Z79899 Other long term (current) drug therapy: Secondary | ICD-10-CM | POA: Insufficient documentation

## 2014-10-16 DIAGNOSIS — Z7982 Long term (current) use of aspirin: Secondary | ICD-10-CM | POA: Diagnosis not present

## 2014-10-16 DIAGNOSIS — S0991XA Unspecified injury of ear, initial encounter: Secondary | ICD-10-CM | POA: Diagnosis present

## 2014-10-16 DIAGNOSIS — S01312A Laceration without foreign body of left ear, initial encounter: Secondary | ICD-10-CM | POA: Insufficient documentation

## 2014-10-16 DIAGNOSIS — Y92121 Bathroom in nursing home as the place of occurrence of the external cause: Secondary | ICD-10-CM | POA: Insufficient documentation

## 2014-10-16 DIAGNOSIS — W19XXXA Unspecified fall, initial encounter: Secondary | ICD-10-CM | POA: Diagnosis not present

## 2014-10-16 DIAGNOSIS — R51 Headache: Secondary | ICD-10-CM | POA: Diagnosis not present

## 2014-10-16 DIAGNOSIS — R220 Localized swelling, mass and lump, head: Secondary | ICD-10-CM | POA: Diagnosis not present

## 2014-10-16 NOTE — ED Notes (Signed)
Patient transported to CT 

## 2014-10-16 NOTE — ED Notes (Signed)
Pt had a mechanical fall out of bed this am. Pt denies LOC. Pt has a large hematoma on the left side of his face in front of his ear. Pt denies any other injuries.

## 2014-10-16 NOTE — Discharge Instructions (Signed)
Your CT scan showed no intracranial injury or fracture. There is a large bruise on the left side of the face which appears to involve inflammation of the parotid gland (traumatic parotitis). You may use anti-inflammatory such as ibuprofen 600 mg 3 times per day as needed for pain and swelling. You may use an ice pack 15 minutes at a time several times per day for the first 24 hours to help decrease swelling. Return to the emergency department for any new or worsening pain, swelling, or any trouble swallowing.  Facial or Scalp Contusion A facial or scalp contusion is a deep bruise on the face or head. Injuries to the face and head generally cause a lot of swelling, especially around the eyes. Contusions are the result of an injury that caused bleeding under the skin. The contusion may turn blue, purple, or yellow. Minor injuries will give you a painless contusion, but more severe contusions may stay painful and swollen for a few weeks.  CAUSES  A facial or scalp contusion is caused by a blunt injury or trauma to the face or head area.  SIGNS AND SYMPTOMS   Swelling of the injured area.   Discoloration of the injured area.   Tenderness, soreness, or pain in the injured area.  DIAGNOSIS  The diagnosis can be made by taking a medical history and doing a physical exam. An X-ray exam, CT scan, or MRI may be needed to determine if there are any associated injuries, such as broken bones (fractures). TREATMENT  Often, the best treatment for a facial or scalp contusion is applying cold compresses to the injured area. Over-the-counter medicines may also be recommended for pain control.  HOME CARE INSTRUCTIONS   Only take over-the-counter or prescription medicines as directed by your health care provider.   Apply ice to the injured area.   Put ice in a plastic bag.   Place a towel between your skin and the bag.   Leave the ice on for 20 minutes, 2-3 times a day.  SEEK MEDICAL CARE IF:  You  have bite problems.   You have pain with chewing.   You are concerned about facial defects. SEEK IMMEDIATE MEDICAL CARE IF:  You have severe pain or a headache that is not relieved by medicine.   You have unusual sleepiness, confusion, or personality changes.   You throw up (vomit).   You have a persistent nosebleed.   You have double vision or blurred vision.   You have fluid drainage from your nose or ear.   You have difficulty walking or using your arms or legs.  MAKE SURE YOU:   Understand these instructions.  Will watch your condition.  Will get help right away if you are not doing well or get worse. Document Released: 05/30/2004 Document Revised: 02/10/2013 Document Reviewed: 12/03/2012 [facility] Patient Information 2015 [place], [place]. This information is not intended to replace advice given to you by your health care provider. Make sure you discuss any questions you have with your health care provider.

## 2014-10-16 NOTE — ED Provider Notes (Signed)
[facility] Emergency Department Provider Note   ____________________________________________  Time seen: 7:45 AM I have reviewed the triage vital signs and the triage nursing note.  HISTORY  Chief Complaint Fall   Historian Patient  HPI Martin [name] is a 81 y.o. male who lives at a nursing home, and states he rolled and fell out of bed this morning striking the left side of his face. He has some bleeding from the left inner auricle. He is complaining of pain and swelling at the left face. He does take aspirin daily. He denies any recent illness, dizziness, loss of consciousness, chest pain, or any extremity injury. Symptoms are moderate.    Past Medical History  Diagnosis Date  . Coronary artery disease   . CHF (congestive heart failure)     ischemic cardiomyopathy- EF 25%  . Hyperlipidemia   . Diabetes mellitus without complication   . Atrial fibrillation   . BPH (benign prostatic hyperplasia)   . Peripheral vascular disease   . Chronic kidney disease     Patient Active Problem List   Diagnosis Date Noted  . Chest pain 10/01/2014  . Diabetes mellitus type 2 in obese 10/01/2014  . Atrial fibrillation 10/01/2014  . Chronic kidney disease, stage III (moderate) 10/01/2014  . Systolic congestive heart failure 10/01/2014  . Peripheral vascular disease 10/01/2014  . Coronary artery disease 10/01/2014  . UTI (lower urinary tract infection) 09/10/2014  . Fall 09/10/2014    Past Surgical History  Procedure Laterality Date  . Cardiac surgery    . Coronary artery bypass graft      Current Outpatient Rx  Name  Route  Sig  Dispense  Refill  . aspirin EC 325 MG tablet   Oral   Take 325 mg by mouth daily.         . carvedilol (COREG) 3.125 MG tablet   Oral   Take 3.125 mg by mouth 2 (two) times daily with a meal.         . Docusate Sodium 100 MG capsule   Oral   Take 100 mg by mouth 2 (two) times daily.         . furosemide (LASIX) 40  MG tablet   Oral   Take 40 mg by mouth every morning.          . furosemide (LASIX) 40 MG tablet   Oral   Take 40 mg by mouth daily as needed (in the afternoon if shortness of breath or weight gain of 2 pounds occur).         . gabapentin (NEURONTIN) 300 MG capsule   Oral   Take 300 mg by mouth 3 (three) times daily.         . insulin glargine (LANTUS) 100 UNIT/ML injection   Subcutaneous   Inject 0.2 mLs (20 Units total) into the skin daily. Patient taking differently: Inject 28 Units into the skin daily.    10 mL   11   . insulin lispro (HUMALOG) 100 UNIT/ML injection   Subcutaneous   Inject 8 Units into the skin 3 (three) times daily before meals.         . nitroGLYCERIN (NITROSTAT) 0.4 MG SL tablet   Sublingual   Place 1 tablet (0.4 mg total) under the tongue every 5 (five) minutes as needed for chest pain.   30 tablet   1   . oxyCODONE-acetaminophen (PERCOCET/ROXICET) 5-325 MG per tablet   Oral   Take 0.5 tablets  by mouth at bedtime.         Marland Kitchen oxyCODONE-acetaminophen (PERCOCET/ROXICET) 5-325 MG per tablet   Oral   Take 0.5 tablets by mouth every 6 (six) hours as needed for moderate pain or severe pain.         . polyethylene glycol (MIRALAX / GLYCOLAX) packet   Oral   Take 17 g by mouth every other day.         . pravastatin (PRAVACHOL) 20 MG tablet   Oral   Take 20 mg by mouth at bedtime.          Marland Kitchen terazosin (HYTRIN) 2 MG capsule   Oral   Take 2 mg by mouth daily.          . triazolam (HALCION) 0.25 MG tablet   Oral   Take 0.5 mg by mouth at bedtime.          Marland Kitchen glimepiride (AMARYL) 2 MG tablet   Oral   Take 2 mg by mouth daily with breakfast.         . insulin aspart (NOVOLOG) 100 UNIT/ML FlexPen   Subcutaneous   Inject 8 Units into the skin 3 (three) times daily with meals. Patient not taking: Reported on 10/16/2014   15 mL   11   . sitaGLIPtin (JANUVIA) 50 MG tablet   Oral   Take 50 mg by mouth daily.            Allergies Review of patient's allergies indicates no known allergies.  No family history on file.  Social History History  Substance Use Topics  . Smoking status: Never Smoker   . Smokeless tobacco: Not on file  . Alcohol Use: No    Review of Systems  Constitutional: Negative for fever. Eyes: Negative for visual changes. ENT: Negative for sore throat. Cardiovascular: Negative for chest pain. Respiratory: Negative for shortness of breath. Gastrointestinal: Negative for abdominal pain, vomiting and diarrhea. Genitourinary: Negative for dysuria. Musculoskeletal: Negative for back pain. Skin: Negative for rash. Neurological: Negative for headaches, focal weakness or numbness.  ____________________________________________   PHYSICAL EXAM:  VITAL SIGNS: ED Triage Vitals  Enc Vitals Group     BP 10/16/14 0554 152/99 mmHg     Pulse Rate 10/16/14 0554 60     Resp --      Temp 10/16/14 0554 97.8 F (36.6 C)     Temp src --      SpO2 10/16/14 0554 97 %     Weight --      Height --      Head Cir --      Peak Flow --      Pain Score --      Pain Loc --      Pain Edu? --      Excl. in GC? --      Constitutional: Alert and oriented. Well appearing and in no distress. Heart appearing Eyes: Conjunctivae are normal. PERRL. Normal extraocular movements. ENT   Head: Normocephalic. Left lateral face just before the ear has a large hematoma/ecchymosis which is tender to palpation. The left auricle has a tiny abrasion which is oozing.   Nose: No congestion/rhinnorhea.   Mouth/Throat: Mucous membranes are moist.   Neck: No stridor, mild left-sided lateral neck tenderness.. Cardiovascular: Normal rate, regular rhythm.  No murmurs, rubs, or gallops. Respiratory: Normal respiratory effort without tachypnea nor retractions. Breath sounds are clear and equal bilaterally. No wheezes/rales/rhonchi. Gastrointestinal: Soft. No distention, no guarding, no rebound.  Nontender  Genitourinary/rectal: Deferred Musculoskeletal: Nontender with normal range of motion in all extremities. No joint effusions.  No lower extremity tenderness nor edema. Tiny old appearing abrasion to the left tibia Neurologic:  Normal speech and language. Hard of hearing No gross focal neurologic deficits are appreciated. Skin:  Skin is warm, dry and intact. No rash noted. Psychiatric: Mood and affect are normal. Speech and behavior are normal. Patient exhibits appropriate insight and judgment.  ____________________________________________   EKG  None ____________________________________________  LABS (pertinent positives/negatives)  None  ____________________________________________  RADIOLOGY Radiologist results reviewed  CT head, CT cervical spine, CT maxillofacial:  IMPRESSION: 1. Negative for acute intracranial traumatic injury 2. Moderately severe generalized atrophy and chronic microvascular ischemic disease. 3. Marked left pre-auricular soft tissue swelling, with inflammation apparently involving the left parotid. This could represent a traumatic parotitis. 4. Negative for acute maxillofacial fracture. 5. Negative for acute cervical spine fracture __________________________________________  PROCEDURES  Procedure(s) performed: LACERATION REPAIR Performed by: Governor Rooks Authorized by: Governor Rooks Consent: Verbal consent obtained. Risks and benefits: risks, benefits and alternatives were discussed Consent given by: patient   Laceration Location: Left auricle  Laceration Length: 0.25 cm  No Foreign Bodies seen or palpated  Skin closure: Skin glue   Patient tolerance: Patient tolerated the procedure well with no immediate complications.    Critical Care performed: None  ____________________________________________   ED COURSE / ASSESSMENT AND PLAN  Pertinent labs & imaging results that were available during my care of the patient were  reviewed by me and considered in my medical decision making (see chart for details).   Patient is overall well-appearing. He only sustained injury to the head/ left-sided face. I discussed the CT results with the patient regarding large left sided hematoma with inflammation down to the level of the parotid gland consistent with traumatic parotitis. Patient will be treated with anti-inflammatory since tonight pain control and ice. Return precautions and discharge instructions were provided to the patient.   ___________________________________________   FINAL CLINICAL IMPRESSION(S) / ED DIAGNOSES   Final diagnoses:  Hematoma of face, initial encounter  Parotitis not due to mumps      Governor Rooks, MD 10/16/14 [phone]

## 2014-10-16 NOTE — ED Notes (Signed)
D/c instructions reviewed w/ pt - pt denies any further questions or concerns at present.   

## 2014-10-16 NOTE — ED Notes (Addendum)
Cleaned dried blood off patient's left ear with NS per MD request. Small laceration noted; small amount of bleeding noted at this time. Placed 2x2 in ear to contain bleeding. Patient in no obvious distress. Respirations even and unlabored. Patient's family contacted by ED tech and who stated that they are in route to hospital. Informed patient. Call bell within reach. Encouraged to call with needs. Will continue to monitor.

## 2014-10-21 DIAGNOSIS — E785 Hyperlipidemia, unspecified: Secondary | ICD-10-CM | POA: Diagnosis not present

## 2014-10-21 DIAGNOSIS — I10 Essential (primary) hypertension: Secondary | ICD-10-CM | POA: Diagnosis not present

## 2014-10-21 DIAGNOSIS — E119 Type 2 diabetes mellitus without complications: Secondary | ICD-10-CM | POA: Diagnosis not present

## 2014-10-21 DIAGNOSIS — K5901 Slow transit constipation: Secondary | ICD-10-CM | POA: Diagnosis not present

## 2014-10-21 DIAGNOSIS — L0390 Cellulitis, unspecified: Secondary | ICD-10-CM | POA: Diagnosis not present

## 2014-10-24 DIAGNOSIS — K5901 Slow transit constipation: Secondary | ICD-10-CM | POA: Diagnosis not present

## 2014-10-24 DIAGNOSIS — E785 Hyperlipidemia, unspecified: Secondary | ICD-10-CM | POA: Diagnosis not present

## 2014-10-24 DIAGNOSIS — I10 Essential (primary) hypertension: Secondary | ICD-10-CM | POA: Diagnosis not present

## 2014-10-24 DIAGNOSIS — L0390 Cellulitis, unspecified: Secondary | ICD-10-CM | POA: Diagnosis not present

## 2014-10-24 DIAGNOSIS — E119 Type 2 diabetes mellitus without complications: Secondary | ICD-10-CM | POA: Diagnosis not present

## 2014-10-26 DIAGNOSIS — I5022 Chronic systolic (congestive) heart failure: Secondary | ICD-10-CM | POA: Diagnosis not present

## 2014-10-26 DIAGNOSIS — I2510 Atherosclerotic heart disease of native coronary artery without angina pectoris: Secondary | ICD-10-CM | POA: Diagnosis not present

## 2014-10-26 DIAGNOSIS — I482 Chronic atrial fibrillation: Secondary | ICD-10-CM | POA: Diagnosis not present

## 2014-10-27 DIAGNOSIS — M2010 Hallux valgus (acquired), unspecified foot: Secondary | ICD-10-CM | POA: Diagnosis not present

## 2014-10-27 DIAGNOSIS — M79673 Pain in unspecified foot: Secondary | ICD-10-CM | POA: Diagnosis not present

## 2014-10-27 DIAGNOSIS — L600 Ingrowing nail: Secondary | ICD-10-CM | POA: Diagnosis not present

## 2014-10-27 DIAGNOSIS — B351 Tinea unguium: Secondary | ICD-10-CM | POA: Diagnosis not present

## 2014-11-02 DIAGNOSIS — I739 Peripheral vascular disease, unspecified: Secondary | ICD-10-CM | POA: Diagnosis not present

## 2014-11-02 DIAGNOSIS — R29898 Other symptoms and signs involving the musculoskeletal system: Secondary | ICD-10-CM | POA: Diagnosis not present

## 2014-11-02 DIAGNOSIS — I509 Heart failure, unspecified: Secondary | ICD-10-CM | POA: Diagnosis not present

## 2014-11-02 DIAGNOSIS — R600 Localized edema: Secondary | ICD-10-CM | POA: Diagnosis not present

## 2014-11-09 DIAGNOSIS — I5022 Chronic systolic (congestive) heart failure: Secondary | ICD-10-CM | POA: Diagnosis not present

## 2014-11-09 DIAGNOSIS — I482 Chronic atrial fibrillation: Secondary | ICD-10-CM | POA: Diagnosis not present

## 2014-11-09 DIAGNOSIS — I5033 Acute on chronic diastolic (congestive) heart failure: Secondary | ICD-10-CM | POA: Diagnosis not present

## 2014-11-09 DIAGNOSIS — I2510 Atherosclerotic heart disease of native coronary artery without angina pectoris: Secondary | ICD-10-CM | POA: Diagnosis not present

## 2014-12-02 DIAGNOSIS — R29898 Other symptoms and signs involving the musculoskeletal system: Secondary | ICD-10-CM | POA: Diagnosis not present

## 2014-12-02 DIAGNOSIS — I509 Heart failure, unspecified: Secondary | ICD-10-CM | POA: Diagnosis not present

## 2014-12-02 DIAGNOSIS — R600 Localized edema: Secondary | ICD-10-CM | POA: Diagnosis not present

## 2014-12-02 DIAGNOSIS — I739 Peripheral vascular disease, unspecified: Secondary | ICD-10-CM | POA: Diagnosis not present

## 2014-12-07 DIAGNOSIS — I482 Chronic atrial fibrillation: Secondary | ICD-10-CM | POA: Diagnosis not present

## 2014-12-07 DIAGNOSIS — R600 Localized edema: Secondary | ICD-10-CM | POA: Diagnosis not present

## 2014-12-07 DIAGNOSIS — I5022 Chronic systolic (congestive) heart failure: Secondary | ICD-10-CM | POA: Diagnosis not present

## 2014-12-07 DIAGNOSIS — R001 Bradycardia, unspecified: Secondary | ICD-10-CM | POA: Diagnosis not present

## 2014-12-10 ENCOUNTER — Encounter: Payer: Self-pay | Admitting: Emergency Medicine

## 2014-12-10 ENCOUNTER — Emergency Department: Payer: Medicare Other

## 2014-12-10 ENCOUNTER — Emergency Department
Admission: EM | Admit: 2014-12-10 | Discharge: 2014-12-10 | Disposition: A | Discharge status: Home / Self Care | Payer: Medicare Other | Attending: Emergency Medicine | Admitting: Emergency Medicine

## 2014-12-10 ENCOUNTER — Other Ambulatory Visit: Payer: Self-pay

## 2014-12-10 DIAGNOSIS — Z7901 Long term (current) use of anticoagulants: Secondary | ICD-10-CM | POA: Insufficient documentation

## 2014-12-10 DIAGNOSIS — I82411 Acute embolism and thrombosis of right femoral vein: Secondary | ICD-10-CM | POA: Insufficient documentation

## 2014-12-10 DIAGNOSIS — B356 Tinea cruris: Secondary | ICD-10-CM | POA: Diagnosis not present

## 2014-12-10 DIAGNOSIS — M549 Dorsalgia, unspecified: Secondary | ICD-10-CM | POA: Diagnosis not present

## 2014-12-10 DIAGNOSIS — I82401 Acute embolism and thrombosis of unspecified deep veins of right lower extremity: Secondary | ICD-10-CM | POA: Diagnosis not present

## 2014-12-10 DIAGNOSIS — Z7982 Long term (current) use of aspirin: Secondary | ICD-10-CM | POA: Insufficient documentation

## 2014-12-10 DIAGNOSIS — E119 Type 2 diabetes mellitus without complications: Secondary | ICD-10-CM | POA: Insufficient documentation

## 2014-12-10 DIAGNOSIS — Z79899 Other long term (current) drug therapy: Secondary | ICD-10-CM | POA: Insufficient documentation

## 2014-12-10 DIAGNOSIS — M545 Low back pain: Secondary | ICD-10-CM | POA: Diagnosis present

## 2014-12-10 HISTORY — DX: Insomnia, unspecified: G47.00

## 2014-12-10 LAB — CBC WITH DIFFERENTIAL/PLATELET
BASOS ABS: 0.1 10*3/uL (ref 0–0.1)
BASOS PCT: 1 %
EOS ABS: 0.4 10*3/uL (ref 0–0.7)
Eosinophils Relative: 4 %
HEMATOCRIT: 38.9 % — AB (ref 40.0–52.0)
Hemoglobin: 13.4 g/dL (ref 13.0–18.0)
LYMPHS ABS: 2.3 10*3/uL (ref 1.0–3.6)
Lymphocytes Relative: 24 %
MCH: 31.4 pg (ref 26.0–34.0)
MCHC: 34.4 g/dL (ref 32.0–36.0)
MCV: 91.3 fL (ref 80.0–100.0)
Monocytes Absolute: 0.7 10*3/uL (ref 0.2–1.0)
Monocytes Relative: 7 %
Neutro Abs: 6.2 10*3/uL (ref 1.4–6.5)
Neutrophils Relative %: 64 %
PLATELETS: 211 10*3/uL (ref 150–440)
RBC: 4.25 MIL/uL — ABNORMAL LOW (ref 4.40–5.90)
RDW: 13.3 % (ref 11.5–14.5)
WBC: 9.6 10*3/uL (ref 3.8–10.6)

## 2014-12-10 LAB — URINALYSIS COMPLETE WITH MICROSCOPIC (ARMC ONLY)
BILIRUBIN URINE: NEGATIVE
Bacteria, UA: NONE SEEN
Glucose, UA: NEGATIVE mg/dL
Hgb urine dipstick: NEGATIVE
Ketones, ur: NEGATIVE mg/dL
Nitrite: NEGATIVE
Protein, ur: NEGATIVE mg/dL
SPECIFIC GRAVITY, URINE: 1.01 (ref 1.005–1.030)
Squamous Epithelial / LPF: NONE SEEN
pH: 6 (ref 5.0–8.0)

## 2014-12-10 LAB — BASIC METABOLIC PANEL
Anion gap: 9 (ref 5–15)
BUN: 29 mg/dL — ABNORMAL HIGH (ref 6–20)
CALCIUM: 9 mg/dL (ref 8.9–10.3)
CO2: 29 mmol/L (ref 22–32)
Chloride: 103 mmol/L (ref 101–111)
Creatinine, Ser: 1.53 mg/dL — ABNORMAL HIGH (ref 0.61–1.24)
GFR calc Af Amer: 44 mL/min — ABNORMAL LOW (ref >60)
GFR, EST NON AFRICAN AMERICAN: 38 mL/min — AB (ref >60)
GLUCOSE: 125 mg/dL — AB (ref 65–99)
POTASSIUM: 4 mmol/L (ref 3.5–5.1)
SODIUM: 141 mmol/L (ref 135–145)

## 2014-12-10 MED ORDER — RIVAROXABAN 15 MG PO TABS
15.0000 mg | ORAL_TABLET | Freq: Once | ORAL | Status: AC
  Administered 2014-12-10: 15 mg via ORAL
  Filled 2014-12-10: qty 1

## 2014-12-10 MED ORDER — RIVAROXABAN 20 MG PO TABS: 20.0000 mg | ORAL_TABLET | Freq: Every day | ORAL | Status: DC

## 2014-12-10 MED ORDER — RIVAROXABAN 15 MG PO TABS: 15.0000 mg | ORAL_TABLET | Freq: Two times a day (BID) | ORAL | Status: DC

## 2014-12-10 MED ORDER — CLOTRIMAZOLE 1 % EX CREA: 1.0000 "application " | TOPICAL_CREAM | Freq: Two times a day (BID) | CUTANEOUS | Status: DC

## 2014-12-10 MED ORDER — RIVAROXABAN (XARELTO) VTE STARTER PACK (15 & 20 MG): ORAL_TABLET | ORAL | Status: DC

## 2014-12-10 NOTE — Discharge Instructions (Signed)
Your ultrasound shows a non-occlusive DVT in the right leg. This is the cause of your swelling and pain. Take Xarelto for blood thinning to treat this DVT to prevent worsening or complications such as blood clots in the lungs. Follow-up with your primary care doctor in 1-2 weeks for continued monitoring of your condition.  Deep Vein Thrombosis A deep vein thrombosis (DVT) is a blood clot that develops in the deep, larger veins of the leg, arm, or pelvis. These are more dangerous than clots that might form in veins near the surface of the body. A DVT can lead to serious and even life-threatening complications if the clot breaks off and travels in the bloodstream to the lungs.  A DVT can damage the valves in your leg veins so that instead of flowing upward, the blood pools in the lower leg. This is called post-thrombotic syndrome, and it can result in pain, swelling, discoloration, and sores on the leg. CAUSES Usually, several things contribute to the formation of blood clots. Contributing factors include:  The flow of blood slows down.  The inside of the vein is damaged in some way.  You have a condition that makes blood clot more easily. RISK FACTORS Some people are more likely than others to develop blood clots. Risk factors include:   Smoking.  Being overweight (obese).  Sitting or lying still for a long time. This includes long-distance travel, paralysis, or recovery from an illness or surgery. Other factors that increase risk are:   Older age, especially over 10 years of age.  Having a family history of blood clots or if you have already had a blot clot.  Having major or lengthy surgery. This is especially true for surgery on the hip, knee, or belly (abdomen). Hip surgery is particularly high risk.  Having a long, thin tube (catheter) placed inside a vein during a medical procedure.  Breaking a hip or leg.  Having cancer or cancer treatment.  Pregnancy and  childbirth.  Hormone changes make the blood clot more easily during pregnancy.  The fetus puts pressure on the veins of the pelvis.  There is a risk of injury to veins during delivery or a caesarean delivery. The risk is highest just after childbirth.  Medicines containing the male hormone estrogen. This includes birth control pills and hormone replacement therapy.  Other circulation or heart problems.  SIGNS AND SYMPTOMS When a clot forms, it can either partially or totally block the blood flow in that vein. Symptoms of a DVT can include:  Swelling of the leg or arm, especially if one side is much worse.  Warmth and redness of the leg or arm, especially if one side is much worse.  Pain in an arm or leg. If the clot is in the leg, symptoms may be more noticeable or worse when standing or walking. The symptoms of a DVT that has traveled to the lungs (pulmonary embolism, PE) usually start suddenly and include:  Shortness of breath.  Coughing.  Coughing up blood or blood-tinged mucus.  Chest pain. The chest pain is often worse with deep breaths.  Rapid heartbeat. Anyone with these symptoms should get emergency medical treatment right away. Do not wait to see if the symptoms will go away. Call your local emergency services (911 in the [place] if you have these symptoms. Do not drive yourself to the hospital. DIAGNOSIS If a DVT is suspected, your health care provider will take a full medical history and perform a physical exam. Tests  that also may be required include:  Blood tests, including studies of the clotting properties of the blood.  Ultrasound to see if you have clots in your legs or lungs.  X-rays to show the flow of blood when dye is injected into the veins (venogram).  Studies of your lungs if you have any chest symptoms. PREVENTION  Exercise the legs regularly. Take a brisk 30-minute walk every day.  Maintain a weight that is appropriate for your height.  Avoid  sitting or lying in bed for long periods of time without moving your legs.  Women, particularly those over the age of 35 years, should consider the risks and benefits of taking estrogen medicines, including birth control pills.  Do not smoke, especially if you take estrogen medicines.  Long-distance travel can increase your risk of DVT. You should exercise your legs by walking or pumping the muscles every hour.  Many of the risk factors above relate to situations that exist with hospitalization, either for illness, injury, or elective surgery. Prevention may include medical and nonmedical measures.  Your health care provider will assess you for the need for venous thromboembolism prevention when you are admitted to the hospital. If you are having surgery, your surgeon will assess you the day of or day after surgery. TREATMENT Once identified, a DVT can be treated. It can also be prevented in some circumstances. Once you have had a DVT, you may be at increased risk for a DVT in the future. The most common treatment for DVT is blood-thinning (anticoagulant) medicine, which reduces the blood's tendency to clot. Anticoagulants can stop new blood clots from forming and stop old clots from growing. They cannot dissolve existing clots. Your body does this by itself over time. Anticoagulants can be given by mouth, through an IV tube, or by injection. Your health care provider will determine the best program for you. Other medicines or treatments that may be used are:  Heparin or related medicines (low molecular weight heparin) are often the first treatment for a blood clot. They act quickly. However, they cannot be taken orally and must be given either in shot form or by IV tube.  Heparin can cause a fall in a component of blood that stops bleeding and forms blood clots (platelets). You will be monitored with blood tests to be sure this does not occur.  Warfarin is an anticoagulant that can be swallowed.  It takes a few days to start working, so usually heparin or related medicines are used in combination. Once warfarin is working, heparin is usually stopped.  Factor Xa inhibitor medicines, such as rivaroxaban and apixaban, also reduce blood clotting. These medicines are taken orally and can often be used without heparin or related medicines.  Less commonly, clot dissolving drugs (thrombolytics) are used to dissolve a DVT. They carry a high risk of bleeding, so they are used mainly in severe cases where your life or a part of your body is threatened.  Very rarely, a blood clot in the leg needs to be removed surgically.  If you are unable to take anticoagulants, your health care provider may arrange for you to have a filter placed in a main vein in your abdomen. This filter prevents clots from traveling to your lungs. HOME CARE INSTRUCTIONS  Take all medicines as directed by your health care provider.  Learn as much as you can about DVT.  Wear a medical alert bracelet or carry a medical alert card.  Ask your health care provider  how soon you can go back to normal activities. It is important to stay active to prevent blood clots. If you are on anticoagulant medicine, avoid contact sports.  It is very important to exercise. This is especially important while traveling, sitting, or standing for long periods of time. Exercise your legs by walking or by tightening and relaxing your leg muscles regularly. Take frequent walks.  You may need to wear compression stockings. These are tight elastic stockings that apply pressure to the lower legs. This pressure can help keep the blood in the legs from clotting. Taking Warfarin Warfarin is a daily medicine that is taken by mouth. Your health care provider will advise you on the length of treatment (usually 3-6 months, sometimes lifelong). If you take warfarin:  Understand how to take warfarin and foods that can affect how warfarin works in [employer] account planner.  Too much and too little warfarin are both dangerous. Too much warfarin increases the risk of bleeding. Too little warfarin continues to allow the risk for blood clots. Warfarin and Regular Blood Testing While taking warfarin, you will need to have regular blood tests to measure your blood clotting time. These blood tests usually include both the prothrombin time (PT) and international normalized ratio (INR) tests. The PT and INR results allow your health care provider to adjust your dose of warfarin. It is very important that you have your PT and INR tested as often as directed by your health care provider.  Warfarin and Your Diet Avoid major changes in your diet, or notify your health care provider before changing your diet. Arrange a visit with a registered dietitian to answer your questions. Many foods, especially foods high in vitamin K, can interfere with warfarin and affect the PT and INR results. You should eat a consistent amount of foods high in vitamin K. Foods high in vitamin K include:   Spinach, kale, broccoli, cabbage, collard and turnip greens, Brussels sprouts, peas, cauliflower, seaweed, and parsley.  Beef and pork liver.  Green tea.  Soybean oil. Warfarin with Other Medicines Many medicines can interfere with warfarin and affect the PT and INR results. You must:  Tell your health care provider about any and all medicines, vitamins, and supplements you take, including aspirin and other over-the-counter anti-inflammatory medicines. Be especially cautious with aspirin and anti-inflammatory medicines. Ask your health care provider before taking these.  Do not take or discontinue any prescribed or over-the-counter medicine except on the advice of your health care provider or pharmacist. Warfarin Side Effects Warfarin can have side effects, such as easy bruising and difficulty stopping bleeding. Ask your health care provider or pharmacist about other side effects of  warfarin. You will need to:  Hold pressure over cuts for longer than usual.  Notify your dentist and other health care providers that you are taking warfarin before you undergo any procedures where bleeding may occur. Warfarin with Alcohol and Tobacco   Drinking alcohol frequently can increase the effect of warfarin, leading to excess bleeding. It is best to avoid alcoholic drinks or to consume only very small amounts while taking warfarin. Notify your health care provider if you change your alcohol intake.   Do not use any tobacco products including cigarettes, chewing tobacco, or electronic cigarettes. If you smoke, quit. Ask your health care provider for help with quitting smoking. Alternative Medicines to Warfarin: Factor Xa Inhibitor Medicines  These blood-thinning medicines are taken by mouth, usually for several weeks or longer. It is important to take  the medicine every single day at the same time each day.  There are no regular blood tests required when using these medicines.  There are fewer food and drug interactions than with warfarin.  The side effects of this class of medicine are similar to those of warfarin, including excessive bruising or bleeding. Ask your health care provider or pharmacist about other potential side effects. SEEK MEDICAL CARE IF:  You notice a rapid heartbeat.  You feel weaker or more tired than usual.  You feel faint.  You notice increased bruising.  You feel your symptoms are not getting better in the time expected.  You believe you are having side effects of medicine. SEEK IMMEDIATE MEDICAL CARE IF:  You have chest pain.  You have trouble breathing.  You have new or increased swelling or pain in one leg.  You cough up blood.  You notice blood in vomit, in a bowel movement, or in urine. MAKE SURE YOU:  Understand these instructions.  Will watch your condition.  Will get help right away if you are not doing well or get  worse. Document Released: 04/22/2005 Document Revised: 09/06/2013 Document Reviewed: 12/28/2012 [facility] Patient Information 2015 [place], [place]. This information is not intended to replace advice given to you by your health care provider. Make sure you discuss any questions you have with your health care provider.

## 2014-12-10 NOTE — ED Notes (Signed)
Pt states low back pain, "i think i have a kidney infection that's making my back hurt". Pt with 2+ non pitting lower extremity edema from feet to mid knee. Pt states "i've gained a low of weight"the patient denies shob. States has had redness and rash to groin.

## 2014-12-10 NOTE — ED Provider Notes (Addendum)
[facility] Emergency Department Provider Note  ____________________________________________  Time seen: 7:20 PM on arrival by EMS  I have reviewed the triage vital signs and the nursing notes.   HISTORY  Chief Complaint Back Pain    HPI Martin [name] is a 81 y.o. male who complains of lower back pain today. He's worried that he might have a kidney infection. No dysuria frequency urgency or hematuria. No fevers or chills chest pain shortness of breath dizziness syncope abdominal pain nausea vomiting or diarrhea. No falls trips or injuries.   Patient also complains of lower extremity swelling and weight gain over the past month.     Past Medical History  Diagnosis Date  . Coronary artery disease   . CHF (congestive heart failure)     ischemic cardiomyopathy- EF 25%  . Hyperlipidemia   . Diabetes mellitus without complication   . Atrial fibrillation   . BPH (benign prostatic hyperplasia)   . Peripheral vascular disease   . Chronic kidney disease   . Insomnia    presbycusis  Patient Active Problem List   Diagnosis Date Noted  . Chest pain 10/01/2014  . Diabetes mellitus type 2 in obese 10/01/2014  . Atrial fibrillation 10/01/2014  . Chronic kidney disease, stage III (moderate) 10/01/2014  . Systolic congestive heart failure 10/01/2014  . Peripheral vascular disease 10/01/2014  . Coronary artery disease 10/01/2014  . UTI (lower urinary tract infection) 09/10/2014  . Fall 09/10/2014    Past Surgical History  Procedure Laterality Date  . Cardiac surgery    . Coronary artery bypass graft      Current Outpatient Rx  Name  Route  Sig  Dispense  Refill  . aspirin 325 MG tablet   Oral   Take 325 mg by mouth every morning. Take with food and 4oz of fluid.         Tery Sanfilippo Sodium 100 MG capsule   Oral   Take 100 mg by mouth 2 (two) times daily.         . furosemide (LASIX) 40 MG tablet   Oral   Take 40 mg by mouth every morning.           . furosemide (LASIX) 40 MG tablet   Oral   Take 40 mg by mouth daily as needed (in the afternoon if shortness of breath or weight gain of 2 pounds occur).         . gabapentin (NEURONTIN) 300 MG capsule   Oral   Take 300 mg by mouth 3 (three) times daily.         . insulin glargine (LANTUS) 100 UNIT/ML injection   Subcutaneous   Inject 0.2 mLs (20 Units total) into the skin daily. Patient taking differently: Inject 28 Units into the skin daily.    10 mL   11   . insulin lispro (HUMALOG) 100 UNIT/ML injection   Subcutaneous   Inject 8 Units into the skin 3 (three) times daily before meals.         . nitroGLYCERIN (NITROSTAT) 0.4 MG SL tablet   Sublingual   Place 1 tablet (0.4 mg total) under the tongue every 5 (five) minutes as needed for chest pain.   30 tablet   1   . NOVOFINE 32G X 6 MM MISC      3 (three) times daily before meals. Use as directed with Novolog Flexpen           Dispense as written.   Marland Kitchen  oxyCODONE-acetaminophen (PERCOCET/ROXICET) 5-325 MG per tablet   Oral   Take 0.5 tablets by mouth at bedtime.         Marland Kitchen oxyCODONE-acetaminophen (PERCOCET/ROXICET) 5-325 MG per tablet   Oral   Take 0.5 tablets by mouth every 6 (six) hours as needed for moderate pain or severe pain.         . polyethylene glycol (MIRALAX / GLYCOLAX) packet   Oral   Take 17 g by mouth every Monday, Wednesday, and Friday.          . pravastatin (PRAVACHOL) 20 MG tablet   Oral   Take 20 mg by mouth at bedtime.          Marland Kitchen terazosin (HYTRIN) 2 MG capsule   Oral   Take 2 mg by mouth daily.          . triazolam (HALCION) 0.25 MG tablet   Oral   Take 0.5 mg by mouth at bedtime.          . gabapentin (NEURONTIN) 300 MG capsule   Oral   Take 300 mg by mouth at bedtime.          . Rivaroxaban (XARELTO STARTER PACK) 15 & 20 MG TBPK      Take as directed on package: Start with one  tablet by mouth twice a day with food. On Day 22, switch to one   tablet once a day with food.   51 each   0     Allergies Review of patient's allergies indicates no known allergies.  History reviewed. No pertinent family history.  Social History History  Substance Use Topics  . Smoking status: Never Smoker   . Smokeless tobacco: Never Used  . Alcohol Use: No    Review of Systems  Constitutional: No fever or chills. No weight changes Eyes:No blurry vision or double vision.  ENT: No sore throat. Cardiovascular: No chest pain. Respiratory: No dyspnea or cough. Gastrointestinal: Negative for abdominal pain, vomiting and diarrhea.  No BRBPR or melena. Genitourinary: Negative for dysuria, urinary retention, bloody urine, or difficulty urinating. Musculoskeletal: Lower back pain as above. Skin: Negative for rash. Neurological: Negative for headaches, focal weakness or numbness. Psychiatric:No anxiety or depression.   Endocrine:No hot/cold intolerance, changes in energy, or sleep difficulty.  10-point ROS otherwise negative.  ____________________________________________   PHYSICAL EXAM:  VITAL SIGNS: ED Triage Vitals  Enc Vitals Group     BP 12/10/14 1922 202/100 mmHg     Pulse Rate 12/10/14 1922 83     Resp 12/10/14 1922 18     Temp 12/10/14 1922 98 F (36.7 C)     Temp Source 12/10/14 1922 Oral     SpO2 12/10/14 1922 98 %     Weight 12/10/14 1922 238 lb 8 oz (108.183 kg)     Height 12/10/14 1922 6' (1.829 m)     Head Cir --      Peak Flow --      Pain Score 12/10/14 1926 6     Pain Loc --      Pain Edu? --      Excl. in GC? --      Constitutional: Alert and oriented. Well appearing and in no distress. Eyes: No scleral icterus. No conjunctival pallor. PERRL. EOMI ENT   Head: Normocephalic and atraumatic.   Nose: No congestion/rhinnorhea. No septal hematoma   Mouth/Throat: MMM, no pharyngeal erythema. No peritonsillar mass. No uvula shift.   Neck: No stridor. No SubQ emphysema.  No  meningismus. Hematological/Lymphatic/Immunilogical: No cervical lymphadenopathy. Cardiovascular: RRR. Normal and symmetric distal pulses are present in all extremities. No murmurs, rubs, or gallops. Respiratory: Normal respiratory effort without tachypnea nor retractions. Breath sounds are clear and equal bilaterally. No wheezes/rales/rhonchi. Gastrointestinal: Soft and nontender. No distention. There is no CVA tenderness.  No rebound, rigidity, or guarding. Genitourinary: deferred Musculoskeletal: Right leg tenderness with 1+ pitting edema that is asymmetric and larger compared to the left. Neurologic:   Normal speech and language.  CN 2-10 normal. Motor grossly intact. No pronator drift.  Normal gait. No gross focal neurologic deficits are appreciated.  Skin:  Skin is warm, dry and intact. No rash noted.  No petechiae, purpura, or bullae. Psychiatric: Mood and affect are normal. Speech and behavior are normal. Patient exhibits appropriate insight and judgment.  ____________________________________________    LABS (pertinent positives/negatives) (all labs ordered are listed, but only abnormal results are displayed) Labs Reviewed  URINALYSIS COMPLETEWITH MICROSCOPIC ([facility] ONLY) - Abnormal; Notable for the following:    Color, Urine STRAW (*)    APPearance CLEAR (*)    Leukocytes, UA TRACE (*)    All other components within normal limits  BASIC METABOLIC PANEL - Abnormal; Notable for the following:    Glucose, Bld 125 (*)    BUN 29 (*)    Creatinine, Ser 1.53 (*)    GFR calc non Af Amer 38 (*)    GFR calc Af Amer 44 (*)    All other components within normal limits  CBC WITH DIFFERENTIAL/PLATELET - Abnormal; Notable for the following:    RBC 4.25 (*)    HCT 38.9 (*)    All other components within normal limits   ____________________________________________   EKG  EKG interpreted by me Atrial fibrillation rate of 61, left axis, normal intervals, normal QRS and ST segments  and T waves  ____________________________________________    RADIOLOGY  Ultrasound right lower extremity discussed with radiologist, positive for nonocclusive DVT in the common femoral and superficial femoral veins  ____________________________________________   PROCEDURES  ____________________________________________   INITIAL IMPRESSION / ASSESSMENT AND PLAN / ED COURSE  Pertinent labs & imaging results that were available during my care of the patient were reviewed by me and considered in my medical decision making (see chart for details).  Patient presents with low back pain and right leg pain which turns out to be due to a nonocclusive DVT. Family notes that he has been complaining of more leg pain over the last 4 days. He's not had any prior problems with DVT or PE. He previously tolerated anticoagulants for his non-valvular atrial fibrillation. They report that he spends most of his time in a wheelchair and uses a walker when he does ambulate and has not had any recent falls. He appears to be a low risk for falls and a good candidate for outpatient therapy with oral anticoagulant. His creatinine clearance is greater than 30. No evidence of spinal pathology or urinary tract infection. Low suspicion for intra-abdominal pathology. We'll discharge the patient home and have him follow up with primary care.  ____________________________________________   FINAL CLINICAL IMPRESSION(S) / ED DIAGNOSES  Final diagnoses:  DVT (deep venous thrombosis), right   tinea cruris of the groin    Sharman Cheek, MD 12/10/14 2231  Sharman Cheek, MD 12/10/14 2318

## 2014-12-12 DIAGNOSIS — I482 Chronic atrial fibrillation: Secondary | ICD-10-CM | POA: Diagnosis not present

## 2014-12-12 DIAGNOSIS — R001 Bradycardia, unspecified: Secondary | ICD-10-CM | POA: Diagnosis not present

## 2014-12-13 DIAGNOSIS — M1711 Unilateral primary osteoarthritis, right knee: Secondary | ICD-10-CM | POA: Diagnosis not present

## 2014-12-15 DIAGNOSIS — I2510 Atherosclerotic heart disease of native coronary artery without angina pectoris: Secondary | ICD-10-CM | POA: Diagnosis not present

## 2014-12-15 DIAGNOSIS — M179 Osteoarthritis of knee, unspecified: Secondary | ICD-10-CM | POA: Diagnosis not present

## 2014-12-15 DIAGNOSIS — R609 Edema, unspecified: Secondary | ICD-10-CM | POA: Diagnosis not present

## 2014-12-20 DIAGNOSIS — Z79899 Other long term (current) drug therapy: Secondary | ICD-10-CM | POA: Diagnosis not present

## 2014-12-22 DIAGNOSIS — R609 Edema, unspecified: Secondary | ICD-10-CM | POA: Diagnosis not present

## 2014-12-22 DIAGNOSIS — E539 Vitamin B deficiency, unspecified: Secondary | ICD-10-CM | POA: Diagnosis not present

## 2014-12-22 DIAGNOSIS — R5381 Other malaise: Secondary | ICD-10-CM | POA: Diagnosis not present

## 2014-12-22 DIAGNOSIS — I10 Essential (primary) hypertension: Secondary | ICD-10-CM | POA: Diagnosis not present

## 2015-01-02 DIAGNOSIS — I739 Peripheral vascular disease, unspecified: Secondary | ICD-10-CM | POA: Diagnosis not present

## 2015-01-02 DIAGNOSIS — R29898 Other symptoms and signs involving the musculoskeletal system: Secondary | ICD-10-CM | POA: Diagnosis not present

## 2015-01-02 DIAGNOSIS — R600 Localized edema: Secondary | ICD-10-CM | POA: Diagnosis not present

## 2015-01-02 DIAGNOSIS — I509 Heart failure, unspecified: Secondary | ICD-10-CM | POA: Diagnosis not present

## 2015-01-11 DIAGNOSIS — M79673 Pain in unspecified foot: Secondary | ICD-10-CM | POA: Diagnosis not present

## 2015-01-11 DIAGNOSIS — M2010 Hallux valgus (acquired), unspecified foot: Secondary | ICD-10-CM | POA: Diagnosis not present

## 2015-01-11 DIAGNOSIS — B351 Tinea unguium: Secondary | ICD-10-CM | POA: Diagnosis not present

## 2015-01-11 DIAGNOSIS — L600 Ingrowing nail: Secondary | ICD-10-CM | POA: Diagnosis not present

## 2015-02-02 DIAGNOSIS — I509 Heart failure, unspecified: Secondary | ICD-10-CM | POA: Diagnosis not present

## 2015-02-02 DIAGNOSIS — R600 Localized edema: Secondary | ICD-10-CM | POA: Diagnosis not present

## 2015-02-02 DIAGNOSIS — I739 Peripheral vascular disease, unspecified: Secondary | ICD-10-CM | POA: Diagnosis not present

## 2015-02-02 DIAGNOSIS — H938X2 Other specified disorders of left ear: Secondary | ICD-10-CM | POA: Diagnosis not present

## 2015-02-02 DIAGNOSIS — E119 Type 2 diabetes mellitus without complications: Secondary | ICD-10-CM | POA: Diagnosis not present

## 2015-02-02 DIAGNOSIS — R21 Rash and other nonspecific skin eruption: Secondary | ICD-10-CM | POA: Diagnosis not present

## 2015-02-02 DIAGNOSIS — R29898 Other symptoms and signs involving the musculoskeletal system: Secondary | ICD-10-CM | POA: Diagnosis not present

## 2015-02-08 DIAGNOSIS — I482 Chronic atrial fibrillation: Secondary | ICD-10-CM | POA: Diagnosis not present

## 2015-02-08 DIAGNOSIS — M79661 Pain in right lower leg: Secondary | ICD-10-CM | POA: Diagnosis not present

## 2015-02-08 DIAGNOSIS — I5022 Chronic systolic (congestive) heart failure: Secondary | ICD-10-CM | POA: Diagnosis not present

## 2015-02-08 DIAGNOSIS — R600 Localized edema: Secondary | ICD-10-CM | POA: Diagnosis not present

## 2015-02-16 DIAGNOSIS — I10 Essential (primary) hypertension: Secondary | ICD-10-CM | POA: Diagnosis not present

## 2015-02-16 DIAGNOSIS — M179 Osteoarthritis of knee, unspecified: Secondary | ICD-10-CM | POA: Diagnosis not present

## 2015-02-16 DIAGNOSIS — I2510 Atherosclerotic heart disease of native coronary artery without angina pectoris: Secondary | ICD-10-CM | POA: Diagnosis not present

## 2015-02-16 DIAGNOSIS — N189 Chronic kidney disease, unspecified: Secondary | ICD-10-CM | POA: Diagnosis not present

## 2015-02-20 DIAGNOSIS — H903 Sensorineural hearing loss, bilateral: Secondary | ICD-10-CM | POA: Diagnosis not present

## 2015-02-20 DIAGNOSIS — H6123 Impacted cerumen, bilateral: Secondary | ICD-10-CM | POA: Diagnosis not present

## 2015-03-04 DIAGNOSIS — R29898 Other symptoms and signs involving the musculoskeletal system: Secondary | ICD-10-CM | POA: Diagnosis not present

## 2015-03-04 DIAGNOSIS — I509 Heart failure, unspecified: Secondary | ICD-10-CM | POA: Diagnosis not present

## 2015-03-04 DIAGNOSIS — R600 Localized edema: Secondary | ICD-10-CM | POA: Diagnosis not present

## 2015-03-04 DIAGNOSIS — I739 Peripheral vascular disease, unspecified: Secondary | ICD-10-CM | POA: Diagnosis not present

## 2015-03-08 ENCOUNTER — Emergency Department: Payer: Medicare Other

## 2015-03-08 ENCOUNTER — Emergency Department
Admission: EM | Admit: 2015-03-08 | Discharge: 2015-03-08 | Disposition: A | Discharge status: Home / Self Care | Payer: Medicare Other | Attending: Internal Medicine | Admitting: Internal Medicine

## 2015-03-08 DIAGNOSIS — R0602 Shortness of breath: Secondary | ICD-10-CM | POA: Diagnosis not present

## 2015-03-08 DIAGNOSIS — B309 Viral conjunctivitis, unspecified: Secondary | ICD-10-CM | POA: Diagnosis present

## 2015-03-08 DIAGNOSIS — J189 Pneumonia, unspecified organism: Secondary | ICD-10-CM | POA: Diagnosis present

## 2015-03-08 DIAGNOSIS — E785 Hyperlipidemia, unspecified: Secondary | ICD-10-CM | POA: Diagnosis present

## 2015-03-08 DIAGNOSIS — Z79899 Other long term (current) drug therapy: Secondary | ICD-10-CM | POA: Insufficient documentation

## 2015-03-08 DIAGNOSIS — Z23 Encounter for immunization: Secondary | ICD-10-CM

## 2015-03-08 DIAGNOSIS — I5023 Acute on chronic systolic (congestive) heart failure: Secondary | ICD-10-CM | POA: Diagnosis present

## 2015-03-08 DIAGNOSIS — N183 Chronic kidney disease, stage 3 (moderate): Secondary | ICD-10-CM | POA: Diagnosis present

## 2015-03-08 DIAGNOSIS — Z794 Long term (current) use of insulin: Secondary | ICD-10-CM

## 2015-03-08 DIAGNOSIS — A419 Sepsis, unspecified organism: Principal | ICD-10-CM | POA: Diagnosis present

## 2015-03-08 DIAGNOSIS — Z79891 Long term (current) use of opiate analgesic: Secondary | ICD-10-CM

## 2015-03-08 DIAGNOSIS — I255 Ischemic cardiomyopathy: Secondary | ICD-10-CM | POA: Diagnosis present

## 2015-03-08 DIAGNOSIS — Z7901 Long term (current) use of anticoagulants: Secondary | ICD-10-CM | POA: Insufficient documentation

## 2015-03-08 DIAGNOSIS — R079 Chest pain, unspecified: Secondary | ICD-10-CM | POA: Insufficient documentation

## 2015-03-08 DIAGNOSIS — H9190 Unspecified hearing loss, unspecified ear: Secondary | ICD-10-CM | POA: Diagnosis present

## 2015-03-08 DIAGNOSIS — Z7982 Long term (current) use of aspirin: Secondary | ICD-10-CM

## 2015-03-08 DIAGNOSIS — I509 Heart failure, unspecified: Secondary | ICD-10-CM | POA: Insufficient documentation

## 2015-03-08 DIAGNOSIS — Z951 Presence of aortocoronary bypass graft: Secondary | ICD-10-CM

## 2015-03-08 DIAGNOSIS — I739 Peripheral vascular disease, unspecified: Secondary | ICD-10-CM | POA: Diagnosis present

## 2015-03-08 DIAGNOSIS — E119 Type 2 diabetes mellitus without complications: Secondary | ICD-10-CM | POA: Diagnosis present

## 2015-03-08 DIAGNOSIS — I4891 Unspecified atrial fibrillation: Secondary | ICD-10-CM | POA: Diagnosis present

## 2015-03-08 DIAGNOSIS — I2510 Atherosclerotic heart disease of native coronary artery without angina pectoris: Secondary | ICD-10-CM | POA: Diagnosis present

## 2015-03-08 DIAGNOSIS — N400 Enlarged prostate without lower urinary tract symptoms: Secondary | ICD-10-CM | POA: Diagnosis present

## 2015-03-08 LAB — TROPONIN I
TROPONIN I: 0.05 ng/mL — AB (ref <0.031)
TROPONIN I: 0.06 ng/mL — AB (ref <0.031)

## 2015-03-08 LAB — BASIC METABOLIC PANEL
ANION GAP: 9 (ref 5–15)
BUN: 25 mg/dL — ABNORMAL HIGH (ref 6–20)
CALCIUM: 8.9 mg/dL (ref 8.9–10.3)
CO2: 26 mmol/L (ref 22–32)
Chloride: 104 mmol/L (ref 101–111)
Creatinine, Ser: 1.63 mg/dL — ABNORMAL HIGH (ref 0.61–1.24)
GFR, EST AFRICAN AMERICAN: 41 mL/min — AB (ref >60)
GFR, EST NON AFRICAN AMERICAN: 35 mL/min — AB (ref >60)
GLUCOSE: 191 mg/dL — AB (ref 65–99)
Potassium: 3.9 mmol/L (ref 3.5–5.1)
SODIUM: 139 mmol/L (ref 135–145)

## 2015-03-08 LAB — URINALYSIS COMPLETE WITH MICROSCOPIC (ARMC ONLY)
BACTERIA UA: NONE SEEN
Bilirubin Urine: NEGATIVE
GLUCOSE, UA: 150 mg/dL — AB
Hgb urine dipstick: NEGATIVE
Ketones, ur: NEGATIVE mg/dL
Leukocytes, UA: NEGATIVE
Nitrite: NEGATIVE
Protein, ur: NEGATIVE mg/dL
SPECIFIC GRAVITY, URINE: 1.016 (ref 1.005–1.030)
pH: 7 (ref 5.0–8.0)

## 2015-03-08 LAB — CBC
HCT: 37.9 % — ABNORMAL LOW (ref 40.0–52.0)
HEMOGLOBIN: 12.6 g/dL — AB (ref 13.0–18.0)
MCH: 30.6 pg (ref 26.0–34.0)
MCHC: 33.2 g/dL (ref 32.0–36.0)
MCV: 92.2 fL (ref 80.0–100.0)
Platelets: 210 10*3/uL (ref 150–440)
RBC: 4.11 MIL/uL — AB (ref 4.40–5.90)
RDW: 12.7 % (ref 11.5–14.5)
WBC: 18.2 10*3/uL — AB (ref 3.8–10.6)

## 2015-03-08 LAB — PROTIME-INR
INR: 1.22
PROTHROMBIN TIME: 15.6 s — AB (ref 11.4–15.0)

## 2015-03-08 LAB — BRAIN NATRIURETIC PEPTIDE: B Natriuretic Peptide: 275 pg/mL — ABNORMAL HIGH (ref 0.0–100.0)

## 2015-03-08 MED ORDER — FUROSEMIDE 10 MG/ML IJ SOLN
20.0000 mg | Freq: Once | INTRAMUSCULAR | Status: AC
  Administered 2015-03-08: 20 mg via INTRAVENOUS
  Filled 2015-03-08: qty 4

## 2015-03-08 NOTE — ED Provider Notes (Addendum)
[facility] Emergency Department Provider Note  ____________________________________________   I have reviewed the triage vital signs and the nursing notes.   HISTORY  Chief Complaint Chest Pain    HPI Martin [name] is a 81 y.o. male  . Coronary artery disease   . CHF (congestive heart failure)     ischemic cardiomyopathy- EF 25%  . Hyperlipidemia   . Diabetes mellitus without complication   . Atrial fibrillation   . BPH (benign prostatic hyperplasia)   . Peripheral vascular disease   . Chronic kidney disease        Presents today with left-sided intermittent chest pain with occasional shortness of breath. Denies fever or chills. The patient has no shortness of breath at this time. He is a very poor historian. He also states that his chronic hip arthritis is acting up. He states the pain started this morning. Denies fever or chills or pleuritic chest pain. Is on the left side. He states he does get this sometimes.  Past Medical History  Diagnosis Date  . Coronary artery disease   . CHF (congestive heart failure)     ischemic cardiomyopathy- EF 25%  . Hyperlipidemia   . Diabetes mellitus without complication   . Atrial fibrillation   . BPH (benign prostatic hyperplasia)   . Peripheral vascular disease   . Chronic kidney disease   . Insomnia     Patient Active Problem List   Diagnosis Date Noted  . Chest pain 10/01/2014  . Diabetes mellitus type 2 in obese (HCC) 10/01/2014  . Atrial fibrillation (HCC) 10/01/2014  . Chronic kidney disease, stage III (moderate) 10/01/2014  . Systolic congestive heart failure (HCC) 10/01/2014  . Peripheral vascular disease (HCC) 10/01/2014  . Coronary artery disease 10/01/2014  . UTI (lower urinary tract infection) 09/10/2014  . Fall 09/10/2014    Past Surgical History  Procedure Laterality Date  . Cardiac surgery    . Coronary artery bypass graft      Current Outpatient Rx   Name  Route  Sig  Dispense  Refill  . aspirin 325 MG tablet   Oral   Take 325 mg by mouth every morning. Take with food and 4oz of fluid.         Tery Sanfilippo. Docusate Sodium 100 MG capsule   Oral   Take 100 mg by mouth 2 (two) times daily.         . furosemide (LASIX) 40 MG tablet   Oral   Take 40 mg by mouth every morning.          . furosemide (LASIX) 40 MG tablet   Oral   Take 40 mg by mouth daily as needed (in the afternoon if shortness of breath or weight gain of 2 pounds occur).         . gabapentin (NEURONTIN) 100 MG capsule   Oral   Take 100 mg by mouth 3 (three) times daily.         . insulin glargine (LANTUS) 100 UNIT/ML injection   Subcutaneous   Inject 0.2 mLs (20 Units total) into the skin daily. Patient taking differently: Inject 28 Units into the skin daily.    10 mL   11   . insulin lispro (HUMALOG) 100 UNIT/ML injection   Subcutaneous   Inject 8 Units into the skin 3 (three) times daily before meals.         . nitroGLYCERIN (NITROSTAT) 0.4 MG SL tablet   Sublingual   Place  1 tablet (0.4 mg total) under the tongue every 5 (five) minutes as needed for chest pain.   30 tablet   1   . oxyCODONE-acetaminophen (PERCOCET/ROXICET) 5-325 MG per tablet   Oral   Take 0.5 tablets by mouth at bedtime.         Marland Kitchen oxyCODONE-acetaminophen (PERCOCET/ROXICET) 5-325 MG per tablet   Oral   Take 0.5 tablets by mouth every 6 (six) hours as needed for moderate pain or severe pain.         . polyethylene glycol (MIRALAX / GLYCOLAX) packet   Oral   Take 17 g by mouth every Monday, Wednesday, and Friday.          . pravastatin (PRAVACHOL) 20 MG tablet   Oral   Take 20 mg by mouth at bedtime.          . rivaroxaban (XARELTO) 20 MG TABS tablet   Oral   Take 20 mg by mouth daily.         Marland Kitchen terazosin (HYTRIN) 2 MG capsule   Oral   Take 2 mg by mouth daily.          . triazolam (HALCION) 0.25 MG tablet   Oral   Take 0.5 mg by mouth at bedtime.           . vitamin B-12 (CYANOCOBALAMIN) 500 MCG tablet   Oral   Take 500 mcg by mouth daily.           Allergies Review of patient's allergies indicates no known allergies.  No family history on file.  Social History Social History  Substance Use Topics  . Smoking status: Never Smoker   . Smokeless tobacco: Never Used  . Alcohol Use: No    Review of Systems Constitutional: No fever/chills Eyes: No visual changes. ENT: No sore throat. No stiff neck no neck pain Cardiovascular: chest pain. Respiratory: Denies shortness of breath. Gastrointestinal:   no vomiting.  No diarrhea.  No constipation. Genitourinary: Negative for dysuria. Musculoskeletal: Negative lower extremity swelling Skin: Negative for rash. Neurological: Negative for headaches, focal weakness or numbness. 10-point ROS otherwise negative.  ____________________________________________   PHYSICAL EXAM:  VITAL SIGNS: ED Triage Vitals  Enc Vitals Group     BP 03/08/15 1030 137/72 mmHg     Pulse Rate 03/08/15 0905 83     Resp 03/08/15 0905 38     Temp 03/08/15 0905 98.1 F (36.7 C)     Temp Source 03/08/15 0905 Oral     SpO2 03/08/15 0905 94 %     Weight 03/08/15 0905 240 lb (108.863 kg)     Height 03/08/15 0905 6' (1.829 m)     Head Cir --      Peak Flow --      Pain Score 03/08/15 1200 4     Pain Loc --      Pain Edu? --      Excl. in GC? --     Constitutional: Alert and oriented. Well appearing and in no acute distress. Eyes: Conjunctivae are normal. PERRL. EOMI. Head: Atraumatic. Nose: No congestion/rhinnorhea. Mouth/Throat: Mucous membranes are moist.  Oropharynx non-erythematous. Neck: No stridor.   Nontender with no meningismus Cardiovascular: Normal rate, regular rhythm. Grossly normal heart sounds.  Good peripheral circulation. Chest: Tender to palpation left chest wall which reproduces the patient's pain Respiratory: Normal respiratory effort.  No retractions. Lungs CTAB. Abdominal:  Soft and nontender. No distention. No guarding no rebound Back:  There is no focal  tenderness or step off there is no midline tenderness there are no lesions noted. there is no CVA tenderness Musculoskeletal: No lower extremity tenderness. No joint effusions, no DVT signs strong distal pulses no edema Neurologic:  Normal speech and language. No gross focal neurologic deficits are appreciated.  Skin:  Skin is warm, dry and intact. No rash noted. Psychiatric: Mood and affect are normal. Speech and behavior are normal.  ____________________________________________   LABS (all labs ordered are listed, but only abnormal results are displayed)  Labs Reviewed  BASIC METABOLIC PANEL - Abnormal; Notable for the following:    Glucose, Bld 191 (*)    BUN 25 (*)    Creatinine, Ser 1.63 (*)    GFR calc non Af Amer 35 (*)    GFR calc Af Amer 41 (*)    All other components within normal limits  CBC - Abnormal; Notable for the following:    WBC 18.2 (*)    RBC 4.11 (*)    Hemoglobin 12.6 (*)    HCT 37.9 (*)    All other components within normal limits  TROPONIN I - Abnormal; Notable for the following:    Troponin I 0.05 (*)    All other components within normal limits  PROTIME-INR - Abnormal; Notable for the following:    Prothrombin Time 15.6 (*)    All other components within normal limits  BRAIN NATRIURETIC PEPTIDE - Abnormal; Notable for the following:    B Natriuretic Peptide 275.0 (*)    All other components within normal limits  URINALYSIS COMPLETEWITH MICROSCOPIC ([facility] ONLY) - Abnormal; Notable for the following:    Color, Urine YELLOW (*)    APPearance CLEAR (*)    Glucose, UA 150 (*)    Squamous Epithelial / LPF 0-5 (*)    All other components within normal limits   ____________________________________________  EKG  I personally interpreted the EKG, left bundle branch block, no acute ST elevation or depression, likely atrial  fibrillation. ____________________________________________  RADIOLOGY  I personally reviewed films ____________________________________________   PROCEDURES  Procedure(s) performed: None  Critical Care performed: None  ____________________________________________   INITIAL IMPRESSION / ASSESSMENT AND PLAN / ED COURSE  Pertinent labs & imaging results that were available during my care of the patient were reviewed by me and considered in my medical decision making (see chart for details).  Patient here complaining of arthritis in his hips with no evidence of fracture or actual tenderness on exam at this time as well as left-sided chest wall pain. He has been admitted for this chest wall pain in the port's. Patient does have a history of DVT and is on anticoagulation medication. Very atypical story for ACS and troponin is negative. The onlytime he has pain is when I touch his chest wall. I have low suspicion for a PE for this discomfort however patient is already anticoagulated, and his creatinine would likely not well sustain a dye load.  I certainly do not wish to damage the patient's kidneys any further looking for a blood clot when he is already anticoagulated. There is no evidence of acute CHF flare patient has no complaints at this time unless I touch his chest. I discussed with the hospitalist and they requested that I talk to his cardiologist to see if we can get him home with 2 sets. ____________________________________________  ----------------------------------------- 2:45 PM on 03/08/2015 -----------------------------------------  Pt w no cp no sob and no complaints. Eager to go home. Troponin noted. Will call his cardiologist.  -----------------------------------------  3:20 PM on 03/08/2015 -----------------------------------------  D/w doctor Gwen Pounds, patient's primary cardiologist. He knows the patient well and does not feel the patient requires admission. Also  discussed with the hospitalist and they would prefer to send him home. Given that the patient has no ongoing chest pain, no significant change in his troponin, we will have him follow closely with his cardiologist tomorrow. Cardiology does not feel that a CT scan would be of any great use to the patient given the fact that he is already anticoagulated. They do not think he is a good candidate for an IVC filter, and they do not feel that he is a good candidate for an cardiac catheterization. Given all this, the patient's absence of symptoms we will discharge him home. He does have some slight tachypnea now and again although usually when I am in the room he is not laboring to breathe, however, again, his GFR is barely eligible for a CT scan and there is no change in management likely to be performed from further imaging.  ----------------------------------------- 3:46 PM on 03/08/2015 -----------------------------------------  Patient's respiratory rate is still in the mid 20s usually and when he moves around the bed he goes out. I'm concerned that this is not a safe thing for him to go home with. We will likely have to observe him unfortunately.  FINAL CLINICAL IMPRESSION(S) / ED DIAGNOSES  Final diagnoses:  None     Jeanmarie Plant, MD 03/08/15 1315  Jeanmarie Plant, MD 03/08/15 1446  Jeanmarie Plant, MD 03/08/15 1523  Jeanmarie Plant, MD 03/08/15 [phone]

## 2015-03-08 NOTE — Consult Note (Signed)
[facility] - [place] at [facility]   PATIENT NAME: Martin [name]    MR#:  [id number]  DATE OF BIRTH:  06/02/1921  DATE OF ADMISSION:  03/08/2015  PRIMARY CARE PHYSICIAN: Dr Sherrie Mustache  REQUESTING/REFERRING PHYSICIAN:Dr [email or web address]  CHIEF COMPLAINT:   Chief Complaint  Patient presents with  . Chest Pain    81 yom PMHx CAD (s/p CABG), CHF, Afib (on Xarelto) CKD presents via EMS from Assisted Living facility with left sided chest pressure, intermittently. Onset this morning. Increase in SOB, and shakiness. No fevers/chills. No productive cough.     HISTORY OF PRESENT ILLNESS:  Martin [name]  is a 81 y.o. male with a known history of coronary artery disease status post CABG his, A. fib, CHF presents to the emergency room with aches and pains and his hip. Patient also noted some chest pain earlier in the morning. During my evaluation patient states he feels 100% and is ready to go back to his facility and visit Dr. Gwen Pounds tomorrow in the morning at 11:00. Patient was seen by ER physician and internal medicine was consulted since patient's respiratory rate was in the 30s. Talking to the RN patient's respiratory rate went up while they were trying to get him to bedside commode. This was not relayed to the ER physician and I was asked for patient to be admitted for possible congestive heart failure.   during my evaluation patient's pulse rate was 75 his respiratory rate was 21 and his blood pressure was 135/75.   he was chest pain-free   PAST MEDICAL HISTORY:   Past Medical History  Diagnosis Date  . Coronary artery disease   . CHF (congestive heart failure)     ischemic cardiomyopathy- EF 25%  . Hyperlipidemia   . Diabetes mellitus without complication   . Atrial fibrillation   . BPH (benign prostatic hyperplasia)   . Peripheral vascular disease   . Chronic kidney disease   . Insomnia     PAST SURGICAL HISTOIRY:   Past Surgical History  Procedure Laterality  Date  . Cardiac surgery    . Coronary artery bypass graft      SOCIAL HISTORY:   Social History  Substance Use Topics  . Smoking status: Never Smoker   . Smokeless tobacco: Never Used  . Alcohol Use: No    FAMILY HISTORY:  No family history on file.  DRUG ALLERGIES:  No Known Allergies  REVIEW OF SYSTEMS:   Review of Systems  Constitutional: Negative for fever, chills and weight loss.  HENT: Negative for ear discharge, ear pain and nosebleeds.   Eyes: Negative for blurred vision, pain and discharge.  Respiratory: Negative for sputum production, shortness of breath, wheezing and stridor.   Cardiovascular: Negative for chest pain, palpitations, orthopnea and PND.  Gastrointestinal: Negative for nausea, vomiting, abdominal pain and diarrhea.  Genitourinary: Negative for urgency and frequency.  Musculoskeletal: Positive for back pain and joint pain.  Neurological: Negative for sensory change, speech change, focal weakness and weakness.  Psychiatric/Behavioral: Negative for depression and hallucinations. The patient is not nervous/anxious.   All other systems reviewed and are negative.  MEDICATIONS AT HOME:   Prior to Admission medications   Medication Sig Start Date End Date Taking? Authorizing Provider  aspirin 325 MG tablet Take 325 mg by mouth every morning. Take with food and 4oz of fluid.   Yes Historical Provider, MD  Docusate Sodium 100 MG capsule Take 100 mg by mouth 2 (two) times daily.  Yes Historical Provider, MD  furosemide (LASIX) 40 MG tablet Take 40 mg by mouth every morning.    Yes Historical Provider, MD  furosemide (LASIX) 40 MG tablet Take 40 mg by mouth daily as needed (in the afternoon if shortness of breath or weight gain of 2 pounds occur).   Yes Historical Provider, MD  gabapentin (NEURONTIN) 100 MG capsule Take 100 mg by mouth 3 (three) times daily.   Yes Historical Provider, MD  insulin glargine (LANTUS) 100 UNIT/ML injection Inject 0.2 mLs (20 Units  total) into the skin daily. Patient taking differently: Inject 28 Units into the skin daily.  09/12/14  Yes Houston SirenVivek J Sainani, MD  insulin lispro (HUMALOG) 100 UNIT/ML injection Inject 8 Units into the skin 3 (three) times daily before meals.   Yes Historical Provider, MD  nitroGLYCERIN (NITROSTAT) 0.4 MG SL tablet Place 1 tablet (0.4 mg total) under the tongue every 5 (five) minutes as needed for chest pain. 09/12/14  Yes Houston SirenVivek J Sainani, MD  oxyCODONE-acetaminophen (PERCOCET/ROXICET) 5-325 MG per tablet Take 0.5 tablets by mouth at bedtime.   Yes Historical Provider, MD  oxyCODONE-acetaminophen (PERCOCET/ROXICET) 5-325 MG per tablet Take 0.5 tablets by mouth every 6 (six) hours as needed for moderate pain or severe pain.   Yes Historical Provider, MD  polyethylene glycol (MIRALAX / GLYCOLAX) packet Take 17 g by mouth every Monday, Wednesday, and Friday.    Yes Historical Provider, MD  pravastatin (PRAVACHOL) 20 MG tablet Take 20 mg by mouth at bedtime.    Yes Historical Provider, MD  rivaroxaban (XARELTO) 20 MG TABS tablet Take 20 mg by mouth daily.   Yes Historical Provider, MD  terazosin (HYTRIN) 2 MG capsule Take 2 mg by mouth daily.    Yes Historical Provider, MD  triazolam (HALCION) 0.25 MG tablet Take 0.5 mg by mouth at bedtime.    Yes Historical Provider, MD  vitamin B-12 (CYANOCOBALAMIN) 500 MCG tablet Take 500 mcg by mouth daily.   Yes Historical Provider, MD      VITAL SIGNS:  Blood pressure 130/61, pulse 70, temperature 99.7 F (37.6 C), temperature source Oral, resp. rate 21, height 6' (1.829 m), weight 108.863 kg (240 lb), SpO2 97 %.  PHYSICAL EXAMINATION:  GENERAL:  [age] y.o.-year-old patient lying in the bed with no acute distress.  EYES: Pupils equal, round, reactive to light and accommodation. No scleral icterus. Extraocular muscles intact.  HEENT: Head atraumatic, normocephalic. Oropharynx and nasopharynx clear.  NECK:  Supple, no jugular venous distention. No thyroid enlargement,  no tenderness.  LUNGS: Normal breath sounds bilaterally, no wheezing, rales,rhonchi or crepitation. No use of accessory muscles of respiration.  CARDIOVASCULAR: S1, S2 normal. No murmurs, rubs, or gallops.  ABDOMEN: Soft, nontender, nondistended. Bowel sounds present. No organomegaly or mass.  obesity EXTREMITIES: No pedal edema, cyanosis, or clubbing.  NEUROLOGIC: Cranial nerves II through XII are intact. Muscle strength 5/5 in all extremities. Sensation intact. Gait not checked.  PSYCHIATRIC: The patient is alert and oriented x 3.  SKIN: No obvious rash, lesion, or ulcer.   LABORATORY PANEL:   CBC  Recent Labs Lab 03/08/15 0915  WBC 18.2*  HGB 12.6*  HCT 37.9*  PLT 210   ------------------------------------------------------------------------------------------------------------------  Chemistries   Recent Labs Lab 03/08/15 0915  NA 139  K 3.9  CL 104  CO2 26  GLUCOSE 191*  BUN 25*  CREATININE 1.63*  CALCIUM 8.9   ------------------------------------------------------------------------------------------------------------------  Cardiac Enzymes  Recent Labs Lab 03/08/15 1331  TROPONINI 0.06*   ------------------------------------------------------------------------------------------------------------------  RADIOLOGY:  Dg Chest 2 View  03/08/2015  CLINICAL DATA:  Left-sided chest pain and shortness of breath, onset this morning, history of atrial fibrillation and CHF and CABG EXAM: CHEST  2 VIEW COMPARISON:  Portable chest x-ray of Oct 01, 2014 FINDINGS: The lungs are well-expanded. There is a trace of pleural fluid on the right blunting the costophrenic angles. The cardiac silhouette is enlarged. The pulmonary vascularity is engorged. The pulmonary interstitial markings are mildly increased. There are post CABG changes. The bony thorax exhibits no acute abnormalities. IMPRESSION: CHF with mild pulmonary interstitial edema and small right pleural effusion.  Electronically Signed   By: David  [place] M.D.   On: 03/08/2015 10:16   Dg Hip Unilat With Pelvis 2-3 Views Right  03/08/2015  CLINICAL DATA:  Bilateral hip pain, no injury EXAM: DG HIP (WITH OR WITHOUT PELVIS) 2-3V RIGHT COMPARISON:  None. FINDINGS: There is significant degenerative joint disease involving the right hip with significant loss of joint space, sclerosis, and spurring. Lesser degenerative change is noted involving the left hip. No fracture is seen. The pelvic rami are intact. The SI joints are corticated. A vascular stent overlies the right pelvis. IMPRESSION: Degenerative joint disease of the hips, right much greater than left Electronically Signed   By: Dwyane Dee M.D.   On: 03/08/2015 10:18    EKG:   Atrial fibrillation, PVC, left bundle branch  IMPRESSION AND PLAN:   Mrs. Traywick is a 81 year old Caucasian gentleman with listed above medical problem comes to the emergency room from the "home with complaints of aches and pains and his hip and back pain. He also complained of some chest pain. Internal medicine was consulted for tachypnea as noted by ER physician.  1. Tachypnea likely secondary to activity as reported by patient's RN. Patient currently during my evaluation is breathing at 21/m. His vitals are stable. He is not hypoxic his sats are 95% on room air. He feels back to baseline.  2. Elevated troponin borderline with history of coronary artery disease status post CABG and some chest pain which was earlier has resolved. ER physician spoke with Dr. Gwen Pounds earlier. Patient will follow-up with Dr. Gwen Pounds 11:00 tomorrow morning. All his home meds will be will be continued.  3. Chronic A. fib heart rate stable. On anticoagulation with Xarelto.  4. BPH  5. Chronic kidney disease remain stable  Discharge plan was discussed with patient he is agreeable to it. Patient appears hematocrit is stable at present.  All the records are reviewed and case discussed with  Consulting provider. Management plans discussed with the patient nd  in agreement.  CODE STATUS: Full  TOTAL TIME TAKING CARE OF THIS PATIENT: 50 minutes.    Daijha Leggio M.D on 03/08/2015 at 4:26 PM  Between 7am to 6pm - Pager - [phone]  After 6pm go to [email or web address] - password [email or web address]  [facility]  Office  [phone]  CC: Primary care Physician: No PCP Per Patient

## 2015-03-08 NOTE — ED Notes (Signed)
89 yom PMHx CAD (s/p CABG), CHF, Afib (on Xarelto) CKD presents via EMS from Assisted Living facility with left sided chest pressure, intermittently. Onset this morning. Increase in SOB, and shakiness. No fevers/chills. No productive cough.

## 2015-03-08 NOTE — Discharge Instructions (Signed)
Return to the emergency room for increased pain in her chest, trouble breathing, or if you feel worse in any way. Follow closely with her cardiologist tomorrow. He we'll see you tomorrow to 11 please call to verify the time and the place first thing in the morning.

## 2015-03-08 NOTE — ED Notes (Addendum)
Martin [name] / Daughter Cell : [phone]

## 2015-03-10 ENCOUNTER — Emergency Department
Admission: EM | Admit: 2015-03-10 | Discharge: 2015-03-11 | Disposition: A | Discharge status: Home / Self Care | Payer: Medicare Other | Source: Home / Self Care | Attending: Emergency Medicine | Admitting: Emergency Medicine

## 2015-03-10 ENCOUNTER — Emergency Department: Payer: Medicare Other

## 2015-03-10 DIAGNOSIS — A419 Sepsis, unspecified organism: Secondary | ICD-10-CM | POA: Diagnosis not present

## 2015-03-10 DIAGNOSIS — B309 Viral conjunctivitis, unspecified: Secondary | ICD-10-CM

## 2015-03-10 DIAGNOSIS — R0600 Dyspnea, unspecified: Secondary | ICD-10-CM

## 2015-03-10 DIAGNOSIS — R0602 Shortness of breath: Secondary | ICD-10-CM | POA: Diagnosis not present

## 2015-03-10 DIAGNOSIS — M791 Myalgia, unspecified site: Secondary | ICD-10-CM

## 2015-03-10 LAB — CBC WITH DIFFERENTIAL/PLATELET
BASOS PCT: 1 %
Basophils Absolute: 0.1 10*3/uL (ref 0–0.1)
EOS ABS: 0.2 10*3/uL (ref 0–0.7)
Eosinophils Relative: 2 %
HCT: 36.7 % — ABNORMAL LOW (ref 40.0–52.0)
Hemoglobin: 12 g/dL — ABNORMAL LOW (ref 13.0–18.0)
Lymphocytes Relative: 7 %
Lymphs Abs: 1.1 10*3/uL (ref 1.0–3.6)
MCH: 30.2 pg (ref 26.0–34.0)
MCHC: 32.7 g/dL (ref 32.0–36.0)
MCV: 92.5 fL (ref 80.0–100.0)
MONO ABS: 1.1 10*3/uL — AB (ref 0.2–1.0)
MONOS PCT: 7 %
Neutro Abs: 12.3 10*3/uL — ABNORMAL HIGH (ref 1.4–6.5)
Neutrophils Relative %: 83 %
Platelets: 225 10*3/uL (ref 150–440)
RBC: 3.97 MIL/uL — ABNORMAL LOW (ref 4.40–5.90)
RDW: 12.9 % (ref 11.5–14.5)
WBC: 14.9 10*3/uL — ABNORMAL HIGH (ref 3.8–10.6)

## 2015-03-10 LAB — COMPREHENSIVE METABOLIC PANEL
ALBUMIN: 3.4 g/dL — AB (ref 3.5–5.0)
ALT: 34 U/L (ref 17–63)
AST: 33 U/L (ref 15–41)
Alkaline Phosphatase: 91 U/L (ref 38–126)
Anion gap: 8 (ref 5–15)
BUN: 30 mg/dL — ABNORMAL HIGH (ref 6–20)
CALCIUM: 8.4 mg/dL — AB (ref 8.9–10.3)
CHLORIDE: 104 mmol/L (ref 101–111)
CO2: 28 mmol/L (ref 22–32)
CREATININE: 2.23 mg/dL — AB (ref 0.61–1.24)
GFR calc Af Amer: 28 mL/min — ABNORMAL LOW (ref >60)
GFR calc non Af Amer: 24 mL/min — ABNORMAL LOW (ref >60)
Glucose, Bld: 272 mg/dL — ABNORMAL HIGH (ref 65–99)
Potassium: 3.7 mmol/L (ref 3.5–5.1)
SODIUM: 140 mmol/L (ref 135–145)
TOTAL PROTEIN: 6.9 g/dL (ref 6.5–8.1)
Total Bilirubin: 1 mg/dL (ref 0.3–1.2)

## 2015-03-10 LAB — URINALYSIS COMPLETE WITH MICROSCOPIC (ARMC ONLY)
BILIRUBIN URINE: NEGATIVE
Bacteria, UA: NONE SEEN
GLUCOSE, UA: 50 mg/dL — AB
Hgb urine dipstick: NEGATIVE
KETONES UR: NEGATIVE mg/dL
Leukocytes, UA: NEGATIVE
Nitrite: NEGATIVE
Protein, ur: NEGATIVE mg/dL
SPECIFIC GRAVITY, URINE: 1.015 (ref 1.005–1.030)
Squamous Epithelial / LPF: NONE SEEN
pH: 5 (ref 5.0–8.0)

## 2015-03-10 LAB — TROPONIN I: TROPONIN I: 0.04 ng/mL — AB (ref <0.031)

## 2015-03-10 LAB — BRAIN NATRIURETIC PEPTIDE: B Natriuretic Peptide: 206 pg/mL — ABNORMAL HIGH (ref 0.0–100.0)

## 2015-03-10 MED ORDER — FUROSEMIDE 10 MG/ML IJ SOLN
40.0000 mg | Freq: Once | INTRAMUSCULAR | Status: AC
  Administered 2015-03-11: 40 mg via INTRAVENOUS
  Filled 2015-03-10: qty 4

## 2015-03-10 NOTE — ED Notes (Signed)
Patient transported to X-ray 

## 2015-03-10 NOTE — ED Notes (Signed)
Pt brought to triage via wheelchair. Pt from [facility] [address]. Pt seen here 2days ago and was to be admitted but did not want to stay. Pt did follow up with his cardiologist yesterday. Pt today pt having pain all over, drainage from his eyes, facial swelling and increasing shortness of breath. Pt had temp of 99.5 at the nursing home.

## 2015-03-10 NOTE — ED Notes (Signed)
Pt. Here with family.  Pt. States having pain and weakness all over.  Pt. States swelling to the face and eyes draining.

## 2015-03-11 LAB — BLOOD GAS, ARTERIAL
ACID-BASE EXCESS: 3.5 mmol/L — AB (ref 0.0–3.0)
BICARBONATE: 26.5 meq/L (ref 21.0–28.0)
FIO2: 0.21
O2 Saturation: 92.5 %
PCO2 ART: 34 mmHg (ref 32.0–48.0)
PH ART: 7.5 — AB (ref 7.350–7.450)
PO2 ART: 59 mmHg — AB (ref 83.0–108.0)
Patient temperature: 37

## 2015-03-11 LAB — TROPONIN I: Troponin I: 0.06 ng/mL — ABNORMAL HIGH (ref <0.031)

## 2015-03-11 MED ORDER — IPRATROPIUM-ALBUTEROL 0.5-2.5 (3) MG/3ML IN SOLN
3.0000 mL | Freq: Once | RESPIRATORY_TRACT | Status: AC
  Administered 2015-03-11: 3 mL via RESPIRATORY_TRACT
  Filled 2015-03-11: qty 3

## 2015-03-11 NOTE — Discharge Instructions (Signed)
Viral Conjunctivitis Viral conjunctivitis is an inflammation of the clear membrane that covers the white part of your eye and the inner surface of your eyelid (conjunctiva). The inflammation is caused by a viral infection. The blood vessels in the conjunctiva become inflamed, causing the eye to become red or pink, and often itchy. Viral conjunctivitis can easily be passed from one person to another (contagious). CAUSES  Viral conjunctivitis is caused by a virus. A virus is a type of contagious germ. It can be spread by touching objects that have been contaminated with the virus, such as doorknobs or towels.  SYMPTOMS  Symptoms of viral conjunctivitis may include:   Eye redness.  Tearing or watery eyes.  Itchy eyes.  Burning feeling in the eyes.  Clear drainage from the eye.  Swollen eyelids.  A gritty feeling in the eye.  Light sensitivity. DIAGNOSIS  Viral conjunctivitis may be diagnosed with a medical history and physical exam. If you have discharge from your eye, the discharge may be tested to rule out other causes of conjunctivitis.  TREATMENT  Viral conjunctivitis does not respond to medicines that kill bacteria (antibiotics). Treatment for viral conjunctivitis is directed at stopping a bacterial infection from developing in addition to the viral infection. Treatment also aims to relieve your symptoms, such as itching. This may be done with antihistamine drops or other eye medicines. HOME CARE INSTRUCTIONS  Take medicines only as directed by your health care provider.  Avoid touching or rubbing your eyes.  Apply a warm, clean washcloth to your eye for 10-20 minutes, 3-4 times per day.  If you wear contact lenses, do not wear them until the inflammation is gone and your health care provider says it is safe to wear them again. Ask your health care provider how to sterilize or replace your contact lenses before using them again. Wear glasses until you can resume wearing  contacts.  Avoid wearing eye makeup until the inflammation is gone. Throw away any old eye cosmetics that may be contaminated.  Change or wash your pillowcase every day.  Do not share towels or washcloths. This may spread the infection.  Wash your hands often with soap and water. Use paper towels to dry your hands.  Gently wipe away any drainage from your eye with a warm, wet washcloth or a cotton ball.  Be very careful to avoid touching the edge of the eyelid with the eye drop bottle or ointment tube when applying medicines to the affected eye. This will stop you from spreading the infection to the other eye or to other people. SEEK MEDICAL CARE IF:   Your symptoms do not improve with treatment.  You have increased pain.  Your vision becomes blurry.  You have a fever.  You have facial pain, redness, or swelling.  You have new symptoms.  Your symptoms get worse.   This information is not intended to replace advice given to you by your health care provider. Make sure you discuss any questions you have with your health care provider.   Document Released: 07/13/2002 Document Revised: 10/14/2005 Document Reviewed: 02/01/2014 Elsevier Interactive Patient Education 2016 [employer]  Shortness of Breath Shortness of breath means you have trouble breathing. It could also mean that you have a medical problem. You should get immediate medical care for shortness of breath. CAUSES   Not enough oxygen in the air such as with high altitudes or a smoke-filled room.  Certain lung diseases, infections, or problems.  Heart disease or conditions,  such as angina or heart failure.  Low red blood cells (anemia).  Poor physical fitness, which can cause shortness of breath when you exercise.  Chest or back injuries or stiffness.  Being overweight.  Smoking.  Anxiety, which can make you feel like you are not getting enough air. DIAGNOSIS  Serious medical problems can often be found  during your physical exam. Tests may also be done to determine why you are having shortness of breath. Tests may include:  Chest X-rays.  Lung function tests.  Blood tests.  An electrocardiogram (ECG).  An ambulatory electrocardiogram. An ambulatory ECG records your heartbeat patterns over a 24-hour period.  Exercise testing.  A transthoracic echocardiogram (TTE). During echocardiography, sound waves are used to evaluate how blood flows through your heart.  A transesophageal echocardiogram (TEE).  Imaging scans. Your health care provider may not be able to find a cause for your shortness of breath after your exam. In this case, it is important to have a follow-up exam with your health care provider as directed.  TREATMENT  Treatment for shortness of breath depends on the cause of your symptoms and can vary greatly. HOME CARE INSTRUCTIONS   Do not smoke. Smoking is a common cause of shortness of breath. If you smoke, ask for help to quit.  Avoid being around chemicals or things that may bother your breathing, such as paint fumes and dust.  Rest as needed. Slowly resume your usual activities.  If medicines were prescribed, take them as directed for the full length of time directed. This includes oxygen and any inhaled medicines.  Keep all follow-up appointments as directed by your health care provider. SEEK MEDICAL CARE IF:   Your condition does not improve in the time expected.  You have a hard time doing your normal activities even with rest.  You have any new symptoms. SEEK IMMEDIATE MEDICAL CARE IF:   Your shortness of breath gets worse.  You feel light-headed, faint, or develop a cough not controlled with medicines.  You start coughing up blood.  You have pain with breathing.  You have chest pain or pain in your arms, shoulders, or abdomen.  You have a fever.  You are unable to walk up stairs or exercise the way you normally do. MAKE SURE YOU:  Understand  these instructions.  Will watch your condition.  Will get help right away if you are not doing well or get worse.   This information is not intended to replace advice given to you by your health care provider. Make sure you discuss any questions you have with your health care provider.   Document Released: 01/15/2001 Document Revised: 04/27/2013 Document Reviewed: 07/08/2011 Elsevier Interactive Patient Education [employer]

## 2015-03-11 NOTE — ED Notes (Signed)
Pt. Went back to facility with family.

## 2015-03-11 NOTE — ED Provider Notes (Signed)
[facility] Emergency Department Provider Note  ____________________________________________  Time seen: Approximately 2313 PM  I have reviewed the triage vital signs and the nursing notes.   HISTORY  Chief Complaint Shortness of Breath; Weakness; and Fever    HPI Martin [name] is a 81 y.o. male who comes into the hospital with difficulty breathing. The patient was here on Wednesday and was retaining fluid with a slight temperature and shortness of breath. The patient did not stay in the hospital and was discharged to follow-up with his cardiologist. According to the patient's family member he did not fill yesterday and seemed to have some swelling in his face and drainage from his eyes. He reports that he's been feeling achy all over. He saw his heart doctor on Thursday and had his medication changed around but they only started that medication yesterday. The patient has been feeling jittery and not feeling right. He has no known sick contacts but lives in assisted living. The patient did not get his flu shot and he's had some cough and wheezing. The patient has not had any nausea or vomiting, no abdominal pain and has not had his medication since 5 PM. Given the patient's age the family was concerned so they decided to bring him in again for further evaluation.   Past Medical History  Diagnosis Date  . Coronary artery disease   . CHF (congestive heart failure)     ischemic cardiomyopathy- EF 25%  . Hyperlipidemia   . Diabetes mellitus without complication   . Atrial fibrillation   . BPH (benign prostatic hyperplasia)   . Peripheral vascular disease   . Chronic kidney disease   . Insomnia     Patient Active Problem List   Diagnosis Date Noted  . Chest pain 10/01/2014  . Diabetes mellitus type 2 in obese (HCC) 10/01/2014  . Atrial fibrillation (HCC) 10/01/2014  . Chronic kidney disease, stage III (moderate) 10/01/2014  . Systolic congestive heart  failure (HCC) 10/01/2014  . Peripheral vascular disease (HCC) 10/01/2014  . Coronary artery disease 10/01/2014  . UTI (lower urinary tract infection) 09/10/2014  . Fall 09/10/2014    Past Surgical History  Procedure Laterality Date  . Cardiac surgery    . Coronary artery bypass graft      Current Outpatient Rx  Name  Route  Sig  Dispense  Refill  . aspirin 325 MG tablet   Oral   Take 325 mg by mouth every morning. Take with food and 4oz of fluid.         Tery Sanfilippo. Docusate Sodium 100 MG capsule   Oral   Take 100 mg by mouth 2 (two) times daily.         . furosemide (LASIX) 40 MG tablet   Oral   Take 40 mg by mouth daily. Take in afternoon as needed if shortness of breath or weight gain or 2 lbs occurs         . gabapentin (NEURONTIN) 300 MG capsule   Oral   Take 300 mg by mouth 3 (three) times daily.         . insulin glargine (LANTUS) 100 UNIT/ML injection   Subcutaneous   Inject 0.2 mLs (20 Units total) into the skin daily. Patient taking differently: Inject 28 Units into the skin daily.    10 mL   11   . insulin lispro (HUMALOG) 100 UNIT/ML injection   Subcutaneous   Inject 8 Units into the skin 3 (three) times  daily before meals.         . nitroGLYCERIN (NITROSTAT) 0.4 MG SL tablet   Sublingual   Place 1 tablet (0.4 mg total) under the tongue every 5 (five) minutes as needed for chest pain.   30 tablet   1   . oxyCODONE-acetaminophen (PERCOCET/ROXICET) 5-325 MG per tablet   Oral   Take 0.5 tablets by mouth at bedtime.         Marland Kitchen oxyCODONE-acetaminophen (PERCOCET/ROXICET) 5-325 MG per tablet   Oral   Take 0.5 tablets by mouth every 6 (six) hours as needed for moderate pain or severe pain.         . polyethylene glycol (MIRALAX / GLYCOLAX) packet   Oral   Take 17 g by mouth every Monday, Wednesday, and Friday.          . pravastatin (PRAVACHOL) 20 MG tablet   Oral   Take 20 mg by mouth at bedtime.          . rivaroxaban (XARELTO) 20 MG TABS  tablet   Oral   Take 20 mg by mouth daily.         Marland Kitchen terazosin (HYTRIN) 2 MG capsule   Oral   Take 2 mg by mouth daily.          . triazolam (HALCION) 0.25 MG tablet   Oral   Take 0.5 mg by mouth at bedtime.          . vitamin B-12 (CYANOCOBALAMIN) 500 MCG tablet   Oral   Take 500 mcg by mouth daily.           Allergies Review of patient's allergies indicates no known allergies.  No family history on file.  Social History Social History  Substance Use Topics  . Smoking status: Never Smoker   . Smokeless tobacco: Never Used  . Alcohol Use: No    Review of Systems Constitutional: chills Eyes: No visual changes. ENT: No sore throat. Cardiovascular: Denies chest pain. Respiratory:  shortness of breath. Gastrointestinal: No abdominal pain.  No nausea, no vomiting.  No diarrhea.  No constipation. Genitourinary: Negative for dysuria. Musculoskeletal: Muscle aches Skin: Negative for rash. Neurological: Generalized weakness  10-point ROS otherwise negative.  ____________________________________________   PHYSICAL EXAM:  VITAL SIGNS: ED Triage Vitals  Enc Vitals Group     BP 03/10/15 2134 161/94 mmHg     Pulse Rate 03/10/15 2134 84     Resp 03/10/15 2134 24     Temp 03/10/15 2134 98.4 F (36.9 C)     Temp Source 03/10/15 2134 Oral     SpO2 03/10/15 2134 93 %     Weight 03/10/15 2134 238 lb (107.956 kg)     Height 03/10/15 2134  (1.88 m)     Head Cir --      Peak Flow --      Pain Score 03/10/15 2137 10     Pain Loc --      Pain Edu? --      Excl. in GC? --     Constitutional: Alert and oriented. Well appearing and in moderate distress. Eyes: Conjunctivae are normal. With some drainage in bilateral eyes. Sclera clear PERRL. EOMI. Head: Atraumatic. Nose: No congestion/rhinnorhea. Mouth/Throat: Mucous membranes are moist.  Oropharynx non-erythematous. Cardiovascular: Normal rate, regular rhythm. Grossly normal heart sounds.  Good peripheral  circulation. Respiratory: Increased respiratory effort.  No retractions. With some mild wheezing. Gastrointestinal: Soft and nontender. No distention. Positive bowel sounds Musculoskeletal: Trace  lower extremity edema. Neurologic:  Normal speech and language.  Skin:  Skin is warm, dry and intact.  Psychiatric: Mood and affect are normal.   ____________________________________________   LABS (all labs ordered are listed, but only abnormal results are displayed)  Labs Reviewed  CBC WITH DIFFERENTIAL/PLATELET - Abnormal; Notable for the following:    WBC 14.9 (*)    RBC 3.97 (*)    Hemoglobin 12.0 (*)    HCT 36.7 (*)    Neutro Abs 12.3 (*)    Monocytes Absolute 1.1 (*)    All other components within normal limits  COMPREHENSIVE METABOLIC PANEL - Abnormal; Notable for the following:    Glucose, Bld 272 (*)    BUN 30 (*)    Creatinine, Ser 2.23 (*)    Calcium 8.4 (*)    Albumin 3.4 (*)    GFR calc non Af Amer 24 (*)    GFR calc Af Amer 28 (*)    All other components within normal limits  TROPONIN I - Abnormal; Notable for the following:    Troponin I 0.04 (*)    All other components within normal limits  URINALYSIS COMPLETEWITH MICROSCOPIC ([facility] ONLY) - Abnormal; Notable for the following:    Color, Urine YELLOW (*)    APPearance CLEAR (*)    Glucose, UA 50 (*)    All other components within normal limits  BRAIN NATRIURETIC PEPTIDE - Abnormal; Notable for the following:    B Natriuretic Peptide 206.0 (*)    All other components within normal limits  TROPONIN I - Abnormal; Notable for the following:    Troponin I 0.06 (*)    All other components within normal limits  BLOOD GAS, ARTERIAL - Abnormal; Notable for the following:    pH, Arterial 7.50 (*)    pO2, Arterial 59 (*)    Acid-Base Excess 3.5 (*)    All other components within normal limits   ____________________________________________  EKG  ED ECG REPORT I, Rebecka Apley, the attending physician,  personally viewed and interpreted this ECG.   Date: 03/10/2015  EKG Time: 2136  Rate: 86  Rhythm: atrial fibrillation, rate 86  Axis: left axis deviation  Intervals:left bundle branch block seen 12/2014  ST&T Change: flipped t waves I and avL seen 12/2014  ____________________________________________  RADIOLOGY  CXR: No acute cardiopulmonary disease, no convincing CHF ____________________________________________   PROCEDURES  Procedure(s) performed: None  Critical Care performed: No  ____________________________________________   INITIAL IMPRESSION / ASSESSMENT AND PLAN / ED COURSE  Pertinent labs & imaging results that were available during my care of the patient were reviewed by me and considered in my medical decision making (see chart for details).  This is a 81 year old male who comes in today with some shortness of breath generalized muscle aches and not feeling well. The patient does have a mild cough as well as some eye drainage without significantly injected conjunctiva or sclera. I feel that the patient's symptoms may be viral as well as some due to his heart failure. I did give the patient a dose of Lasix 40 mg IV 1 dose as well as a DuoNeb. After the medications the patient was feeling better and did not have any more shortness of breath with wheezing. We repeated the patient's troponin and it came back as 0.06 was was a mild elevation from today but the same as previous. When asked if the patient wanted to stay in the hospital he adamantly refused to stay in  the hospital. He reports that he wants to go home. I feel that the troponin can be attributed to her normal lab standard deviation and I feel that it is appropriate to send the patient home. The patient will follow back up with his primary care physician and his cardiologist. ____________________________________________   FINAL CLINICAL IMPRESSION(S) / ED DIAGNOSES  Final diagnoses:  Viral conjunctivitis   Myalgia  Dyspnea      Rebecka Apley, MD 03/11/15 [phone]

## 2015-03-14 ENCOUNTER — Emergency Department: Payer: Medicare Other

## 2015-03-14 ENCOUNTER — Inpatient Hospital Stay
Admission: EM | Admit: 2015-03-14 | Discharge: 2015-03-18 | DRG: 871 | Disposition: A | Discharge status: Home Health | Payer: Medicare Other | Attending: Internal Medicine | Admitting: Internal Medicine

## 2015-03-14 DIAGNOSIS — N183 Chronic kidney disease, stage 3 (moderate): Secondary | ICD-10-CM | POA: Diagnosis present

## 2015-03-14 DIAGNOSIS — E119 Type 2 diabetes mellitus without complications: Secondary | ICD-10-CM | POA: Diagnosis present

## 2015-03-14 DIAGNOSIS — H9190 Unspecified hearing loss, unspecified ear: Secondary | ICD-10-CM | POA: Diagnosis present

## 2015-03-14 DIAGNOSIS — R0602 Shortness of breath: Secondary | ICD-10-CM | POA: Diagnosis not present

## 2015-03-14 DIAGNOSIS — I2510 Atherosclerotic heart disease of native coronary artery without angina pectoris: Secondary | ICD-10-CM | POA: Diagnosis present

## 2015-03-14 DIAGNOSIS — I5022 Chronic systolic (congestive) heart failure: Secondary | ICD-10-CM

## 2015-03-14 DIAGNOSIS — Z951 Presence of aortocoronary bypass graft: Secondary | ICD-10-CM | POA: Diagnosis not present

## 2015-03-14 DIAGNOSIS — J189 Pneumonia, unspecified organism: Secondary | ICD-10-CM | POA: Diagnosis present

## 2015-03-14 DIAGNOSIS — Z79899 Other long term (current) drug therapy: Secondary | ICD-10-CM | POA: Diagnosis not present

## 2015-03-14 DIAGNOSIS — E785 Hyperlipidemia, unspecified: Secondary | ICD-10-CM | POA: Diagnosis present

## 2015-03-14 DIAGNOSIS — I4891 Unspecified atrial fibrillation: Secondary | ICD-10-CM | POA: Diagnosis present

## 2015-03-14 DIAGNOSIS — B309 Viral conjunctivitis, unspecified: Secondary | ICD-10-CM | POA: Diagnosis present

## 2015-03-14 DIAGNOSIS — I255 Ischemic cardiomyopathy: Secondary | ICD-10-CM | POA: Diagnosis present

## 2015-03-14 DIAGNOSIS — N400 Enlarged prostate without lower urinary tract symptoms: Secondary | ICD-10-CM | POA: Diagnosis present

## 2015-03-14 DIAGNOSIS — Z23 Encounter for immunization: Secondary | ICD-10-CM | POA: Diagnosis not present

## 2015-03-14 DIAGNOSIS — E669 Obesity, unspecified: Secondary | ICD-10-CM

## 2015-03-14 DIAGNOSIS — I739 Peripheral vascular disease, unspecified: Secondary | ICD-10-CM | POA: Diagnosis present

## 2015-03-14 DIAGNOSIS — Z79891 Long term (current) use of opiate analgesic: Secondary | ICD-10-CM | POA: Diagnosis not present

## 2015-03-14 DIAGNOSIS — I5023 Acute on chronic systolic (congestive) heart failure: Secondary | ICD-10-CM | POA: Diagnosis present

## 2015-03-14 DIAGNOSIS — A419 Sepsis, unspecified organism: Secondary | ICD-10-CM | POA: Diagnosis present

## 2015-03-14 DIAGNOSIS — E1169 Type 2 diabetes mellitus with other specified complication: Secondary | ICD-10-CM

## 2015-03-14 DIAGNOSIS — Z7982 Long term (current) use of aspirin: Secondary | ICD-10-CM | POA: Diagnosis not present

## 2015-03-14 DIAGNOSIS — Z794 Long term (current) use of insulin: Secondary | ICD-10-CM | POA: Diagnosis not present

## 2015-03-14 LAB — BASIC METABOLIC PANEL
Anion gap: 10 (ref 5–15)
BUN: 28 mg/dL — AB (ref 6–20)
CHLORIDE: 97 mmol/L — AB (ref 101–111)
CO2: 30 mmol/L (ref 22–32)
CREATININE: 1.71 mg/dL — AB (ref 0.61–1.24)
Calcium: 8.8 mg/dL — ABNORMAL LOW (ref 8.9–10.3)
GFR, EST AFRICAN AMERICAN: 39 mL/min — AB (ref >60)
GFR, EST NON AFRICAN AMERICAN: 33 mL/min — AB (ref >60)
Glucose, Bld: 287 mg/dL — ABNORMAL HIGH (ref 65–99)
Potassium: 3.2 mmol/L — ABNORMAL LOW (ref 3.5–5.1)
SODIUM: 137 mmol/L (ref 135–145)

## 2015-03-14 LAB — CBC WITH DIFFERENTIAL/PLATELET
BASOS PCT: 0 %
Basophils Absolute: 0.1 10*3/uL (ref 0–0.1)
EOS ABS: 0.1 10*3/uL (ref 0–0.7)
EOS PCT: 0 %
HCT: 35.3 % — ABNORMAL LOW (ref 40.0–52.0)
HEMOGLOBIN: 11.6 g/dL — AB (ref 13.0–18.0)
Lymphocytes Relative: 7 %
Lymphs Abs: 1.2 10*3/uL (ref 1.0–3.6)
MCH: 30.5 pg (ref 26.0–34.0)
MCHC: 32.9 g/dL (ref 32.0–36.0)
MCV: 92.6 fL (ref 80.0–100.0)
MONOS PCT: 8 %
Monocytes Absolute: 1.5 10*3/uL — ABNORMAL HIGH (ref 0.2–1.0)
NEUTROS PCT: 85 %
Neutro Abs: 14.7 10*3/uL — ABNORMAL HIGH (ref 1.4–6.5)
PLATELETS: 287 10*3/uL (ref 150–440)
RBC: 3.81 MIL/uL — AB (ref 4.40–5.90)
RDW: 12.9 % (ref 11.5–14.5)
WBC: 17.6 10*3/uL — AB (ref 3.8–10.6)

## 2015-03-14 LAB — TROPONIN I: TROPONIN I: 0.03 ng/mL (ref <0.031)

## 2015-03-14 LAB — BRAIN NATRIURETIC PEPTIDE: B NATRIURETIC PEPTIDE 5: 437 pg/mL — AB (ref 0.0–100.0)

## 2015-03-14 MED ORDER — ACETAMINOPHEN 325 MG PO TABS
650.0000 mg | ORAL_TABLET | Freq: Four times a day (QID) | ORAL | Status: DC | PRN
  Administered 2015-03-16 – 2015-03-18 (×3): 650 mg via ORAL
  Filled 2015-03-14 (×3): qty 2

## 2015-03-14 MED ORDER — LEVOFLOXACIN IN D5W 750 MG/150ML IV SOLN
750.0000 mg | Freq: Once | INTRAVENOUS | Status: DC
  Administered 2015-03-14: 750 mg via INTRAVENOUS
  Filled 2015-03-14: qty 150

## 2015-03-14 MED ORDER — DEXTROSE 5 % IV SOLN: 1.0000 g | Freq: Once | INTRAVENOUS | Status: DC

## 2015-03-14 MED ORDER — POTASSIUM CHLORIDE CRYS ER 20 MEQ PO TBCR
40.0000 meq | EXTENDED_RELEASE_TABLET | Freq: Once | ORAL | Status: AC
  Administered 2015-03-15: 40 meq via ORAL
  Filled 2015-03-14: qty 2

## 2015-03-14 MED ORDER — ONDANSETRON HCL 4 MG PO TABS: 4.0000 mg | ORAL_TABLET | Freq: Four times a day (QID) | ORAL | Status: DC | PRN

## 2015-03-14 MED ORDER — ONDANSETRON HCL 4 MG/2ML IJ SOLN: 4.0000 mg | Freq: Four times a day (QID) | INTRAMUSCULAR | Status: DC | PRN

## 2015-03-14 MED ORDER — MORPHINE SULFATE (PF) 2 MG/ML IV SOLN: 2.0000 mg | INTRAVENOUS | Status: DC | PRN

## 2015-03-14 MED ORDER — DEXTROSE 5 % IV SOLN
2.0000 g | Freq: Once | INTRAVENOUS | Status: AC
  Administered 2015-03-15: 2 g via INTRAVENOUS
  Filled 2015-03-14: qty 2

## 2015-03-14 MED ORDER — OXYCODONE HCL 5 MG PO TABS: 5.0000 mg | ORAL_TABLET | ORAL | Status: DC | PRN

## 2015-03-14 MED ORDER — HEPARIN SODIUM (PORCINE) 5000 UNIT/ML IJ SOLN: 5000.0000 [IU] | Freq: Three times a day (TID) | INTRAMUSCULAR | Status: DC

## 2015-03-14 MED ORDER — FUROSEMIDE 10 MG/ML IJ SOLN
40.0000 mg | Freq: Once | INTRAMUSCULAR | Status: AC
  Administered 2015-03-14: 40 mg via INTRAVENOUS
  Filled 2015-03-14: qty 4

## 2015-03-14 MED ORDER — ACETAMINOPHEN 650 MG RE SUPP
650.0000 mg | Freq: Four times a day (QID) | RECTAL | Status: DC | PRN
Start: 2015-03-14 — End: 2015-03-18

## 2015-03-14 MED ORDER — IPRATROPIUM-ALBUTEROL 0.5-2.5 (3) MG/3ML IN SOLN
3.0000 mL | Freq: Once | RESPIRATORY_TRACT | Status: AC
  Administered 2015-03-14: 3 mL via RESPIRATORY_TRACT
  Filled 2015-03-14: qty 3

## 2015-03-14 MED ORDER — SODIUM CHLORIDE 0.9 % IJ SOLN
3.0000 mL | Freq: Two times a day (BID) | INTRAMUSCULAR | Status: DC
  Administered 2015-03-15 – 2015-03-17 (×7): 3 mL via INTRAVENOUS

## 2015-03-14 MED ORDER — IPRATROPIUM-ALBUTEROL 0.5-2.5 (3) MG/3ML IN SOLN
3.0000 mL | RESPIRATORY_TRACT | Status: DC | PRN
  Administered 2015-03-15 – 2015-03-17 (×6): 3 mL via RESPIRATORY_TRACT
  Filled 2015-03-14 (×7): qty 3

## 2015-03-14 MED ORDER — DEXTROSE 5 % IV SOLN: 500.0000 mg | Freq: Once | INTRAVENOUS | Status: DC

## 2015-03-14 NOTE — ED Notes (Signed)
Family at bedside. 

## 2015-03-14 NOTE — ED Notes (Signed)
Pt daughter reports patient was seen here 03/08/15 and 03/10/15 and was dx with viral infection. Daughter states pt has been getting worse this weekend. Pt coughing up white mucous sputum.

## 2015-03-14 NOTE — ED Provider Notes (Signed)
[facility] Emergency Department Provider Note    ____________________________________________  Time seen: On EMS arrival  I have reviewed the triage vital signs and the nursing notes.   HISTORY  Chief Complaint Shortness of Breath   History limited by: Not Limited   HPI Martin [name] is a 81 y.o. male with history of CHF and coronary artery disease who presents to the emergency department today because of concerns for shortness breath. The patient states that it got worse throughout the day today. The patient is coming from a nursing facility. They state that they called the doctor to see if they could give him a breathing treatment and based on the vital signs and the doctor advised that the patient be sent to the ED. The patient denies any chest pain or fevers. He denies productive cough.  Past Medical History  Diagnosis Date  . Coronary artery disease   . CHF (congestive heart failure)     ischemic cardiomyopathy- EF 25%  . Hyperlipidemia   . Diabetes mellitus without complication   . Atrial fibrillation   . BPH (benign prostatic hyperplasia)   . Peripheral vascular disease   . Chronic kidney disease   . Insomnia     Patient Active Problem List   Diagnosis Date Noted  . Chest pain 10/01/2014  . Diabetes mellitus type 2 in obese (HCC) 10/01/2014  . Atrial fibrillation (HCC) 10/01/2014  . Chronic kidney disease, stage III (moderate) 10/01/2014  . Systolic congestive heart failure (HCC) 10/01/2014  . Peripheral vascular disease (HCC) 10/01/2014  . Coronary artery disease 10/01/2014  . UTI (lower urinary tract infection) 09/10/2014  . Fall 09/10/2014    Past Surgical History  Procedure Laterality Date  . Cardiac surgery    . Coronary artery bypass graft      Current Outpatient Rx  Name  Route  Sig  Dispense  Refill  . aspirin 325 MG tablet   Oral   Take 325 mg by mouth every morning. Take with food and 4oz of fluid.         Tery Sanfilippo.  Docusate Sodium 100 MG capsule   Oral   Take 100 mg by mouth 2 (two) times daily.         . furosemide (LASIX) 40 MG tablet   Oral   Take 40 mg by mouth daily. Take in afternoon as needed if shortness of breath or weight gain or 2 lbs occurs         . gabapentin (NEURONTIN) 300 MG capsule   Oral   Take 300 mg by mouth 3 (three) times daily.         . insulin glargine (LANTUS) 100 UNIT/ML injection   Subcutaneous   Inject 0.2 mLs (20 Units total) into the skin daily. Patient taking differently: Inject 28 Units into the skin daily.    10 mL   11   . insulin lispro (HUMALOG) 100 UNIT/ML injection   Subcutaneous   Inject 8 Units into the skin 3 (three) times daily before meals.         . nitroGLYCERIN (NITROSTAT) 0.4 MG SL tablet   Sublingual   Place 1 tablet (0.4 mg total) under the tongue every 5 (five) minutes as needed for chest pain.   30 tablet   1   . oxyCODONE-acetaminophen (PERCOCET/ROXICET) 5-325 MG per tablet   Oral   Take 0.5 tablets by mouth at bedtime.         Marland Kitchen. oxyCODONE-acetaminophen (PERCOCET/ROXICET) 5-325  MG per tablet   Oral   Take 0.5 tablets by mouth every 6 (six) hours as needed for moderate pain or severe pain.         . polyethylene glycol (MIRALAX / GLYCOLAX) packet   Oral   Take 17 g by mouth every Monday, Wednesday, and Friday.          . pravastatin (PRAVACHOL) 20 MG tablet   Oral   Take 20 mg by mouth at bedtime.          . rivaroxaban (XARELTO) 20 MG TABS tablet   Oral   Take 20 mg by mouth daily.         Marland Kitchen terazosin (HYTRIN) 2 MG capsule   Oral   Take 2 mg by mouth daily.          . triazolam (HALCION) 0.25 MG tablet   Oral   Take 0.5 mg by mouth at bedtime.          . vitamin B-12 (CYANOCOBALAMIN) 500 MCG tablet   Oral   Take 500 mcg by mouth daily.           Allergies Review of patient's allergies indicates no known allergies.  No family history on file.  Social History Social History    Substance Use Topics  . Smoking status: Never Smoker   . Smokeless tobacco: Never Used  . Alcohol Use: No    Review of Systems  Constitutional: Negative for fever. Cardiovascular: Negative for chest pain. Respiratory: Positive for shortness of breath. Gastrointestinal: Negative for abdominal pain, vomiting and diarrhea. Genitourinary: Negative for dysuria. Musculoskeletal: Negative for back pain. Skin: Negative for rash. Neurological: Negative for headaches, focal weakness or numbness.   10-point ROS otherwise negative.  ____________________________________________   PHYSICAL EXAM:  VITAL SIGNS:   98.6 F (37 C)   101   36   153/84 mmHg  94 %   Constitutional: Alert and oriented. Appears in mild respiratory distress. Eyes: Conjunctivae are normal. PERRL. Normal extraocular movements. ENT   Head: Normocephalic and atraumatic.   Nose: No congestion/rhinnorhea.   Mouth/Throat: Mucous membranes are moist.   Neck: No stridor. Hematological/Lymphatic/Immunilogical: No cervical lymphadenopathy. Cardiovascular: Tachycardic, regular rhythm.  No murmurs, rubs, or gallops. Respiratory: Mildly increased respiratory effort. Bilateral wheezing Gastrointestinal: Soft and nontender. No distention.  Genitourinary: Deferred Musculoskeletal: Normal range of motion in all extremities. No joint effusions.  No lower extremity tenderness nor edema. Neurologic:  Normal speech and language. No gross focal neurologic deficits are appreciated.  Skin:  Skin is warm, dry and intact. No rash noted. Psychiatric: Mood and affect are normal. Speech and behavior are normal. Patient exhibits appropriate insight and judgment.  ____________________________________________    LABS (pertinent positives/negatives)  Labs Reviewed  CBC WITH DIFFERENTIAL/PLATELET - Abnormal; Notable for the following:    WBC 17.6 (*)    RBC 3.81 (*)    Hemoglobin 11.6 (*)    HCT 35.3 (*)    Neutro Abs  14.7 (*)    Monocytes Absolute 1.5 (*)    All other components within normal limits  BASIC METABOLIC PANEL - Abnormal; Notable for the following:    Potassium 3.2 (*)    Chloride 97 (*)    Glucose, Bld 287 (*)    BUN 28 (*)    Creatinine, Ser 1.71 (*)    Calcium 8.8 (*)    GFR calc non Af Amer 33 (*)    GFR calc Af Amer 39 (*)    All  other components within normal limits  BRAIN NATRIURETIC PEPTIDE - Abnormal; Notable for the following:    B Natriuretic Peptide 437.0 (*)    All other components within normal limits  TROPONIN I     ____________________________________________   EKG  I, Phineas Semen, attending physician, personally viewed and interpreted this EKG  EKG Time: 1922 Rate: 102 Rhythm: atrial fibrillation Axis: left axis deviation Intervals: qtc 510 QRS: LBBB ST changes: no st elevation equivalent Impression: abnormal EKG ____________________________________________    RADIOLOGY  Chest x-ray  IMPRESSION: 1. Cardiomegaly and interstitial edema. 2. Cannot exclude infectious process at the lung bases.   ____________________________________________   PROCEDURES  Procedure(s) performed: None  Critical Care performed: No  ____________________________________________   INITIAL IMPRESSION / ASSESSMENT AND PLAN / ED COURSE  Pertinent labs & imaging results that were available during my care of the patient were reviewed by me and considered in my medical decision making (see chart for details).  Patient presents to the emergency department today with concerns for shortness breath. On exam patient does have some mild respiratory distress. The patient x-ray was concerning for interstitial edema and possible pneumonia. Given these findings will plan on giving antibiotics and Lasix and admission to the hospital.  ____________________________________________   FINAL CLINICAL IMPRESSION(S) / ED DIAGNOSES  Final diagnoses:  Shortness of breath      Phineas Semen, MD 03/15/15 1642

## 2015-03-14 NOTE — ED Notes (Signed)
Pt has yellow/green gunk around both eyes. Eyes were wiped with damp towel. Eyes are clear now, pt reports no pain.

## 2015-03-14 NOTE — ED Notes (Signed)
Family states that pt a hard time seeing and is hard of hearing. Family asks that staff does not give pt anything to read, such as a meal plan to choose from. Pt does have hearing aids.

## 2015-03-14 NOTE — ED Notes (Signed)
Pt presents to ED via EMS with shortness of breath, cough. Per EMS, nurse from facility wanted him to get a breathing treatment and mucinex. Doctor from facility said he needed to be evaluated in the ED.

## 2015-03-14 NOTE — ED Notes (Signed)
Per Dr. Dimas AguasHoward, Levaquin will finish. Rocephin will not be given yet.

## 2015-03-14 NOTE — Progress Notes (Signed)
ANTIBIOTIC CONSULT NOTE - INITIAL  Pharmacy Consult for azithromycin/ceftriaxone Indication: rule out pneumonia and rule out sepsis  No Known Allergies  Patient Measurements: Height: 6' (182.9 cm) Weight: 238 lb (107.956 kg) IBW/kg (Calculated) : 77.6 Adjusted Body Weight:   Vital Signs: Temp: 98.6 F (37 C) (11/08 1923) Temp Source: Oral (11/08 1923) BP: 145/70 mmHg (11/08 2200) Pulse Rate: 93 (11/08 2200) Intake/Output from previous day:   Intake/Output from this shift:    Labs:  Recent Labs  03/14/15 1928  WBC 17.6*  HGB 11.6*  PLT 287  CREATININE 1.71*   Estimated Creatinine Clearance: 36.5 mL/min (by C-G formula based on Cr of 1.71). No results for input(s): VANCOTROUGH, VANCOPEAK, VANCORANDOM, GENTTROUGH, GENTPEAK, GENTRANDOM, TOBRATROUGH, TOBRAPEAK, TOBRARND, AMIKACINPEAK, AMIKACINTROU, AMIKACIN in the last 72 hours.   Microbiology: No results found for this or any previous visit (from the past 720 hour(s)).  Medical History: Past Medical History  Diagnosis Date  . Coronary artery disease   . CHF (congestive heart failure) (HCC)     ischemic cardiomyopathy- EF 25%  . Hyperlipidemia   . Diabetes mellitus without complication (HCC)   . Atrial fibrillation (HCC)   . BPH (benign prostatic hyperplasia)   . Peripheral vascular disease (HCC)   . Chronic kidney disease   . Insomnia     Medications:  Infusions:  . azithromycin    . cefTRIAXone (ROCEPHIN)  IV     Assessment: 89 yom with SOB per EMS from nursing facility, concern for CAP and sepsis, starting azithromycin/ceftriaxone  Goal of Therapy:    Plan:  Expected duration 7 days with resolution of temperature and/or normalization of WBC. Azithromycin 500 mg IV Q24H and ceftriaxone 2 gm IV Q24H will continue to follow and convert to PO when able.   Carola FrostNathan A Ashlin Hidalgo, Pharm.D. Clinical Pharmacist 03/14/2015,10:30 PM

## 2015-03-14 NOTE — H&P (Signed)
[facility] [place] at [facility]   PATIENT NAME: Martin [name]    MR#:  [id number]  DATE OF BIRTH:  19-Apr-1922   DATE OF ADMISSION:  03/14/2015  PRIMARY CARE PHYSICIAN:  doctors making house calls  REQUESTING/REFERRING PHYSICIAN: Derrill Kay  CHIEF COMPLAINT:   Chief Complaint  Patient presents with  . Shortness of Breath    HISTORY OF PRESENT ILLNESS:  Martin [name]  is a 81 y.o. male with a known history of systolic congestive heart failure ejection fraction 25% who is presenting with shortness of breath. The patient is a somewhat poor historian stating "just put me in my room so I can go to sleep" history aided by family members present at bedside. They described one week duration of worsening progressive symptoms shortness of breath with associated cough. 4 days ago was evaluated in the Hospital system and diagnosed with viral respiratory illnesses and discharged. Since that time he continued to worsen now to the point of having shortness of breath at rest with tachypnea. As well as having fevers and chills. On arrival to the emergency department tachycardic and tachypneic requiring supplemental oxygen to maintain SaO2 greater than 92%.  PAST MEDICAL HISTORY:   Past Medical History  Diagnosis Date  . Coronary artery disease   . CHF (congestive heart failure) (HCC)     ischemic cardiomyopathy- EF 25%  . Hyperlipidemia   . Diabetes mellitus without complication (HCC)   . Atrial fibrillation (HCC)   . BPH (benign prostatic hyperplasia)   . Peripheral vascular disease (HCC)   . Chronic kidney disease   . Insomnia     PAST SURGICAL HISTORY:   Past Surgical History  Procedure Laterality Date  . Cardiac surgery    . Coronary artery bypass graft      SOCIAL HISTORY:   Social History  Substance Use Topics  . Smoking status: Never Smoker   . Smokeless tobacco: Never Used  . Alcohol Use: No    FAMILY HISTORY:   Family History  Problem  Relation Age of Onset  . Diabetes Other     DRUG ALLERGIES:  No Known Allergies  REVIEW OF SYSTEMS:  REVIEW OF SYSTEMS:  CONSTITUTIONAL: Positive fevers, chills, fatigue, weakness.  EYES: Denies blurred vision, double vision, or eye pain.  EARS, NOSE, THROAT: Denies tinnitus, ear pain, hearing loss.  RESPIRATORY: Positive cough, shortness of breath, wheezing  CARDIOVASCULAR: Denies chest pain, palpitations, edema.  GASTROINTESTINAL: Denies nausea, vomiting, diarrhea, abdominal pain.  GENITOURINARY: Denies dysuria, hematuria.  ENDOCRINE: Denies nocturia or thyroid problems. HEMATOLOGIC AND LYMPHATIC: Denies easy bruising or bleeding.  SKIN: Denies rash or lesions.  MUSCULOSKELETAL: Denies pain in neck, back, shoulder, knees, hips, or further arthritic symptoms.  NEUROLOGIC: Denies paralysis, paresthesias.  PSYCHIATRIC: Denies anxiety or depressive symptoms. Otherwise full review of systems performed by me is negative.   MEDICATIONS AT HOME:   Prior to Admission medications   Medication Sig Start Date End Date Taking? Authorizing Provider  aspirin 325 MG tablet Take 325 mg by mouth daily.    Yes Historical Provider, MD  dextromethorphan-guaiFENesin (MUCINEX DM) 30-600 MG 12hr tablet Take 1 tablet by mouth every 12 (twelve) hours.   Yes Historical Provider, MD  dextrose (GLUTOSE) 40 % GEL Take 1 Tube by mouth as needed for low blood sugar.   Yes Historical Provider, MD  docusate sodium (COLACE) 100 MG capsule Take 100 mg by mouth 2 (two) times daily.   Yes Historical Provider, MD  furosemide (LASIX)  40 MG tablet Take 40 mg by mouth daily as needed (for shortness of breath or weight gain of 2lbs or more).    Yes Historical Provider, MD  gabapentin (NEURONTIN) 300 MG capsule Take 300 mg by mouth 3 (three) times daily.   Yes Historical Provider, MD  guaiFENesin-dextromethorphan (ROBITUSSIN DM) 100-10 MG/5ML syrup Take 15 mLs by mouth every 4 (four) hours as needed for cough.   Yes  Historical Provider, MD  insulin glargine (LANTUS) 100 UNIT/ML injection Inject 28 Units into the skin daily.   Yes Historical Provider, MD  insulin lispro (HUMALOG) 100 UNIT/ML injection Inject 8 Units into the skin 3 (three) times daily before meals.   Yes Historical Provider, MD  nitroGLYCERIN (NITROSTAT) 0.4 MG SL tablet Place 1 tablet (0.4 mg total) under the tongue every 5 (five) minutes as needed for chest pain. 09/12/14  Yes Houston SirenVivek J Sainani, MD  oxyCODONE-acetaminophen (PERCOCET/ROXICET) 5-325 MG per tablet Take 0.5 tablets by mouth at bedtime. Pt also takes one-half tablet every six hours as needed for moderate/severe pain.   Yes Historical Provider, MD  polyethylene glycol (MIRALAX / GLYCOLAX) packet Take 17 g by mouth every Monday, Wednesday, and Friday.    Yes Historical Provider, MD  pravastatin (PRAVACHOL) 20 MG tablet Take 20 mg by mouth at bedtime.    Yes Historical Provider, MD  rivaroxaban (XARELTO) 20 MG TABS tablet Take 20 mg by mouth daily.   Yes Historical Provider, MD  terazosin (HYTRIN) 2 MG capsule Take 2 mg by mouth at bedtime.    Yes Historical Provider, MD  triazolam (HALCION) 0.25 MG tablet Take 0.5 mg by mouth at bedtime.    Yes Historical Provider, MD  vitamin B-12 (CYANOCOBALAMIN) 500 MCG tablet Take 500 mcg by mouth daily.   Yes Historical Provider, MD      VITAL SIGNS:  Blood pressure 145/70, pulse 93, temperature 98.6 F (37 C), temperature source Oral, resp. rate 33, height 6' (1.829 m), weight 238 lb (107.956 kg), SpO2 94 %.  PHYSICAL EXAMINATION:  VITAL SIGNS: Filed Vitals:   03/14/15 2200  BP: 145/70  Pulse: 93  Temp:   Resp: 33   GENERAL:81 y.o.male currently in moderate acute distress given respiratory status.  HEAD: Normocephalic, atraumatic.  EYES: Pupils equal, round, reactive to light. Extraocular muscles intact. No scleral icterus. Bilateral conjunctivitis with white/yellow discharge/crusting MOUTH: Markedly dry mucosal membrane. Dentition  intact. No abscess noted.  EAR, NOSE, THROAT: Clear without exudates. No external lesions.  NECK: Supple. No thyromegaly. No nodules. No JVD.  PULMONARY: Diffuse coarse breath sounds, however diminished, basilar rhonchi right greater than left tachypneic without use of accessory muscles, poor respiratory effort. Poor air entry bilaterally CHEST: Nontender to palpation.  CARDIOVASCULAR: S1 and S2. Tachycardic No murmurs, rubs, or gallops. Trace edema. Pedal pulses 2+ bilaterally.  GASTROINTESTINAL: Soft, nontender, nondistended. No masses. Positive bowel sounds. No hepatosplenomegaly.  MUSCULOSKELETAL: No swelling, clubbing, or edema. Range of motion full in all extremities.  NEUROLOGIC: Cranial nerves II through XII are intact. No gross focal neurological deficits. Sensation intact. Reflexes intact.  SKIN: No ulceration, lesions, rashes, or cyanosis. Skin warm and dry. Turgor intact.  PSYCHIATRIC: Mood, affect within normal limits. The patient is awake, alert and oriented x 3. Insight, judgment intact.    LABORATORY PANEL:   CBC  Recent Labs Lab 03/14/15 1928  WBC 17.6*  HGB 11.6*  HCT 35.3*  PLT 287   ------------------------------------------------------------------------------------------------------------------  Chemistries   Recent Labs Lab 03/10/15 2249 03/14/15 1928  NA 140 137  K 3.7 3.2*  CL 104 97*  CO2 28 30  GLUCOSE 272* 287*  BUN 30* 28*  CREATININE 2.23* 1.71*  CALCIUM 8.4* 8.8*  AST 33  --   ALT 34  --   ALKPHOS 91  --   BILITOT 1.0  --    ------------------------------------------------------------------------------------------------------------------  Cardiac Enzymes  Recent Labs Lab 03/14/15 1928  TROPONINI 0.03   ------------------------------------------------------------------------------------------------------------------  RADIOLOGY:  Dg Chest Portable 1 View  03/14/2015  CLINICAL DATA:  Pt presents to ED via EMS with shortness of  breath, cough. Per EMS, nurse from facility wanted him to get a breathing treatment and Mucinex. Doctor from facility said he needed to be evaluated in the ED. EXAM: PORTABLE CHEST 1 VIEW COMPARISON:  03/10/2015 FINDINGS: Status post median sternotomy and CABG. Heart is enlarged. There are interstitial markings at the lung bases consistent with mild interstitial edema. More confluent opacity at the bases raises a question of superimposed infectious process, right greater than left. No pleural effusions or overt alveolar edema. IMPRESSION: 1. Cardiomegaly and interstitial edema. 2. Cannot exclude infectious process at the lung bases. Electronically Signed   By: Norva Pavlov M.D.   On: 03/14/2015 20:08    EKG:   Orders placed or performed during the hospital encounter of 03/14/15  . EKG 12-Lead  . EKG 12-Lead    IMPRESSION AND PLAN:   81 year old Caucasian gentleman history of ischemic cardiomyopathy with ejection fraction of 25% who is presenting with shortness of breath.  1.Sepsis, meeting septic criteria by leukocytosis, tachycardia, tachypnea present on arrival. Source nearly acquired pneumonia  Panculture. Broad-spectrum antibiotics including ceftriaxone/azithromycin and taper antibiotics when culture data returns. He has not received fluid bolus given pulmonary edema and systolic heart failure. Elye fluid status if tolerates IV fluid hydration keep mean arterial pressure greater than 65. He may require pressor therapy if blood pressure worsens. We will repeat lactic acid if the initial is greater than 2.2.   2. Chronic systolic congestive heart failure: Continue with diuresis has minimal pulmonary edema but would not consider this acute exacerbation, continue aspirin and Lasix 3. Type 2 diabetes insulin requiring continue basal insulin at insulin sliding scale 4. Hyperlipidemia unspecified: Statin therapy 5. Venous thromboembolism prophylactic: Therapeutic Xarelto   All the records  are reviewed and case discussed with ED provider. Management plans discussed with the patient, family and they are in agreement.  CODE STATUS: Full code as discussed with family at bedside  TOTAL TIME TAKING CARE OF THIS PATIENT: 50 minutes.    Kosei Rhodes,  Mardi Mainland.D on 03/14/2015 at 10:43 PM  Between 7am to 6pm - Pager - [phone]  After 6pm: House Pager: - [phone]  [facility]  Office  [phone]  CC: Primary care physician; Pcp Not In System

## 2015-03-15 LAB — URINALYSIS COMPLETE WITH MICROSCOPIC (ARMC ONLY)
BILIRUBIN URINE: NEGATIVE
GLUCOSE, UA: 50 mg/dL — AB
KETONES UR: NEGATIVE mg/dL
Nitrite: NEGATIVE
Protein, ur: NEGATIVE mg/dL
SQUAMOUS EPITHELIAL / LPF: NONE SEEN
Specific Gravity, Urine: 1.011 (ref 1.005–1.030)
pH: 6 (ref 5.0–8.0)

## 2015-03-15 LAB — MRSA PCR SCREENING: MRSA by PCR: NEGATIVE

## 2015-03-15 LAB — CBC
HCT: 30.9 % — ABNORMAL LOW (ref 40.0–52.0)
Hemoglobin: 10.1 g/dL — ABNORMAL LOW (ref 13.0–18.0)
MCH: 30.1 pg (ref 26.0–34.0)
MCHC: 32.8 g/dL (ref 32.0–36.0)
MCV: 91.9 fL (ref 80.0–100.0)
PLATELETS: 262 10*3/uL (ref 150–440)
RBC: 3.36 MIL/uL — AB (ref 4.40–5.90)
RDW: 12.7 % (ref 11.5–14.5)
WBC: 16.4 10*3/uL — AB (ref 3.8–10.6)

## 2015-03-15 LAB — GLUCOSE, CAPILLARY
Glucose-Capillary: 197 mg/dL — ABNORMAL HIGH (ref 65–99)
Glucose-Capillary: 198 mg/dL — ABNORMAL HIGH (ref 65–99)
Glucose-Capillary: 258 mg/dL — ABNORMAL HIGH (ref 65–99)
Glucose-Capillary: 264 mg/dL — ABNORMAL HIGH (ref 65–99)
Glucose-Capillary: 308 mg/dL — ABNORMAL HIGH (ref 65–99)

## 2015-03-15 LAB — BASIC METABOLIC PANEL
Anion gap: 9 (ref 5–15)
BUN: 25 mg/dL — ABNORMAL HIGH (ref 6–20)
CALCIUM: 8.2 mg/dL — AB (ref 8.9–10.3)
CHLORIDE: 98 mmol/L — AB (ref 101–111)
CO2: 29 mmol/L (ref 22–32)
CREATININE: 1.43 mg/dL — AB (ref 0.61–1.24)
GFR, EST AFRICAN AMERICAN: 48 mL/min — AB (ref >60)
GFR, EST NON AFRICAN AMERICAN: 42 mL/min — AB (ref >60)
Glucose, Bld: 247 mg/dL — ABNORMAL HIGH (ref 65–99)
Potassium: 3.7 mmol/L (ref 3.5–5.1)
SODIUM: 136 mmol/L (ref 135–145)

## 2015-03-15 LAB — PROCALCITONIN: PROCALCITONIN: 0.33 ng/mL

## 2015-03-15 LAB — LACTIC ACID, PLASMA: LACTIC ACID, VENOUS: 2 mmol/L (ref 0.5–2.0)

## 2015-03-15 MED ORDER — PNEUMOCOCCAL VAC POLYVALENT 25 MCG/0.5ML IJ INJ: 0.5000 mL | INJECTION | INTRAMUSCULAR | Status: DC

## 2015-03-15 MED ORDER — DEXTROSE 5 % IV SOLN
500.0000 mg | INTRAVENOUS | Status: DC
  Administered 2015-03-15 – 2015-03-17 (×3): 500 mg via INTRAVENOUS
  Filled 2015-03-15 (×4): qty 500

## 2015-03-15 MED ORDER — METHYLPREDNISOLONE SODIUM SUCC 125 MG IJ SOLR
60.0000 mg | INTRAMUSCULAR | Status: DC
  Administered 2015-03-15 – 2015-03-16 (×2): 60 mg via INTRAVENOUS
  Filled 2015-03-15 (×2): qty 2

## 2015-03-15 MED ORDER — CETYLPYRIDINIUM CHLORIDE 0.05 % MT LIQD
7.0000 mL | Freq: Two times a day (BID) | OROMUCOSAL | Status: DC
  Administered 2015-03-16 – 2015-03-17 (×4): 7 mL via OROMUCOSAL

## 2015-03-15 MED ORDER — INFLUENZA VAC SPLIT QUAD 0.5 ML IM SUSY
0.5000 mL | PREFILLED_SYRINGE | INTRAMUSCULAR | Status: AC
  Administered 2015-03-17: 0.5 mL via INTRAMUSCULAR
  Filled 2015-03-15: qty 0.5

## 2015-03-15 MED ORDER — CEFTRIAXONE SODIUM 2 G IJ SOLR
2.0000 g | INTRAMUSCULAR | Status: DC
  Administered 2015-03-15 – 2015-03-17 (×3): 2 g via INTRAVENOUS
  Filled 2015-03-15 (×4): qty 2

## 2015-03-15 MED ORDER — INSULIN GLARGINE 100 UNIT/ML ~~LOC~~ SOLN
28.0000 [IU] | Freq: Every day | SUBCUTANEOUS | Status: DC
  Administered 2015-03-15 – 2015-03-16 (×2): 28 [IU] via SUBCUTANEOUS
  Filled 2015-03-15 (×2): qty 0.28

## 2015-03-15 MED ORDER — CYANOCOBALAMIN 500 MCG PO TABS
500.0000 ug | ORAL_TABLET | Freq: Every day | ORAL | Status: DC
  Administered 2015-03-15 – 2015-03-18 (×4): 500 ug via ORAL
  Filled 2015-03-15 (×4): qty 1

## 2015-03-15 MED ORDER — RIVAROXABAN 20 MG PO TABS
20.0000 mg | ORAL_TABLET | Freq: Every day | ORAL | Status: DC
  Administered 2015-03-15 – 2015-03-16 (×2): 20 mg via ORAL
  Filled 2015-03-15 (×2): qty 1

## 2015-03-15 MED ORDER — TRIAZOLAM 0.25 MG PO TABS
0.5000 mg | ORAL_TABLET | Freq: Every day | ORAL | Status: DC
  Administered 2015-03-15 – 2015-03-17 (×4): 0.5 mg via ORAL
  Filled 2015-03-15 (×4): qty 2

## 2015-03-15 MED ORDER — GUAIFENESIN ER 600 MG PO TB12
600.0000 mg | ORAL_TABLET | Freq: Two times a day (BID) | ORAL | Status: DC
  Administered 2015-03-15 – 2015-03-18 (×8): 600 mg via ORAL
  Filled 2015-03-15 (×8): qty 1

## 2015-03-15 MED ORDER — OXYCODONE-ACETAMINOPHEN 5-325 MG PO TABS
0.5000 | ORAL_TABLET | Freq: Every day | ORAL | Status: DC
  Administered 2015-03-15 – 2015-03-17 (×4): 0.5 via ORAL
  Filled 2015-03-15 (×4): qty 1

## 2015-03-15 MED ORDER — GABAPENTIN 300 MG PO CAPS
300.0000 mg | ORAL_CAPSULE | Freq: Three times a day (TID) | ORAL | Status: DC
  Administered 2015-03-15 – 2015-03-18 (×10): 300 mg via ORAL
  Filled 2015-03-15 (×10): qty 1

## 2015-03-15 MED ORDER — PRAVASTATIN SODIUM 20 MG PO TABS
20.0000 mg | ORAL_TABLET | Freq: Every day | ORAL | Status: DC
  Administered 2015-03-15 – 2015-03-17 (×4): 20 mg via ORAL
  Filled 2015-03-15 (×4): qty 1

## 2015-03-15 MED ORDER — FUROSEMIDE 40 MG PO TABS: 40.0000 mg | ORAL_TABLET | Freq: Every day | ORAL | Status: DC | PRN

## 2015-03-15 MED ORDER — NITROGLYCERIN 0.4 MG SL SUBL: 0.4000 mg | SUBLINGUAL_TABLET | SUBLINGUAL | Status: DC | PRN

## 2015-03-15 MED ORDER — TERAZOSIN HCL 2 MG PO CAPS
2.0000 mg | ORAL_CAPSULE | Freq: Every day | ORAL | Status: DC
  Administered 2015-03-15 – 2015-03-17 (×4): 2 mg via ORAL
  Filled 2015-03-15 (×6): qty 1

## 2015-03-15 MED ORDER — DOCUSATE SODIUM 100 MG PO CAPS
100.0000 mg | ORAL_CAPSULE | Freq: Two times a day (BID) | ORAL | Status: DC
  Administered 2015-03-15 – 2015-03-18 (×8): 100 mg via ORAL
  Filled 2015-03-15 (×8): qty 1

## 2015-03-15 MED ORDER — DM-GUAIFENESIN ER 30-600 MG PO TB12
1.0000 | ORAL_TABLET | Freq: Two times a day (BID) | ORAL | Status: DC
  Filled 2015-03-15 (×3): qty 1

## 2015-03-15 MED ORDER — FUROSEMIDE 10 MG/ML IJ SOLN
20.0000 mg | Freq: Two times a day (BID) | INTRAMUSCULAR | Status: AC
  Administered 2015-03-16 (×2): 20 mg via INTRAVENOUS
  Filled 2015-03-15 (×2): qty 2

## 2015-03-15 MED ORDER — ASPIRIN 325 MG PO TABS
325.0000 mg | ORAL_TABLET | Freq: Every day | ORAL | Status: DC
  Administered 2015-03-15 – 2015-03-18 (×4): 325 mg via ORAL
  Filled 2015-03-15 (×4): qty 1

## 2015-03-15 MED ORDER — POLYETHYLENE GLYCOL 3350 17 G PO PACK
17.0000 g | PACK | ORAL | Status: DC
  Administered 2015-03-15 – 2015-03-17 (×2): 17 g via ORAL
  Filled 2015-03-15 (×3): qty 1

## 2015-03-15 MED ORDER — CHLORHEXIDINE GLUCONATE 0.12 % MT SOLN
15.0000 mL | Freq: Two times a day (BID) | OROMUCOSAL | Status: DC
  Administered 2015-03-16 – 2015-03-17 (×4): 15 mL via OROMUCOSAL
  Filled 2015-03-15 (×4): qty 15

## 2015-03-15 MED ORDER — INSULIN ASPART 100 UNIT/ML ~~LOC~~ SOLN
0.0000 [IU] | Freq: Every day | SUBCUTANEOUS | Status: DC
  Administered 2015-03-15: 5 [IU] via SUBCUTANEOUS
  Administered 2015-03-15 – 2015-03-17 (×3): 3 [IU] via SUBCUTANEOUS
  Filled 2015-03-15: qty 3
  Filled 2015-03-15: qty 4
  Filled 2015-03-15 (×2): qty 3

## 2015-03-15 MED ORDER — INSULIN ASPART 100 UNIT/ML ~~LOC~~ SOLN
0.0000 [IU] | Freq: Three times a day (TID) | SUBCUTANEOUS | Status: DC
  Administered 2015-03-15: 5 [IU] via SUBCUTANEOUS
  Administered 2015-03-15 (×2): 2 [IU] via SUBCUTANEOUS
  Administered 2015-03-16 (×2): 5 [IU] via SUBCUTANEOUS
  Administered 2015-03-16: 1 [IU] via SUBCUTANEOUS
  Administered 2015-03-17: 2 [IU] via SUBCUTANEOUS
  Administered 2015-03-17 – 2015-03-18 (×3): 3 [IU] via SUBCUTANEOUS
  Administered 2015-03-18: 5 [IU] via SUBCUTANEOUS
  Filled 2015-03-15: qty 3
  Filled 2015-03-15: qty 5
  Filled 2015-03-15: qty 3
  Filled 2015-03-15: qty 2
  Filled 2015-03-15: qty 3
  Filled 2015-03-15: qty 1
  Filled 2015-03-15 (×2): qty 2
  Filled 2015-03-15 (×3): qty 5

## 2015-03-15 MED ORDER — FUROSEMIDE 10 MG/ML IJ SOLN
40.0000 mg | Freq: Once | INTRAMUSCULAR | Status: AC
  Administered 2015-03-15: 40 mg via INTRAVENOUS
  Filled 2015-03-15: qty 4

## 2015-03-15 NOTE — Progress Notes (Signed)
Audible wheezing noted with minimal activity.  Saturations 94% on 2LNC.  Hand held nebulizer given and tolerated.  Cough nonproductive afterwards.

## 2015-03-15 NOTE — Progress Notes (Signed)
Initial appointment was made at the [facility] on April 03, 2015 at 10:00am. Thank you.

## 2015-03-15 NOTE — Progress Notes (Signed)
[facility] [place] at [facility]   PATIENT NAME: Martin RosenthalWalter [name]    MR#:  [id number]  DATE OF BIRTH:  1921-08-10  SUBJECTIVE:  CHIEF COMPLAINT:  Shortness of breath Patient is short of breath while resting. Denies any chest pain. Received breathing treatments with no significant improvement  REVIEW OF SYSTEMS:  CONSTITUTIONAL: No fever, fatigue or weakness.  EYES: No blurred or double vision.  EARS, NOSE, AND THROAT: No tinnitus or ear pain.  RESPIRATORY: Reporting cough, shortness of breath and wheezing ,denies hemoptysis.  CARDIOVASCULAR: No chest pain, orthopnea, edema.  GASTROINTESTINAL: No nausea, vomiting, diarrhea or abdominal pain.  GENITOURINARY: No dysuria, hematuria.  ENDOCRINE: No polyuria, nocturia,  HEMATOLOGY: No anemia, easy bruising or bleeding SKIN: No rash or lesion. MUSCULOSKELETAL: No joint pain or arthritis.   NEUROLOGIC: No tingling, numbness, weakness.  PSYCHIATRY: No anxiety or depression.   DRUG ALLERGIES:  No Known Allergies  VITALS:  Blood pressure 111/78, pulse 84, temperature 98.5 F (36.9 C), temperature source Oral, resp. rate 34, height 6' (1.829 m), weight 107.956 kg (238 lb), SpO2 94 %.  PHYSICAL EXAMINATION:  GENERAL:  81 y.o.-year-old patient lying in the bed with no acute distress.  EYES: Pupils equal, round, reactive to light and accommodation. No scleral icterus. Extraocular muscles intact.  HEENT: Head atraumatic, normocephalic. Oropharynx and nasopharynx clear.  NECK:  Supple, no jugular venous distention. No thyroid enlargement, no tenderness.  LUNGS: Moderate air entry with shallow breathing and coarse breath sounds bilaterally, bilateral wheezing, with rales,rhonchi no crepitation. Some use of accessory muscles of respiration.  CARDIOVASCULAR: S1, S2 normal. No murmurs, rubs, or gallops.  ABDOMEN: Soft, nontender, nondistended. Bowel sounds present. No organomegaly or mass.  EXTREMITIES: No pedal edema,  cyanosis, or clubbing.  NEUROLOGIC: Cranial nerves II through XII are intact. Muscle strength 5/5 in all extremities. Sensation intact. Gait not checked.  PSYCHIATRIC: The patient is alert and oriented x 3.  SKIN: No obvious rash, lesion, or ulcer.    LABORATORY PANEL:   CBC  Recent Labs Lab 03/15/15 0424  WBC 16.4*  HGB 10.1*  HCT 30.9*  PLT 262   ------------------------------------------------------------------------------------------------------------------  Chemistries   Recent Labs Lab 03/10/15 2249  03/15/15 0424  NA 140  < > 136  K 3.7  < > 3.7  CL 104  < > 98*  CO2 28  < > 29  GLUCOSE 272*  < > 247*  BUN 30*  < > 25*  CREATININE 2.23*  < > 1.43*  CALCIUM 8.4*  < > 8.2*  AST 33  --   --   ALT 34  --   --   ALKPHOS 91  --   --   BILITOT 1.0  --   --   < > = values in this interval not displayed. ------------------------------------------------------------------------------------------------------------------  Cardiac Enzymes  Recent Labs Lab 03/14/15 1928  TROPONINI 0.03   ------------------------------------------------------------------------------------------------------------------  RADIOLOGY:  Dg Chest Portable 1 View  03/14/2015  CLINICAL DATA:  Pt presents to ED via EMS with shortness of breath, cough. Per EMS, nurse from facility wanted him to get a breathing treatment and Mucinex. Doctor from facility said he needed to be evaluated in the ED. EXAM: PORTABLE CHEST 1 VIEW COMPARISON:  03/10/2015 FINDINGS: Status post median sternotomy and CABG. Heart is enlarged. There are interstitial markings at the lung bases consistent with mild interstitial edema. More confluent opacity at the bases raises a question of superimposed infectious process, right greater than left. No pleural  effusions or overt alveolar edema. IMPRESSION: 1. Cardiomegaly and interstitial edema. 2. Cannot exclude infectious process at the lung bases. Electronically Signed   By:  Norva Pavlov M.D.   On: 03/14/2015 20:08    EKG:   Orders placed or performed during the hospital encounter of 03/14/15  . EKG 12-Lead  . EKG 12-Lead    ASSESSMENT AND PLAN:   81 year old Caucasian gentleman history of ischemic cardiomyopathy with ejection fraction of 25% who is presenting with shortness of breath.  1.Sepsis, meeting septic criteria by leukocytosis, tachycardia, tachypnea present on admission -. Source nearly acquired pneumonia with bronchoconstriction -Follow up Panculture. - Continue antibiotics  ceftriaxone/azithromycin and taper antibiotics when culture data returns. He has not received fluid bolus given pulmonary edema and systolic heart failure.  -Initial lactic acid was at 2.0  -Give small dose of steroids and nebulizer treatments for bronchoconstriction  2. Acute on Chronic systolic congestive heart failure: Ejection fraction 20% as per previous records, With  cardiac wheeze today  Continue with IV Lasix for diuresis as patient pulmonary edema and short of breath  3. Type 2 diabetes insulin requiring continue basal insulin at insulin sliding scale   4. Hyperlipidemia unspecified: Statin therapy   5. Venous thromboembolism prophylactic: Therapeutic Xarelto      All the records are reviewed and case discussed with Care Management/Social Workerr. Management plans discussed with the patient, family and they are in agreement.  CODE STATUS: Full code  TOTAL TIME TAKING CARE OF THIS PATIENT: .   POSSIBLE D/C IN 3-4 DAYS, DEPENDING ON CLINICAL CONDITION.   Ramonita Lab M.D on 03/15/2015 at 3:31 PM  Between 7am to 6pm - Pager - [phone] After 6pm go to [email or web address] - password [email or web address]  [facility]  Office  [phone]  CC: Primary care physician; Pcp Not In System

## 2015-03-15 NOTE — Progress Notes (Signed)
Notiified Dr. Anne HahnWillis of 11 beat run of [place] and of patient reporting tightness to chest. Chest tightness went away and VSS afterwards. No new orders.

## 2015-03-15 NOTE — Progress Notes (Signed)
Per Dr. Amado CoeGouru, go ahead and give IV lasix and solumedrol now.

## 2015-03-15 NOTE — Care Management (Signed)
[facility] from [facility].  Has been resident of facility for over one year.  He odes not ambulate.  Uses his electric wheelchair to get around and to the dining hall.  He has complete assistance with his bath.  Patient is currently on 02 and this is acute. Patient does not have to be assessed to return but if there is a significant decline in status, may need to be reassessed- keeping in mind that patient is not ambulatory at baseline.  [facility]  [facility] in [facility] with patient's insurance per [facility]ari [facility]  Referral to csw.

## 2015-03-15 NOTE — Progress Notes (Signed)
Alert and oriented with some memory impairment related to course of treatment. Patient has been educated multiple times on treatment plan today. Very hard of hearing. No complaints of pain. Only complaint is shortness of breath. Breathing treatments have been given by respiratory today. One time dose of lasix and new order of solumedrol given to help with wheezing as well. Patient is able to stand beside the bed and use urinal. Vitals are stable. Will continue to monitor.

## 2015-03-15 NOTE — Plan of Care (Signed)
Problem: Physical Regulation: Goal: Diagnostic test results will improve Outcome: Completed/Met Date Met:  03/15/15 Initial lactic acid 2

## 2015-03-16 LAB — CBC
HCT: 31 % — ABNORMAL LOW (ref 40.0–52.0)
HEMOGLOBIN: 10.2 g/dL — AB (ref 13.0–18.0)
MCH: 30 pg (ref 26.0–34.0)
MCHC: 32.9 g/dL (ref 32.0–36.0)
MCV: 91.3 fL (ref 80.0–100.0)
PLATELETS: 281 10*3/uL (ref 150–440)
RBC: 3.39 MIL/uL — AB (ref 4.40–5.90)
RDW: 13 % (ref 11.5–14.5)
WBC: 13.6 10*3/uL — AB (ref 3.8–10.6)

## 2015-03-16 LAB — GLUCOSE, CAPILLARY
GLUCOSE-CAPILLARY: 126 mg/dL — AB (ref 65–99)
Glucose-Capillary: 278 mg/dL — ABNORMAL HIGH (ref 65–99)

## 2015-03-16 LAB — BASIC METABOLIC PANEL
ANION GAP: 7 (ref 5–15)
BUN: 31 mg/dL — AB (ref 6–20)
CHLORIDE: 100 mmol/L — AB (ref 101–111)
CO2: 29 mmol/L (ref 22–32)
Calcium: 8.5 mg/dL — ABNORMAL LOW (ref 8.9–10.3)
Creatinine, Ser: 1.53 mg/dL — ABNORMAL HIGH (ref 0.61–1.24)
GFR calc Af Amer: 44 mL/min — ABNORMAL LOW (ref >60)
GFR calc non Af Amer: 38 mL/min — ABNORMAL LOW (ref >60)
GLUCOSE: 305 mg/dL — AB (ref 65–99)
POTASSIUM: 4.3 mmol/L (ref 3.5–5.1)
Sodium: 136 mmol/L (ref 135–145)

## 2015-03-16 MED ORDER — PREDNISONE 50 MG PO TABS
50.0000 mg | ORAL_TABLET | Freq: Every day | ORAL | Status: DC
  Administered 2015-03-17 – 2015-03-18 (×2): 50 mg via ORAL
  Filled 2015-03-16 (×2): qty 1

## 2015-03-16 MED ORDER — INSULIN ASPART 100 UNIT/ML ~~LOC~~ SOLN
3.0000 [IU] | Freq: Three times a day (TID) | SUBCUTANEOUS | Status: DC
  Administered 2015-03-16 – 2015-03-18 (×6): 3 [IU] via SUBCUTANEOUS
  Filled 2015-03-16 (×5): qty 3

## 2015-03-16 MED ORDER — INSULIN GLARGINE 100 UNIT/ML ~~LOC~~ SOLN
32.0000 [IU] | Freq: Every day | SUBCUTANEOUS | Status: DC
  Administered 2015-03-17 – 2015-03-18 (×2): 32 [IU] via SUBCUTANEOUS
  Filled 2015-03-16 (×2): qty 0.32

## 2015-03-16 MED ORDER — FUROSEMIDE 40 MG PO TABS
40.0000 mg | ORAL_TABLET | Freq: Every day | ORAL | Status: DC
  Administered 2015-03-17 – 2015-03-18 (×2): 40 mg via ORAL
  Filled 2015-03-16 (×2): qty 1

## 2015-03-16 NOTE — Progress Notes (Signed)
Inpatient Diabetes Program Recommendations  AACE/ADA: New Consensus Statement on Inpatient Glycemic Control (2015)  Target Ranges:  Prepandial:   less than 140 mg/dL      Peak postprandial:   less than 180 mg/dL (1-2 hours)      Critically ill patients:  140 - 180 mg/dL   Review of Glycemic Control  Results for Martin [name], [name] (MRN [id number]) as of 03/16/2015 09:55  Ref. Range 03/15/2015 00:12 03/15/2015 07:20 03/15/2015 10:51 03/15/2015 16:31 03/15/2015 21:32  Glucose-Capillary Latest Ref Range: 65-99 mg/dL 409264 (H) 811198 (H) 914258 (H) 197 (H) 308 (H)   Diabetes history: Type 2 Outpatient Diabetes medications: Lantus 28 units tid,  Humalog 8 units tid Current orders for Inpatient glycemic control: Lantus 28 units qhs, Novolog 0-9 units tid, Novolog 0-5 units qhs,   Inpatient Diabetes Program Recommendations: Fasting 267 mg/dl this am.  Please consider increasing Lantus to 32 units qhs (0.3units/kg) qhs as fasting blood sugar remains elevated.  Consider adding Novolog 3 units tid with meals while he is on steroids - continue Novolog correction insulin.  Please decrease insulin as steroids are tapered.   Susette RacerJulie Reva Pinkley, RN, BA, MHA, CDE Diabetes Coordinator Inpatient Diabetes Program  [phone] (Team Pager) [phone] [facility] Office) 03/16/2015 10:13 AM

## 2015-03-16 NOTE — Progress Notes (Signed)
[facility] Physicians - [place] at [facility]   PATIENT NAME: Martin [name]    MR#:  [id number]  DATE OF BIRTH:  12/02/1921  SUBJECTIVE:  CHIEF COMPLAINT:  Shortness of breath Patients short of breath is better. Received breathing treatments  REVIEW OF SYSTEMS:  CONSTITUTIONAL: No fever, fatigue or weakness.  EYES: No blurred or double vision.  EARS, NOSE, AND THROAT: No tinnitus or ear pain.  RESPIRATORY: Reporting  improvement in cough, shortness of breath and wheezing ,denies hemoptysis.  CARDIOVASCULAR: No chest pain, orthopnea, edema.  GASTROINTESTINAL: No nausea, vomiting, diarrhea or abdominal pain.  GENITOURINARY: No dysuria, hematuria.  ENDOCRINE: No polyuria, nocturia,  HEMATOLOGY: No anemia, easy bruising or bleeding SKIN: No rash or lesion. MUSCULOSKELETAL: No joint pain or arthritis.   NEUROLOGIC: No tingling, numbness, weakness.  PSYCHIATRY: No anxiety or depression.   DRUG ALLERGIES:  No Known Allergies  VITALS:  Blood pressure 125/74, pulse 69, temperature 97.6 F (36.4 C), temperature source Oral, resp. rate 17, height 6' (1.829 m), weight 107.956 kg (238 lb), SpO2 95 %.  PHYSICAL EXAMINATION:  GENERAL:  81 y.o.-year-old patient lying in the bed with no acute distress.  EYES: Pupils equal, round, reactive to light and accommodation. No scleral icterus. Extraocular muscles intact.  HEENT: Head atraumatic, normocephalic. Oropharynx and nasopharynx clear.  NECK:  Supple, no jugular venous distention. No thyroid enlargement, no tenderness.  LUNGS: Moderate air entry with  normal breathing and coarse breath sounds bilaterally, no  wheezing,  rales,rhonchi no crepitation. No  use of accessory muscles of respiration.  CARDIOVASCULAR: S1, S2 normal. No murmurs, rubs, or gallops.  ABDOMEN: Soft, nontender, nondistended. Bowel sounds present. No organomegaly or mass.  EXTREMITIES: No pedal edema, cyanosis, or clubbing.  NEUROLOGIC: Cranial nerves II  through XII are intact. Muscle strength 5/5 in all extremities. Sensation intact. Gait not checked.  PSYCHIATRIC: The patient is alert and oriented x 3.  SKIN: No obvious rash, lesion, or ulcer.    LABORATORY PANEL:   CBC  Recent Labs Lab 03/16/15 0430  WBC 13.6*  HGB 10.2*  HCT 31.0*  PLT 281   ------------------------------------------------------------------------------------------------------------------  Chemistries   Recent Labs Lab 03/10/15 2249  03/16/15 0430  NA 140  < > 136  K 3.7  < > 4.3  CL 104  < > 100*  CO2 28  < > 29  GLUCOSE 272*  < > 305*  BUN 30*  < > 31*  CREATININE 2.23*  < > 1.53*  CALCIUM 8.4*  < > 8.5*  AST 33  --   --   ALT 34  --   --   ALKPHOS 91  --   --   BILITOT 1.0  --   --   < > = values in this interval not displayed. ------------------------------------------------------------------------------------------------------------------  Cardiac Enzymes  Recent Labs Lab 03/14/15 1928  TROPONINI 0.03   ------------------------------------------------------------------------------------------------------------------  RADIOLOGY:  Dg Chest Portable 1 View  03/14/2015  CLINICAL DATA:  Pt presents to ED via EMS with shortness of breath, cough. Per EMS, nurse from facility wanted him to get a breathing treatment and Mucinex. Doctor from facility said he needed to be evaluated in the ED. EXAM: PORTABLE CHEST 1 VIEW COMPARISON:  03/10/2015 FINDINGS: Status post median sternotomy and CABG. Heart is enlarged. There are interstitial markings at the lung bases consistent with mild interstitial edema. More confluent opacity at the bases raises a question of superimposed infectious process, right greater than left. No pleural effusions or overt  alveolar edema. IMPRESSION: 1. Cardiomegaly and interstitial edema. 2. Cannot exclude infectious process at the lung bases. Electronically Signed   By: Norva PavlovElizabeth  Brown M.D.   On: 03/14/2015 20:08    EKG:    Orders placed or performed during the hospital encounter of 03/14/15  . EKG 12-Lead  . EKG 12-Lead    ASSESSMENT AND PLAN:   81 year old Caucasian gentleman history of ischemic cardiomyopathy with ejection fraction of 25% who is presenting with shortness of breath.  1.Sepsis, meeting septic criteria by leukocytosis, tachycardia, tachypnea present on admission -. Source nearly acquired pneumonia with bronchoconstriction -Follow up Panculture. - Continue antibiotics  ceftriaxone/azithromycin and taper antibiotics when culture data returns. He has not received fluid bolus given pulmonary edema and systolic heart failure.  -Initial lactic acid was at 2.0  -Give small dose of steroids and nebulizer treatments for bronchoconstriction  2. Acute on Chronic systolic congestive heart failure: Ejection fraction 20% as per previous records  Continue with IV Lasix for diuresis  and change to by mouth Lasix in a.m. Monitor renal function closely   3. Type 2 diabetes insulin requiring continue basal insulin at insulin sliding scale   4. Hyperlipidemia unspecified: Statin therapy   5. Venous thromboembolism prophylactic: Therapeutic Xarelto      All the records are reviewed and case discussed with Care Management/Social Workerr. Management plans discussed with the patient, family and they are in agreement.  CODE STATUS: Full code  TOTAL TIME TAKING CARE OF THIS PATIENT: 35minutes.   POSSIBLE D/C IN 2-3 DAYS, DEPENDING ON CLINICAL CONDITION.   Ramonita LabGouru, Sheila Gervasi M.D on 03/16/2015 at 4:45 PM  Between 7am to 6pm - Pager - [phone] After 6pm go to [email or web address] - password [email or web address]  [facility]  Office  [phone]  CC: Primary care physician; Pcp Not In System

## 2015-03-16 NOTE — Care Management (Signed)
[facility]  Has an appointment at Heart Failure clinic schedule.  He appears short of breath at rest with conversation.  Discussed need to assess for need of homme 02 during progression.

## 2015-03-16 NOTE — Consult Note (Addendum)
   [facility] Consult   03/16/2015  [name] A Hipps 1921/07/29 [id number]   EP[facility] Care Management received. Left voicemail for inpatient RNCM to discuss further prior to attempting to engage patient or family for [facility] services.   Raiford NobleAtika Hall, MSN-Ed, RN,BSN [facility] Care Management Hospital Liaison [phone]

## 2015-03-16 NOTE — Care Management (Signed)
Spoke with Sherrilyn RistKari with [facility] and informed that they care not in network with patient's insurance.  She will double check and contact CM if this is any different

## 2015-03-16 NOTE — NC FL2 (Signed)
[place] MEDICAID FL2 LEVEL OF CARE SCREENING TOOL     IDENTIFICATION  Patient Name: Martin [name] A Kentner Birthdate: [birth year] Sex: male Admission Date (Current Location): 03/14/2015  [facility] [place]    Facility and Address:  [facility], [address], [place], [place] [address]      Provider Number: [id number]  Attending Physician Name and Address:  Ramonita LabAruna Trini Christiansen, MD  Relative Name and Phone Number:       Current Level of Care: SNF Recommended Level of Care: Assisted Living Facility Prior Approval Number:    Date Approved/Denied:   PASRR Number:    Discharge Plan: Domiciliary (Rest home)    Current Diagnoses: Patient Active Problem List   Diagnosis Date Noted  . Sepsis (HCC) 03/14/2015  . Community acquired pneumonia 03/14/2015  . Diabetes mellitus type 2 in obese (HCC) 10/01/2014  . Atrial fibrillation (HCC) 10/01/2014  . Chronic kidney disease, stage III (moderate) 10/01/2014  . Systolic congestive heart failure (HCC) 10/01/2014  . Peripheral vascular disease (HCC) 10/01/2014  . Coronary artery disease 10/01/2014    Orientation ACTIVITIES/SOCIAL BLADDER RESPIRATION    Self, Time, Situation, Place  Family supportive Continent O2 (As needed)  BEHAVIORAL SYMPTOMS/MOOD NEUROLOGICAL BOWEL NUTRITION STATUS      Continent Diet (no added salt)  PHYSICIAN VISITS COMMUNICATION OF NEEDS Height & Weight Skin  30 days Verbally   238 lbs. Normal          AMBULATORY STATUS RESPIRATION    Supervision limited O2 (As needed)      Personal Care Assistance Level of Assistance  Dressing, Bathing Bathing Assistance: Limited assistance   Dressing Assistance: Limited assistance      Functional Limitations Info  Hearing, Sight Sight Info: Impaired Hearing Info: Impaired         SPECIAL CARE FACTORS FREQUENCY  PT (By licensed PT)     PT Frequency:  (3x's week)             Additional Factors Info                  DISCHARGE MEDICATIONS:   Current Discharge Medication List    START taking these medications   Details  albuterol (PROVENTIL HFA;VENTOLIN HFA) 108 (90 BASE) MCG/ACT inhaler Inhale 2 puffs into the lungs every 6 (six) hours as needed for wheezing or shortness of breath. Qty: 1 Inhaler, Refills: 2    cefdinir (OMNICEF) 300 MG capsule Take 1 capsule (300 mg total) by mouth 2 (two) times daily. Qty: 14 capsule, Refills: 0    insulin aspart (NOVOLOG) 100 UNIT/ML injection Inject 3 Units into the skin 3 (three) times daily with meals. Qty: 10 mL, Refills: 11    ipratropium-albuterol (DUONEB) 0.5-2.5 (3) MG/3ML SOLN Take 3 mLs by nebulization every 6 (six) hours as needed. Qty: 360 mL, Refills: 0    predniSONE (STERAPRED UNI-PAK 21 TAB) 10 MG (21) TBPK tablet Take 1 tablet (10 mg total) by mouth daily. Take 6 tablets by mouth for 1 day followed by  5 tablets by mouth for 1 day followed by 4 tablets by mouth for 1 day followed by 3 tablets by mouth for 1 day followed by  2 tablets by mouth for 1 day followed by  1 tablet by mouth for a day and stop Qty: 21 tablet, Refills: 0      CONTINUE these medications which have CHANGED   Details  furosemide (LASIX) 40 MG tablet Take 1 tablet (40 mg total)  by mouth daily. Qty: 30 tablet, Refills: 0    insulin glargine (LANTUS) 100 UNIT/ML injection Inject 0.32 mLs (32 Units total) into the skin daily. Qty: 10 mL, Refills: 11    oxyCODONE-acetaminophen (PERCOCET/ROXICET) 5-325 MG tablet Take 0.5 tablets by mouth at bedtime. Pt also takes one-half tablet every six hours as needed for moderate/severe pain. Qty: 30 tablet, Refills: 0      CONTINUE these medications which have NOT CHANGED   Details  aspirin 325 MG tablet Take 325 mg by mouth daily.     dextromethorphan-guaiFENesin (MUCINEX DM) 30-600 MG 12hr tablet Take 1 tablet by mouth every 12 (twelve) hours.    dextrose (GLUTOSE) 40 %  GEL Take 1 Tube by mouth as needed for low blood sugar.    docusate sodium (COLACE) 100 MG capsule Take 100 mg by mouth 2 (two) times daily.    gabapentin (NEURONTIN) 300 MG capsule Take 300 mg by mouth 3 (three) times daily.    guaiFENesin-dextromethorphan (ROBITUSSIN DM) 100-10 MG/5ML syrup Take 15 mLs by mouth every 4 (four) hours as needed for cough.    nitroGLYCERIN (NITROSTAT) 0.4 MG SL tablet Place 1 tablet (0.4 mg total) under the tongue every 5 (five) minutes as needed for chest pain. Qty: 30 tablet, Refills: 1    polyethylene glycol (MIRALAX / GLYCOLAX) packet Take 17 g by mouth every Monday, Wednesday, and Friday.     pravastatin (PRAVACHOL) 20 MG tablet Take 20 mg by mouth at bedtime.     rivaroxaban (XARELTO) 20 MG TABS tablet Take 20 mg by mouth daily.    terazosin (HYTRIN) 2 MG capsule Take 2 mg by mouth at bedtime.     triazolam (HALCION) 0.25 MG tablet Take 0.5 mg by mouth at bedtime.     vitamin B-12 (CYANOCOBALAMIN) 500 MCG tablet Take 500 mcg by mouth daily.      STOP taking these medications     insulin lispro (HUMALOG) 100 UNIT/ML injection                  Do not use this list as official medication orders. Please verify with discharge summary.  Relevant Imaging Results:  Relevant Lab Results:  Recent Labs    Additional Information    Ned Card, LCSW

## 2015-03-17 ENCOUNTER — Other Ambulatory Visit: Payer: Self-pay | Admitting: *Deleted

## 2015-03-17 DIAGNOSIS — A419 Sepsis, unspecified organism: Secondary | ICD-10-CM | POA: Diagnosis not present

## 2015-03-17 LAB — BASIC METABOLIC PANEL
ANION GAP: 8 (ref 5–15)
BUN: 45 mg/dL — ABNORMAL HIGH (ref 6–20)
CALCIUM: 8.5 mg/dL — AB (ref 8.9–10.3)
CO2: 30 mmol/L (ref 22–32)
Chloride: 99 mmol/L — ABNORMAL LOW (ref 101–111)
Creatinine, Ser: 1.7 mg/dL — ABNORMAL HIGH (ref 0.61–1.24)
GFR, EST AFRICAN AMERICAN: 39 mL/min — AB (ref >60)
GFR, EST NON AFRICAN AMERICAN: 34 mL/min — AB (ref >60)
Glucose, Bld: 305 mg/dL — ABNORMAL HIGH (ref 65–99)
POTASSIUM: 4.3 mmol/L (ref 3.5–5.1)
SODIUM: 137 mmol/L (ref 135–145)

## 2015-03-17 LAB — GLUCOSE, CAPILLARY
GLUCOSE-CAPILLARY: 204 mg/dL — AB (ref 65–99)
GLUCOSE-CAPILLARY: 171 mg/dL — AB (ref 65–99)
Glucose-Capillary: 245 mg/dL — ABNORMAL HIGH (ref 65–99)
Glucose-Capillary: 285 mg/dL — ABNORMAL HIGH (ref 65–99)

## 2015-03-17 MED ORDER — RIVAROXABAN 15 MG PO TABS
15.0000 mg | ORAL_TABLET | Freq: Every day | ORAL | Status: DC
  Administered 2015-03-17: 15 mg via ORAL
  Filled 2015-03-17: qty 1

## 2015-03-17 MED ORDER — SODIUM CHLORIDE 0.9 % IJ SOLN: 3.0000 mL | INTRAMUSCULAR | Status: DC | PRN

## 2015-03-17 NOTE — Care Management Important Message (Signed)
Important Message  Patient Details  Name: [name] A Rubert MRN: [id number] Date of Birth: 04-03-1922   Medicare Important Message Given:  Yes    Eber HongGreene, Janani Chamber R, RN 03/17/2015, 9:05 AM

## 2015-03-17 NOTE — Progress Notes (Signed)
[facility] - [place] at [facility]   PATIENT NAME: Martin RosenthalWalter Kliewer    MR#:  [id number]  DATE OF BIRTH:  08/16/1921  SUBJECTIVE:  CHIEF COMPLAINT:  Shortness of breath Patients short of breath is improving. Becoming hypoxic with min exertion  REVIEW OF SYSTEMS:  CONSTITUTIONAL: No fever, fatigue or weakness.  EYES: No blurred or double vision.  EARS, NOSE, AND THROAT: No tinnitus or ear pain.  RESPIRATORY: Reporting  improvement in cough, shortness of breath and denies wheezing ,denies hemoptysis.  CARDIOVASCULAR: No chest pain, orthopnea, edema.  GASTROINTESTINAL: No nausea, vomiting, diarrhea or abdominal pain.  GENITOURINARY: No dysuria, hematuria.  ENDOCRINE: No polyuria, nocturia,  HEMATOLOGY: No anemia, easy bruising or bleeding SKIN: No rash or lesion. MUSCULOSKELETAL: No joint pain or arthritis.   NEUROLOGIC: No tingling, numbness, weakness.  PSYCHIATRY: No anxiety or depression.   DRUG ALLERGIES:  No Known Allergies  VITALS:  Blood pressure 116/62, pulse 72, temperature 98.4 F (36.9 C), temperature source Oral, resp. rate 20, height 6' (1.829 m), weight 107.956 kg (238 lb), SpO2 98 %.  PHYSICAL EXAMINATION:  GENERAL:  81 y.o.-year-old patient lying in the bed with no acute distress.  EYES: Pupils equal, round, reactive to light and accommodation. No scleral icterus. Extraocular muscles intact.  HEENT: Head atraumatic, normocephalic. Oropharynx and nasopharynx clear.  NECK:  Supple, no jugular venous distention. No thyroid enlargement, no tenderness.  LUNGS: Moderate air entry with  normal breathing and coarse breath sounds bilaterally, no  wheezing,  rales,rhonchi no crepitation. No  use of accessory muscles of respiration.  CARDIOVASCULAR: S1, S2 normal. No murmurs, rubs, or gallops.  ABDOMEN: Soft, nontender, nondistended. Bowel sounds present. No organomegaly or mass.  EXTREMITIES: No pedal edema, cyanosis, or clubbing.  NEUROLOGIC:  Cranial nerves II through XII are intact. Muscle strength 5/5 in all extremities. Sensation intact. Gait not checked.  PSYCHIATRIC: The patient is alert and oriented x 3.  SKIN: No obvious rash, lesion, or ulcer.    LABORATORY PANEL:   CBC  Recent Labs Lab 03/16/15 0430  WBC 13.6*  HGB 10.2*  HCT 31.0*  PLT 281   ------------------------------------------------------------------------------------------------------------------  Chemistries   Recent Labs Lab 03/10/15 2249  03/17/15 0431  NA 140  < > 137  K 3.7  < > 4.3  CL 104  < > 99*  CO2 28  < > 30  GLUCOSE 272*  < > 305*  BUN 30*  < > 45*  CREATININE 2.23*  < > 1.70*  CALCIUM 8.4*  < > 8.5*  AST 33  --   --   ALT 34  --   --   ALKPHOS 91  --   --   BILITOT 1.0  --   --   < > = values in this interval not displayed. ------------------------------------------------------------------------------------------------------------------  Cardiac Enzymes  Recent Labs Lab 03/14/15 1928  TROPONINI 0.03   ------------------------------------------------------------------------------------------------------------------  RADIOLOGY:  No results found.  EKG:   Orders placed or performed during the hospital encounter of 03/14/15  . EKG 12-Lead  . EKG 12-Lead    ASSESSMENT AND PLAN:   81 year old Caucasian gentleman history of ischemic cardiomyopathy with ejection fraction of 25% who is presenting with shortness of breath.  1.Sepsis, meeting septic criteria by leukocytosis, tachycardia, tachypnea present on admission -. Sepsis 2/2 pneumonia with bronchoconstriction -sputum culture with gm positive and gm neg rods and gm pos cocci in chains - Blood cx both bottles - NTD - Continue antibiotics  ceftriaxone/azithromycin and  taper antibiotics when culture data returns. He has not received fluid bolus given pulmonary edema and systolic heart failure.  -Initial lactic acid was at 2.0  -Give small dose of steroids and  nebulizer treatments for bronchoconstriction  2. Acute on Chronic systolic congestive heart failure: Ejection fraction 20% as per previous records  Discontinue  IV Lasix and started pt on po lasix  for diuresis Monitor renal function closely   3. Type 2 diabetes insulin requiring continue basal insulin at insulin sliding scale   4. Hyperlipidemia unspecified: Statin therapy   5. Venous thromboembolism prophylactic: Therapeutic Xarelto   6. Dispo- PT eval pending   All the records are reviewed and case discussed with Care Management/Social Workerr. Management plans discussed with the patient, family and they are in agreement.  CODE STATUS: Full code  TOTAL TIME TAKING CARE OF THIS PATIENT: .   POSSIBLE D/C IN 2-3 DAYS, DEPENDING ON CLINICAL CONDITION.   Ramonita Lab M.D on 03/17/2015 at 10:42 PM  Between 7am to 6pm - Pager - [phone] After 6pm go to [email or web address] - password [email or web address]  [facility]  Office  [phone]  CC: Primary care physician; Pcp Not In System

## 2015-03-17 NOTE — Evaluation (Signed)
Physical Therapy Evaluation Patient Details Name: Martin [name] A Grether MRN: [id number] DOB: [birth year] Today's Date: 03/17/2015   History of Present Illness  Pt is a 81 y.o. male presenting to hospital with SOB x1 week.  Pt admitted with sepsis d/t nearly acquired PNA with bronchoconstriction.  PMH includes systolic CHF with EF 25%.  Clinical Impression  Currently pt demonstrates impairments with activity tolerance, balance, and limitations with functional mobility.  Prior to admission, pt was ambulating short distances in his room with rollator but used motorized w/c for longer distances in facility (pt reports h/o knees buckling recently and feeling LE's are weaker).  Pt lives at the [facility] and had assist with bathing and meals.  Currently pt is modified independent with bed mobility and CGA with transfers and short ambulation distance in room with RW.  Pt desaturated from 93% O2 on RA at rest to 86% on RA with activity (nursing present and placed pt back on supplemental O2 for O2> 92%).  Pt appears to need O2 with activity.  Pt would benefit from skilled PT to address above noted impairments and functional limitations.  Recommend pt discharge back to ALF with HHPT when medically appropriate.     Follow Up Recommendations Home health PT    Equipment Recommendations   (pt has needed DME at ALF)    Recommendations for Other Services       Precautions / Restrictions Precautions Precautions: Fall Restrictions Weight Bearing Restrictions: No      Mobility  Bed Mobility Overal bed mobility: Modified Independent             General bed mobility comments: Supine to/from sit with HOB elevated  Transfers Overall transfer level: Needs assistance Equipment used: Rolling walker (2 wheeled) Transfers: Sit to/from Stand Sit to Stand: Min guard         General transfer comment: steady without loss of balance  Ambulation/Gait Ambulation/Gait assistance: Min guard Ambulation Distance  (Feet): 30 Feet Assistive device: Rolling walker (2 wheeled)   Gait velocity: decreased   General Gait Details: forward flexed posture; decreased B step length/foot clearance/heelstrike  Stairs            Wheelchair Mobility    Modified Rankin (Stroke Patients Only)       Balance Overall balance assessment: Needs assistance Sitting-balance support: Bilateral upper extremity supported;Feet supported Sitting balance-Leahy Scale: Good     Standing balance support: Bilateral upper extremity supported (on RW) Standing balance-Leahy Scale: Good                               Pertinent Vitals/Pain Pain Assessment: No/denies pain    Home Living Family/patient expects to be discharged to:: Assisted living ([facility] for past year)               Home Equipment: Environmental consultantWalker - 4 wheels;Wheelchair - power      Prior Function Level of Independence: Needs assistance   Gait / Transfers Assistance Needed: Independent with bed mobility, transfers, and short distance ambulating with rollator in room; uses electric w/c for longer distances  ADL's / Homemaking Assistance Needed: Assist for bathing, meals, cleaning        Hand Dominance        Extremity/Trunk Assessment   Upper Extremity Assessment: Overall WFL for tasks assessed           Lower Extremity Assessment: Overall WFL for tasks assessed  Communication   Communication: HOH  Cognition Arousal/Alertness: Awake/alert Behavior During Therapy: WFL for tasks assessed/performed Overall Cognitive Status: Within Functional Limits for tasks assessed                      General Comments   Nursing cleared pt for participation in physical therapy.  Pt agreeable to PT session.    Exercises        Assessment/Plan    PT Assessment Patient needs continued PT services  PT Diagnosis Difficulty walking   PT Problem List Decreased balance;Decreased mobility;Decreased activity tolerance   PT Treatment Interventions DME instruction;Gait training;Functional mobility training;Therapeutic activities;Therapeutic exercise;Balance training;Patient/family education   PT Goals (Current goals can be found in the Care Plan section) Acute Rehab PT Goals Patient Stated Goal: to go back to ALF PT Goal Formulation: With patient Time For Goal Achievement: 03/31/15 Potential to Achieve Goals: Good    Frequency Min 2X/week   Barriers to discharge        Co-evaluation               End of Session Equipment Utilized During Treatment: Gait belt;Oxygen Activity Tolerance: Patient tolerated treatment well Patient left: in bed;with call bell/phone within reach;with bed alarm set Nurse Communication: Mobility status (O2 desaturation with activity)         Time: 0981-1914 PT Time Calculation (min) (ACUTE ONLY): 20 min   Charges:   PT Evaluation $Initial PT Evaluation Tier I: 1 Procedure     PT G CodesHendricks Limes April 13, 2015, 1:23 PM Hendricks Limes, PT [phone]

## 2015-03-17 NOTE — Progress Notes (Signed)
Inpatient Diabetes Program Recommendations  AACE/ADA: New Consensus Statement on Inpatient Glycemic Control (2015)  Target Ranges:  Prepandial:   less than 140 mg/dL      Peak postprandial:   less than 180 mg/dL (1-2 hours)      Critically ill patients:  140 - 180 mg/dL   Review of Glycemic Control  Results for Martin [name], Martin [name] (MRN [id number]) as of 03/17/2015 10:42  Ref. Range 03/15/2015 16:31 03/15/2015 21:32 03/16/2015 11:23 03/16/2015 16:02 03/17/2015 07:41  Glucose-Capillary Latest Ref Range: 65-99 mg/dL 409197 (H) 811308 (H) 914278 (H) 126 (H) 204 (H)   Diabetes history: Type 2 Outpatient Diabetes medications: Lantus 28 units daily, Humalog 8 units tid Current orders for Inpatient glycemic control: Lantus 32 units qhs, Novolog 0-9 units tid, Novolog 0-5 units qhs, Novolog 3 units tid  Inpatient Diabetes Program Recommendations: Fasting 204 mg/dl this am.  Agree with current orders for diabetes medication management. Please decrease insulin as steroids are tapered.   Susette RacerJulie Leondra Cullin, RN, BA, MHA, CDE Diabetes Coordinator Inpatient Diabetes Program  [phone] (Team Pager) [phone] [facility] Office) 03/17/2015 10:47 AM

## 2015-03-17 NOTE — Patient Outreach (Signed)
[facility] Care Management  03/17/2015  [name] A Christiana [birth year] [id number]   Referral from Hospital Liaison to assign Community RN, assigned Jodi Mourningose Pierzchala, RN.  Thanks, Corrie MckusickLisa O. Sharlee BlewMoore, AABA [facility] Care Management [facility] CM Assistant Phone: [phone] Fax: [phone]

## 2015-03-17 NOTE — Care Management (Signed)
Patient has qualified for 02 and sats are documented.  Spoke with [name] and informed that patient will return with home 02 and home health SN and PT.  Informed that agency will be [facility]- all other requested agencies either can not staff nursing or is out of network.  Spoke with patient's daughter Archie Pattenonya to update on plan and that anticipate patient will discharge within next 48 hours.  Spoke with attending and requested order for home 02 and home health.  Referral called to [facility]

## 2015-03-17 NOTE — Progress Notes (Signed)
Pt had 4-bt vtach, asymptomatic, resting in room. MD rounding and made aware. Will continue to monitor.

## 2015-03-17 NOTE — Consult Note (Signed)
   [facility]   03/17/2015  [name] A Stammen 09-21-1921 [id number]    [facility]. Telephone call made to patient's daughter Lynnae Januaryonya Bowling at [phone]. Explained [facility] program. Mrs. Bowling indicates patient is [facility] [place] [address]. Mrs. Bowling agreeable to [facility] follow up while at ALF. She states Mr. Katrinka BlazingSmith is very hard of hearing and that he will not be able to hear of the phone. She also states patient cannot read. Asks that she be called for if anything needs to be signed and the like. Her best contact number is [phone]. Will request for patient to be assigned to [facility] RNCM for follow up and to assess at later time if [facility] CSW is needed.    Raiford NobleAtika Hall, MSN-Ed, RN,BSN [facility] Care Management Hospital Liaison [phone]

## 2015-03-17 NOTE — Progress Notes (Signed)
SATURATION QUALIFICATIONS: (This note is used to comply with regulatory documentation for home oxygen) ? ?Patient Saturations on Room Air at Rest = 93% ? ?Patient Saturations on Room Air while Ambulating = 86% ? ?Patient Saturations on 2 Liters of oxygen while Ambulating = 90% ? ?Please briefly explain why patient needs home oxygen: ?

## 2015-03-18 LAB — BASIC METABOLIC PANEL
Anion gap: 10 (ref 5–15)
BUN: 41 mg/dL — ABNORMAL HIGH (ref 6–20)
CHLORIDE: 97 mmol/L — AB (ref 101–111)
CO2: 29 mmol/L (ref 22–32)
CREATININE: 1.52 mg/dL — AB (ref 0.61–1.24)
Calcium: 8.3 mg/dL — ABNORMAL LOW (ref 8.9–10.3)
GFR, EST AFRICAN AMERICAN: 45 mL/min — AB (ref >60)
GFR, EST NON AFRICAN AMERICAN: 39 mL/min — AB (ref >60)
Glucose, Bld: 345 mg/dL — ABNORMAL HIGH (ref 65–99)
POTASSIUM: 3.7 mmol/L (ref 3.5–5.1)
SODIUM: 136 mmol/L (ref 135–145)

## 2015-03-18 LAB — EXPECTORATED SPUTUM ASSESSMENT W REFEX TO RESP CULTURE: SPECIAL REQUESTS: NORMAL

## 2015-03-18 LAB — CULTURE, RESPIRATORY W GRAM STAIN: Special Requests: NORMAL

## 2015-03-18 LAB — GLUCOSE, CAPILLARY
GLUCOSE-CAPILLARY: 297 mg/dL — AB (ref 65–99)
GLUCOSE-CAPILLARY: 181 mg/dL — AB (ref 65–99)

## 2015-03-18 LAB — EXPECTORATED SPUTUM ASSESSMENT W GRAM STAIN, RFLX TO RESP C

## 2015-03-18 LAB — CULTURE, RESPIRATORY

## 2015-03-18 MED ORDER — INSULIN ASPART 100 UNIT/ML ~~LOC~~ SOLN: 3.0000 [IU] | Freq: Three times a day (TID) | SUBCUTANEOUS | Status: DC

## 2015-03-18 MED ORDER — ALBUTEROL SULFATE HFA 108 (90 BASE) MCG/ACT IN AERS: 2.0000 | INHALATION_SPRAY | Freq: Four times a day (QID) | RESPIRATORY_TRACT | Status: AC | PRN

## 2015-03-18 MED ORDER — CEFDINIR 300 MG PO CAPS: 300.0000 mg | ORAL_CAPSULE | Freq: Two times a day (BID) | ORAL | Status: DC

## 2015-03-18 MED ORDER — IPRATROPIUM-ALBUTEROL 0.5-2.5 (3) MG/3ML IN SOLN: 3.0000 mL | Freq: Four times a day (QID) | RESPIRATORY_TRACT | Status: DC | PRN

## 2015-03-18 MED ORDER — POTASSIUM CHLORIDE 20 MEQ PO PACK: 20.0000 meq | PACK | ORAL | Status: DC

## 2015-03-18 MED ORDER — FUROSEMIDE 40 MG PO TABS: 40.0000 mg | ORAL_TABLET | Freq: Every day | ORAL | Status: DC

## 2015-03-18 MED ORDER — PREDNISONE 10 MG (21) PO TBPK: 10.0000 mg | ORAL_TABLET | Freq: Every day | ORAL | Status: DC

## 2015-03-18 MED ORDER — OXYCODONE-ACETAMINOPHEN 5-325 MG PO TABS: 0.5000 | ORAL_TABLET | Freq: Every day | ORAL | Status: AC

## 2015-03-18 MED ORDER — INSULIN GLARGINE 100 UNIT/ML ~~LOC~~ SOLN: 32.0000 [IU] | Freq: Every day | SUBCUTANEOUS | Status: DC

## 2015-03-18 NOTE — Clinical Social Work Note (Signed)
Late entry 03/18/15 for 03/17/15 Clinical Social Work Assessment  Patient Details  Name: Martin [name] MRN: [id number] Date of Birth: [birth year]  Date of referral:  03/16/15               Reason for consult:  Facility Placement                Permission sought to share information with:  Case Manager, Facility Medical sales representativeContact Representative, Family Supports Permission granted to share information::  Yes, Verbal Permission Granted  Name::     Archie Pattenonya [phone]   Agency::  [name]  Relationship::  Daughter  Contact Information:     Housing/Transportation Living arrangements for the past 2 months:  Assisted Living Facility ([facility]) Source of Information:  Patient, Medical Team, Sports coachCase Manager, Facility, Adult Children Patient Interpreter Needed:  None Criminal Activity/Legal Involvement Pertinent to Current Situation/Hospitalization:  No - Comment as needed Significant Relationships:  Adult Children, Merchandiser, retailCommunity Support Lives with:  Facility Resident Do you feel safe going back to the place where you live?  Yes Need for family participation in patient care:  Yes (Comment)  Care giving concerns:  Pt has lived at [facility] since Nov 2015   Social Worker assessment / plan:  CSW was referred to Pt to assist with dc planning. Pt is from [facility]F [facility] where he has lived for a year. Pt uses wheelchair at [name] but per his report is able to move around his room without one. Pt's daughter is very involved in Pt's care and is available to tx as needed. RN CM spoke with daughter regarding dc plans back to [name] with [facility] PT and RN following in addition to [facility] Care [facility] services. Pt will dc back to ALF when medically cleared with additional support. CSW spoke to the resident care coordinator at ALF and they are in agreement and aware of plan.   Employment status:  Retired Database administratornsurance information:  Managed Medicare PT Recommendations:  24 Hour Supervision, Home with Home  Health Information / Referral to community resources:     Patient/Family's Response to care:  Pt and daughter in agreement with plan.   Patient/Family's Understanding of and Emotional Response to Diagnosis, Current Treatment, and Prognosis:  Pt impulsive, as difficulty staying in bed and remembering that he is a fall risk.    Emotional Assessment Appearance:  Appears stated age Attitude/Demeanor/Rapport:    Affect (typically observed):  Accepting, Restless Orientation:  Oriented to Self, Oriented to Place, Oriented to Situation Alcohol / Substance use:  Never Used Psych involvement (Current and /or in the community):  No (Comment)  Discharge Needs  Concerns to be addressed:  Adjustment to Illness Readmission within the last 30 days:  No Current discharge risk:  Physical Impairment Barriers to Discharge:  Continued Medical Work up   Stryker Corporationara N Emrys Mckamie, LCSW 03/18/2015, 9:39 AM

## 2015-03-18 NOTE — Clinical Social Work Note (Signed)
Patient to discharge today to return to [facility]. Patient's nurse is to call report to [facility]. RN CM has arranged the home health and O2. Patient's daughter is here and will transport patient back to [facility] today. York SpanielMonica Karolyn Messing MSW,LCSW [phone]

## 2015-03-18 NOTE — Discharge Summary (Signed)
[facility] - [place] [facility]   PATIENT NAME: Martin [name]    MR#:  [id number]  DATE OF BIRTH:  1921-12-17  DATE OF ADMISSION:  03/14/2015 ADMITTING PHYSICIAN: Wyatt Haste, MD  DATE OF DISCHARGE:  03/18/2015  PRIMARY CARE PHYSICIAN: Pcp Not In System    ADMISSION DIAGNOSIS:  Shortness of breath [R06.02]  DISCHARGE DIAGNOSIS:  Principal Problem:   Sepsis (HCC) Active Problems:   Community acquired pneumonia chronic CHF  SECONDARY DIAGNOSIS:   Past Medical History  Diagnosis Date  . Coronary artery disease   . CHF (congestive heart failure) (HCC)     ischemic cardiomyopathy- EF 25%  . Hyperlipidemia   . Diabetes mellitus without complication (HCC)   . Atrial fibrillation (HCC)   . BPH (benign prostatic hyperplasia)   . Peripheral vascular disease (HCC)   . Chronic kidney disease   . Insomnia     HOSPITAL COURSE:   81 year old Caucasian gentleman history of ischemic cardiomyopathy with ejection fraction of 25% who is presenting with shortness of breath.  1.Sepsis, meeting septic criteria by leukocytosis, tachycardia, tachypnea present on admission -. Sepsis 2/2 pneumonia with bronchoconstriction -sputum culture with gm positive and gm neg rods and gm pos cocci in chains and Haemophilus influenza-we will discharge the patient with Omnicef. Primary care physician to follow-up on the final sputum culture result. Patient has received azithromycin during the hospital course - Blood cx both bottles - NTD -Treated with antibiotics ceftriaxone/azithromycin and taper antibiotics to Omnicef. He has not received fluid bolus given pulmonary edema and systolic heart failure.  -Initial lactic acid was at 2.0  -Give small dose of steroids and nebulizer treatments for bronchoconstriction  2. Acute on Chronic systolic congestive heart failure: Ejection fraction 20% as per previous records Discontinued IV Lasix and started pt on po lasix for  diuresis with potassium supplements Monitor renal function closely, seems to be at his baseline 1.4-1.5  Primary care physician to consider repeating BMP in 3-5 days Continue aspirin Outpatient follow-up with cardiology in a week  3. Type 2 diabetes insulin requiring continue basal insulin at insulin sliding scale   4. Hyperlipidemia unspecified: Statin therapy   5. Venous thromboembolism prophylactic: Therapeutic Xarelto  6. Dispo- PT  Is recommending home health PT  DISCHARGE CONDITIONS:  FAIR   CONSULTS OBTAINED:  Treatment Team:  Wyatt Haste, MD Ramonita Lab, MD   PROCEDURES NONE  DRUG ALLERGIES:  No Known Allergies  DISCHARGE MEDICATIONS:   Current Discharge Medication List    START taking these medications   Details  albuterol (PROVENTIL HFA;VENTOLIN HFA) 108 (90 BASE) MCG/ACT inhaler Inhale 2 puffs into the lungs every 6 (six) hours as needed for wheezing or shortness of breath. Qty: 1 Inhaler, Refills: 2    cefdinir (OMNICEF) 300 MG capsule Take 1 capsule (300 mg total) by mouth 2 (two) times daily. Qty: 14 capsule, Refills: 0    insulin aspart (NOVOLOG) 100 UNIT/ML injection Inject 3 Units into the skin 3 (three) times daily with meals. Qty: 10 mL, Refills: 11    ipratropium-albuterol (DUONEB) 0.5-2.5 (3) MG/3ML SOLN Take 3 mLs by nebulization every 6 (six) hours as needed. Qty: 360 mL, Refills: 0    predniSONE (STERAPRED UNI-PAK 21 TAB) 10 MG (21) TBPK tablet Take 1 tablet (10 mg total) by mouth daily. Take 6 tablets by mouth for 1 day followed by  5 tablets by mouth for 1 day followed by  4 tablets by mouth for 1 day  followed by  3 tablets by mouth for 1 day followed by  2 tablets by mouth for 1 day followed by  1 tablet by mouth for a day and stop Qty: 21 tablet, Refills: 0      CONTINUE these medications which have CHANGED   Details  furosemide (LASIX) 40 MG tablet Take 1 tablet (40 mg total) by mouth daily. Qty: 30 tablet, Refills: 0     insulin glargine (LANTUS) 100 UNIT/ML injection Inject 0.32 mLs (32 Units total) into the skin daily. Qty: 10 mL, Refills: 11    oxyCODONE-acetaminophen (PERCOCET/ROXICET) 5-325 MG tablet Take 0.5 tablets by mouth at bedtime. Pt also takes one-half tablet every six hours as needed for moderate/severe pain. Qty: 30 tablet, Refills: 0      CONTINUE these medications which have NOT CHANGED   Details  aspirin 325 MG tablet Take 325 mg by mouth daily.     dextromethorphan-guaiFENesin (MUCINEX DM) 30-600 MG 12hr tablet Take 1 tablet by mouth every 12 (twelve) hours.    dextrose (GLUTOSE) 40 % GEL Take 1 Tube by mouth as needed for low blood sugar.    docusate sodium (COLACE) 100 MG capsule Take 100 mg by mouth 2 (two) times daily.    gabapentin (NEURONTIN) 300 MG capsule Take 300 mg by mouth 3 (three) times daily.    guaiFENesin-dextromethorphan (ROBITUSSIN DM) 100-10 MG/5ML syrup Take 15 mLs by mouth every 4 (four) hours as needed for cough.    nitroGLYCERIN (NITROSTAT) 0.4 MG SL tablet Place 1 tablet (0.4 mg total) under the tongue every 5 (five) minutes as needed for chest pain. Qty: 30 tablet, Refills: 1    polyethylene glycol (MIRALAX / GLYCOLAX) packet Take 17 g by mouth every Monday, Wednesday, and Friday.     pravastatin (PRAVACHOL) 20 MG tablet Take 20 mg by mouth at bedtime.     rivaroxaban (XARELTO) 20 MG TABS tablet Take 20 mg by mouth daily.    terazosin (HYTRIN) 2 MG capsule Take 2 mg by mouth at bedtime.     triazolam (HALCION) 0.25 MG tablet Take 0.5 mg by mouth at bedtime.     vitamin B-12 (CYANOCOBALAMIN) 500 MCG tablet Take 500 mcg by mouth daily.      STOP taking these medications     insulin lispro (HUMALOG) 100 UNIT/ML injection          DISCHARGE INSTRUCTIONS:   Activity as recommended by PT  Die- healthy heart / diabetic  F/u with pcp in 5 days F/u with cardio in 1 week  Monitor weights on daily basis Monitor intake and output  DIET:  Cardiac  diet, diabetic diet  DISCHARGE CONDITION:  Fair  ACTIVITY:  Activity as tolerated, as per PT recommendations  OXYGEN:  Home Oxygen: Yes.     Oxygen Delivery: 2-3 liters/min via Patient connected to nasal cannula oxygen  DISCHARGE LOCATION:  nursing home   If you experience worsening of your admission symptoms, develop shortness of breath, life threatening emergency, suicidal or homicidal thoughts you must seek medical attention immediately by calling 911 or calling your MD immediately  if symptoms less severe.  You Must read complete instructions/literature along with all the possible adverse reactions/side effects for all the Medicines you take and that have been prescribed to you. Take any new Medicines after you have completely understood and accpet all the possible adverse reactions/side effects.   Please note  You were cared for by a hospitalist during your hospital stay. If you have  any questions about your discharge medications or the care you received while you were in the hospital after you are discharged, you can call the unit and asked to speak with the hospitalist on call if the hospitalist that took care of you is not available. Once you are discharged, your primary care physician will handle any further medical issues. Please note that NO REFILLS for any discharge medications will be authorized once you are discharged, as it is imperative that you return to your primary care physician (or establish a relationship with a primary care physician if you do not have one) for your aftercare needs so that they can reassess your need for medications and monitor your lab values.     Today  Chief Complaint  Patient presents with  . Shortness of Breath   Patient is legally blind and very hard of hearing. Feels much better. Denies any chest pain or shortness of breath. Cough is significantly improved  ROS: CONSTITUTIONAL: Denies fevers, chills. Denies any fatigue, weakness.   EYES: Denies blurry vision, double vision, eye pain. EARS, NOSE, THROAT: Denies tinnitus, ear pain, hearing loss. RESPIRATORY: Denies cough, wheeze, shortness of breath.  CARDIOVASCULAR: Denies chest pain, palpitations, edema.  GASTROINTESTINAL: Denies nausea, vomiting, diarrhea, abdominal pain. Denies bright red blood per rectum. GENITOURINARY: Denies dysuria, hematuria. ENDOCRINE: Denies nocturia or thyroid problems. HEMATOLOGIC AND LYMPHATIC: Denies easy bruising or bleeding. SKIN: Denies rash or lesion. MUSCULOSKELETAL: Denies pain in neck, back, shoulder, knees, hips or arthritic symptoms.  NEUROLOGIC: Denies paralysis, paresthesias.  PSYCHIATRIC: Denies anxiety or depressive symptoms.   VITAL SIGNS:  Blood pressure 122/64, pulse 44, temperature 97.7 F (36.5 C), temperature source Oral, resp. rate 16, height 6' (1.829 m), weight 107.956 kg (238 lb), SpO2 97 %.  I/O:   Intake/Output Summary (Last 24 hours) at 03/18/15 1245 Last data filed at 03/18/15 1234  Gross per 24 hour  Intake    360 ml  Output   1800 ml  Net  -1440 ml    PHYSICAL EXAMINATION:  GENERAL:  81 y.o.-year-old patient lying in the bed with no acute distress.  EYES: Pupils equal, round, reactive to light and accommodation. No scleral icterus. Extraocular muscles intact.  HEENT: Head atraumatic, normocephalic. Oropharynx and nasopharynx clear.  NECK:  Supple, no jugular venous distention. No thyroid enlargement, no tenderness.  LUNGS: Normal breath sounds bilaterally, minimal wheezing, but clearing up with cough, rales,rhonchi or crepitation. No use of accessory muscles of respiration.  CARDIOVASCULAR: S1, S2 normal. No murmurs, rubs, or gallops.  ABDOMEN: Soft, non-tender, non-distended. Bowel sounds present. No organomegaly or mass.  EXTREMITIES: No pedal edema, cyanosis, or clubbing.  NEUROLOGIC: Cranial nerves II through XII are intact. Muscle strength at his baseline. Sensation intact. Gait not checked.   PSYCHIATRIC: The patient is alert and oriented x 3.  SKIN: No obvious rash, lesion, or ulcer.   DATA REVIEW:   CBC  Recent Labs Lab 03/16/15 0430  WBC 13.6*  HGB 10.2*  HCT 31.0*  PLT 281    Chemistries   Recent Labs Lab 03/18/15 0517  NA 136  K 3.7  CL 97*  CO2 29  GLUCOSE 345*  BUN 41*  CREATININE 1.52*  CALCIUM 8.3*    Cardiac Enzymes  Recent Labs Lab 03/14/15 1928  TROPONINI 0.03    Microbiology Results  Results for orders placed or performed during the hospital encounter of 03/14/15  Blood culture (routine x 2)     Status: None (Preliminary result)   Collection Time:  03/14/15  9:41 PM  Result Value Ref Range Status   Specimen Description BLOOD RIGHT ANTECUBITAL  Final   Special Requests   Final    BOTTLES DRAWN AEROBIC AND ANAEROBIC ,   Culture NO GROWTH 4 DAYS  Final   Report Status PENDING  Incomplete  Blood culture (routine x 2)     Status: None (Preliminary result)   Collection Time: 03/14/15  9:42 PM  Result Value Ref Range Status   Specimen Description BLOOD LEFT HAND  Final   Special Requests BOTTLES DRAWN AEROBIC AND ANAEROBIC  Final   Culture NO GROWTH 4 DAYS  Final   Report Status PENDING  Incomplete  Culture, expectorated sputum-assessment     Status: None   Collection Time: 03/14/15 11:59 PM  Result Value Ref Range Status   Specimen Description SPUTUM  Final   Special Requests Normal  Final   Sputum evaluation THIS SPECIMEN IS ACCEPTABLE FOR SPUTUM CULTURE  Final   Report Status 03/18/2015 FINAL  Final  Culture, respiratory (NON-Expectorated)     Status: None   Collection Time: 03/14/15 11:59 PM  Result Value Ref Range Status   Specimen Description SPUTUM  Final   Special Requests Normal Reflexed from [id number]  Final   Gram Stain   Final    EXCELLENT SPECIMEN - 90-100% WBCS MODERATE WBC SEEN FEW GRAM NEGATIVE RODS FEW GRAM POSITIVE RODS MODERATE GRAM POSITIVE COCCI IN CLUSTERS IN CHAINS    Culture    Final    HEAVY GROWTH HAEMOPHILUS INFLUENZAE BETA LACTAMASE NEGATIVE    Report Status 03/18/2015 FINAL  Final  MRSA PCR Screening     Status: None   Collection Time: 03/15/15  1:03 AM  Result Value Ref Range Status   MRSA by PCR NEGATIVE NEGATIVE Final    Comment:        The GeneXpert MRSA Assay (FDA approved for NASAL specimens only), is one component of a comprehensive MRSA colonization surveillance program. It is not intended to diagnose MRSA infection nor to guide or monitor treatment for MRSA infections.     RADIOLOGY:  Dg Chest Portable 1 View  03/14/2015  CLINICAL DATA:  Pt presents to ED via EMS with shortness of breath, cough. Per EMS, nurse from facility wanted him to get a breathing treatment and Mucinex. Doctor from facility said he needed to be evaluated in the ED. EXAM: PORTABLE CHEST 1 VIEW COMPARISON:  03/10/2015 FINDINGS: Status post median sternotomy and CABG. Heart is enlarged. There are interstitial markings at the lung bases consistent with mild interstitial edema. More confluent opacity at the bases raises a question of superimposed infectious process, right greater than left. No pleural effusions or overt alveolar edema. IMPRESSION: 1. Cardiomegaly and interstitial edema. 2. Cannot exclude infectious process at the lung bases. Electronically Signed   By: Norva Pavlov M.D.   On: 03/14/2015 20:08    EKG:   Orders placed or performed during the hospital encounter of 03/14/15  . EKG 12-Lead  . EKG 12-Lead      Management plans discussed with the patient, family and they are in agreement.  CODE STATUS:     Code Status Orders        Start     Ordered   03/14/15 2219  Full code   Continuous     03/14/15 2218      TOTAL TIME TAKING CARE OF THIS PATIENT: 45 minutes.    @  on 03/18/2015 at 12:45 PM  Between  7am to 6pm - Pager - [phone]  After 6pm go to [email or web address] - password [email or web address]  [facility]  Office   [phone]  CC: Primary care physician; Pcp Not In System

## 2015-03-18 NOTE — Discharge Instructions (Signed)
Heart Failure Clinic appointment on April 03, 2015 at 10:00am with Clarisa Kindredina Hackney, FNP. Please call [phone] to reschedule.   Activity as recommended by PT  Die- healthy heart / diabetic  F/u with pcp in 5 days F/u with cardio in 1 week

## 2015-03-18 NOTE — Care Management Note (Signed)
Case Management Note  Patient Details  Name: Martin [name] MRN: [id number] Date of Birth: [birth year]  Subjective/Objective:      [facility] will provide PT and RN services to Martin [name] who is being discharged to [facility] today. Referral called and faxed to [facility].              Action/Plan:   Expected Discharge Date:                  Expected Discharge Plan:     In-House Referral:     Discharge planning Services     Post Acute Care Choice:    Choice offered to:     DME Arranged:    DME Agency:     HH Arranged:    HH Agency:     Status of Service:     Medicare Important Message Given:  Yes Date Medicare IM Given:    Medicare IM give by:    Date Additional Medicare IM Given:    Additional Medicare Important Message give by:     If discussed at Long Length of Stay Meetings, dates discussed:    Additional Comments:  Martin Granda A, RN 03/18/2015, 11:47 AM

## 2015-03-19 LAB — CULTURE, BLOOD (ROUTINE X 2)
CULTURE: NO GROWTH
CULTURE: NO GROWTH

## 2015-03-20 ENCOUNTER — Other Ambulatory Visit: Payer: Self-pay | Admitting: *Deleted

## 2015-03-20 NOTE — Patient Outreach (Signed)
Transition of care call (discharge 11[facility]12[facility]  As requested, f/u with daughter Lynnae Januaryonya Bowling (pt hard of hearing, can't read), HIPPA verified on pt.  Daughter reports discharge papers were [place] [facility] (Assisted living facility), they manage his medications, home health was  Ordered post discharge.   Daughter states pt has an appointment to f/u with heart MD.   RN CM inquired of daughter the name of  pt's Primary Care MD to which she said is  Dr. Bennie Pieriniarro of [facility].  Daughter reports MD comes to the facility to see pt.    RN CM informed daughter in order for pt to  be eligible [facility], [facility].     Plan to inform [facility] case management assistant to close case- pt not  [facility] as primary care MD not with [facility].    Shayne Alkenose M.   Jenae Tomasello RN CCM [facility] Care Management  [phone]

## 2015-04-03 ENCOUNTER — Ambulatory Visit: Payer: Self-pay | Admitting: Family

## 2015-04-03 ENCOUNTER — Telehealth: Payer: Self-pay | Admitting: Family

## 2015-04-03 NOTE — Telephone Encounter (Signed)
Patient did not show for his initial appointment at the Heart Failure Clinic on 04/03/15. Will attempt to reschedule.

## 2015-04-21 ENCOUNTER — Encounter: Payer: Self-pay | Admitting: Family

## 2015-04-21 ENCOUNTER — Ambulatory Visit: Payer: Medicare Other | Attending: Family | Admitting: Family

## 2015-04-21 VITALS — BP 159/88 | HR 85 | Resp 20 | Ht 72.0 in | Wt 240.0 lb

## 2015-04-21 DIAGNOSIS — I255 Ischemic cardiomyopathy: Secondary | ICD-10-CM | POA: Insufficient documentation

## 2015-04-21 DIAGNOSIS — E785 Hyperlipidemia, unspecified: Secondary | ICD-10-CM | POA: Diagnosis not present

## 2015-04-21 DIAGNOSIS — N400 Enlarged prostate without lower urinary tract symptoms: Secondary | ICD-10-CM | POA: Diagnosis not present

## 2015-04-21 DIAGNOSIS — Z7901 Long term (current) use of anticoagulants: Secondary | ICD-10-CM | POA: Diagnosis not present

## 2015-04-21 DIAGNOSIS — I5022 Chronic systolic (congestive) heart failure: Secondary | ICD-10-CM | POA: Diagnosis not present

## 2015-04-21 DIAGNOSIS — Z79899 Other long term (current) drug therapy: Secondary | ICD-10-CM | POA: Diagnosis not present

## 2015-04-21 DIAGNOSIS — I129 Hypertensive chronic kidney disease with stage 1 through stage 4 chronic kidney disease, or unspecified chronic kidney disease: Secondary | ICD-10-CM | POA: Insufficient documentation

## 2015-04-21 DIAGNOSIS — E119 Type 2 diabetes mellitus without complications: Secondary | ICD-10-CM | POA: Insufficient documentation

## 2015-04-21 DIAGNOSIS — I4891 Unspecified atrial fibrillation: Secondary | ICD-10-CM | POA: Insufficient documentation

## 2015-04-21 DIAGNOSIS — I2510 Atherosclerotic heart disease of native coronary artery without angina pectoris: Secondary | ICD-10-CM | POA: Diagnosis not present

## 2015-04-21 DIAGNOSIS — R062 Wheezing: Secondary | ICD-10-CM | POA: Insufficient documentation

## 2015-04-21 DIAGNOSIS — Z7982 Long term (current) use of aspirin: Secondary | ICD-10-CM | POA: Diagnosis not present

## 2015-04-21 DIAGNOSIS — N189 Chronic kidney disease, unspecified: Secondary | ICD-10-CM | POA: Diagnosis not present

## 2015-04-21 DIAGNOSIS — Z951 Presence of aortocoronary bypass graft: Secondary | ICD-10-CM | POA: Insufficient documentation

## 2015-04-21 DIAGNOSIS — R0602 Shortness of breath: Secondary | ICD-10-CM | POA: Insufficient documentation

## 2015-04-21 DIAGNOSIS — Z794 Long term (current) use of insulin: Secondary | ICD-10-CM | POA: Insufficient documentation

## 2015-04-21 DIAGNOSIS — I739 Peripheral vascular disease, unspecified: Secondary | ICD-10-CM | POA: Diagnosis not present

## 2015-04-21 DIAGNOSIS — R140 Abdominal distension (gaseous): Secondary | ICD-10-CM | POA: Diagnosis not present

## 2015-04-21 DIAGNOSIS — I480 Paroxysmal atrial fibrillation: Secondary | ICD-10-CM

## 2015-04-21 NOTE — Patient Instructions (Signed)
Continue weighing daily and call for an overnight weight gain of > 2 pounds or a weekly weight gain of >5 pounds. 

## 2015-04-21 NOTE — Progress Notes (Signed)
Subjective:    Patient ID: Martin [name], male    DOB: 1921-08-24, 81 y.o.   MRN: [id number]  Congestive Heart Failure Presents for initial visit. The disease course has been stable. Associated symptoms include edema, fatigue and shortness of breath. Pertinent negatives include no abdominal pain, chest pain, chest pressure, orthopnea or palpitations. The symptoms have been stable. Past treatments include salt and fluid restriction. The treatment provided moderate relief. Compliance with prior treatments has been good. His past medical history is significant for CAD, DM and HTN. Compliance with total regimen is 76-100%.  Atrial Fibrillation Presents for initial visit. Symptoms include dizziness and shortness of breath. Symptoms are negative for bradycardia, chest pain, palpitations, tachycardia and weakness. The symptoms have been stable. Past treatments include anticoagulant and aspirin. Compliance with prior treatments has been good. Past medical history includes atrial fibrillation, CABG/stent, CAD, CHF, HTN and hyperlipidemia. There are no medication compliance problems.  Other This is a chronic (edema) problem. The current episode started more than 1 year ago. The problem occurs constantly. The problem has been unchanged. Associated symptoms include arthralgias (right knee pain) and fatigue. Pertinent negatives include no abdominal pain, chest pain, congestion, coughing, headaches, numbness, sore throat or weakness. The symptoms are aggravated by standing and walking. He has tried position changes for the symptoms. The treatment provided mild relief.    Past Medical History  Diagnosis Date  . Coronary artery disease   . CHF (congestive heart failure) (HCC)     ischemic cardiomyopathy- EF 25%  . Hyperlipidemia   . Diabetes mellitus without complication (HCC)   . Atrial fibrillation (HCC)   . BPH (benign prostatic hyperplasia)   . Peripheral vascular disease (HCC)   . Chronic kidney  disease   . Insomnia     Past Surgical History  Procedure Laterality Date  . Cardiac surgery    . Coronary artery bypass graft      Family History  Problem Relation Age of Onset  . Diabetes Other     Social History  Substance Use Topics  . Smoking status: Never Smoker   . Smokeless tobacco: Never Used  . Alcohol Use: No    No Known Allergies  Prior to Admission medications   Medication Sig Start Date End Date Taking? Authorizing Provider  albuterol (PROVENTIL HFA;VENTOLIN HFA) 108 (90 BASE) MCG/ACT inhaler Inhale 2 puffs into the lungs every 6 (six) hours as needed for wheezing or shortness of breath. 03/18/15  Yes Martin Lab, Martin [name]  aspirin 325 MG tablet Take 325 mg by mouth daily.    Yes Historical Provider, Martin [name]  dextromethorphan-guaiFENesin (MUCINEX DM) 30-600 MG 12hr tablet Take 1 tablet by mouth every 12 (twelve) hours.   Yes Historical Provider, Martin [name]  dextrose (GLUTOSE) 40 % GEL Take 1 Tube by mouth as needed for low blood sugar.   Yes Historical Provider, Martin [name]  docusate sodium (COLACE) 100 MG capsule Take 100 mg by mouth 2 (two) times daily.   Yes Historical Provider, Martin [name]  furosemide (LASIX) 40 MG tablet Take 1 tablet (40 mg total) by mouth daily. 03/18/15  Yes Martin Lab, Martin [name]  gabapentin (NEURONTIN) 300 MG capsule Take 300 mg by mouth 3 (three) times daily.   Yes Historical Provider, Martin [name]  guaiFENesin-dextromethorphan (ROBITUSSIN DM) 100-10 MG/5ML syrup Take 15 mLs by mouth every 4 (four) hours as needed for cough.   Yes Historical Provider, Martin [name]  insulin aspart (NOVOLOG) 100 UNIT/ML injection Inject 3 Units into the skin 3 (three) times daily with  meals. 03/18/15  Yes Martin LabAruna Gouru, Martin [name]  insulin glargine (LANTUS) 100 UNIT/ML injection Inject 0.32 mLs (32 Units total) into the skin daily. 03/18/15  Yes Martin LabAruna Gouru, Martin [name]  ipratropium-albuterol (DUONEB) 0.5-2.5 (3) MG/3ML SOLN Take 3 mLs by nebulization every 6 (six) hours as needed. 03/18/15  Yes Martin LabAruna Gouru, Martin [name]  nitroGLYCERIN (NITROSTAT)  0.4 MG SL tablet Place 1 tablet (0.4 mg total) under the tongue every 5 (five) minutes as needed for chest pain. 09/12/14  Yes Martin SirenVivek J Sainani, Martin [name]  oxyCODONE-acetaminophen (PERCOCET/ROXICET) 5-325 MG tablet Take 0.5 tablets by mouth at bedtime. Pt also takes one-half tablet every six hours as needed for moderate/severe pain. 03/18/15  Yes Martin LabAruna Gouru, Martin [name]  polyethylene glycol (MIRALAX / GLYCOLAX) packet Take 17 g by mouth every Monday, Wednesday, and Friday.    Yes Historical Provider, Martin [name]  pravastatin (PRAVACHOL) 20 MG tablet Take 20 mg by mouth at bedtime.    Yes Historical Provider, Martin [name]  rivaroxaban (XARELTO) 20 MG TABS tablet Take 20 mg by mouth daily.   Yes Historical Provider, Martin [name]  terazosin (HYTRIN) 2 MG capsule Take 2 mg by mouth at bedtime.    Yes Historical Provider, Martin [name]  triazolam (HALCION) 0.25 MG tablet Take 0.5 mg by mouth at bedtime.    Yes Historical Provider, Martin [name]  vitamin B-12 (CYANOCOBALAMIN) 500 MCG tablet Take 500 mcg by mouth daily.   Yes Historical Provider, Martin [name]     Review of Systems  Constitutional: Positive for fatigue. Negative for appetite change.  HENT: Positive for hearing loss. Negative for congestion, postnasal drip and sore throat.   Eyes: Negative.   Respiratory: Positive for shortness of breath. Negative for cough, chest tightness and wheezing.   Cardiovascular: Positive for leg swelling. Negative for chest pain and palpitations.  Gastrointestinal: Positive for abdominal distention. Negative for abdominal pain.  Endocrine: Negative.   Genitourinary: Negative.   Musculoskeletal: Positive for arthralgias (right knee pain). Negative for back pain.  Skin: Negative.   Allergic/Immunologic: Negative.   Neurological: Positive for dizziness. Negative for weakness, light-headedness, numbness and headaches.  Hematological: Negative for adenopathy. Does not bruise/bleed easily.  Psychiatric/Behavioral: Negative for sleep disturbance (sleeping on 1 pillow) and dysphoric mood. The  patient is not nervous/anxious.        Objective:   Physical Exam  Constitutional: He is oriented to person, place, and time. He appears well-developed and well-nourished.  HENT:  Head: Normocephalic and atraumatic.  Right Ear: Decreased hearing is noted.  Left Ear: Decreased hearing is noted.  Eyes: Conjunctivae are normal. Pupils are equal, round, and reactive to light.  Neck: Normal range of motion. Neck supple.  Cardiovascular: Normal rate and regular rhythm.   Pulmonary/Chest: Effort normal. He has wheezes in the right lower field and the left lower field. He has rales in the left lower field.  Abdominal: He exhibits distension. There is no tenderness.  Musculoskeletal: He exhibits edema (2+ pitting edema bilateral lower legs). He exhibits no tenderness.  Neurological: He is alert and oriented to person, place, and time.  Skin: Skin is warm and dry.  Psychiatric: He has a normal mood and affect. His behavior is normal. Thought content normal.  Nursing note and vitals reviewed.   BP 159/88 mmHg  Pulse 85  Resp 20  Ht 6' (1.829 m)  Wt 240 lb (108.863 kg)  BMI 32.54 kg/m2  SpO2 96%       Assessment & Plan:  1: Chronic heart failure with reduced ejection fraction- Patient presents with some fatigue  and shortness of breath upon exertion. He says that he does use a walker at [employer] or his electric scooter. No recent falls. He does get weighed and he was encouraged to have the staff call for an overnight weight gain of >2 pounds or a weekly weight gain of >5 pounds. He does not add salt to his food and the aide with him says that the food is cooked without using any salt. Encouraged him to continue not adding any salt to his food. Does have chronic edema in both of his legs but based on the edema in his legs, abdominal distention and few crackles, will increase his furosemide to  twice daily for today and tomorrow. Does have a weekly visit from the physician over at [facility].  Also wears a compression sock on his left leg with some relief. Has received his flu vaccine for this season. [facility] PharmD went in and reviewed medications with the patient. Not on an ACE-i or ARB due to renal disease. 2: Atrial fibrillation- Currently taking xarelto and aspirin. Currently in a regular rhythm. Will need to review his medical records to see if a beta blocker is needed.  3: Wheezing- Expiratory wheezing is noted in bilateral lower lobes. Does have an order for PRN albuterol inhaler and/or nebulizer. Order written for him to use the nebulizer upon his return to the facility.   Return here in 1 month or sooner for any questions/problems before then.

## 2015-05-25 ENCOUNTER — Ambulatory Visit: Payer: Self-pay | Admitting: Family

## 2015-06-20 ENCOUNTER — Emergency Department: Payer: Medicare Other

## 2015-06-20 ENCOUNTER — Inpatient Hospital Stay
Admission: EM | Admit: 2015-06-20 | Discharge: 2015-06-23 | DRG: 871 | Disposition: A | Discharge status: Home Health | Payer: Medicare Other | Attending: Internal Medicine | Admitting: Internal Medicine

## 2015-06-20 DIAGNOSIS — E785 Hyperlipidemia, unspecified: Secondary | ICD-10-CM | POA: Diagnosis present

## 2015-06-20 DIAGNOSIS — Z794 Long term (current) use of insulin: Secondary | ICD-10-CM

## 2015-06-20 DIAGNOSIS — Z7951 Long term (current) use of inhaled steroids: Secondary | ICD-10-CM

## 2015-06-20 DIAGNOSIS — J9601 Acute respiratory failure with hypoxia: Secondary | ICD-10-CM | POA: Diagnosis present

## 2015-06-20 DIAGNOSIS — Z7901 Long term (current) use of anticoagulants: Secondary | ICD-10-CM

## 2015-06-20 DIAGNOSIS — E669 Obesity, unspecified: Secondary | ICD-10-CM | POA: Diagnosis present

## 2015-06-20 DIAGNOSIS — Z7982 Long term (current) use of aspirin: Secondary | ICD-10-CM | POA: Diagnosis not present

## 2015-06-20 DIAGNOSIS — J189 Pneumonia, unspecified organism: Secondary | ICD-10-CM | POA: Diagnosis present

## 2015-06-20 DIAGNOSIS — A419 Sepsis, unspecified organism: Secondary | ICD-10-CM | POA: Diagnosis present

## 2015-06-20 DIAGNOSIS — N183 Chronic kidney disease, stage 3 (moderate): Secondary | ICD-10-CM | POA: Diagnosis present

## 2015-06-20 DIAGNOSIS — Z951 Presence of aortocoronary bypass graft: Secondary | ICD-10-CM | POA: Diagnosis not present

## 2015-06-20 DIAGNOSIS — I5022 Chronic systolic (congestive) heart failure: Secondary | ICD-10-CM | POA: Diagnosis present

## 2015-06-20 DIAGNOSIS — Z833 Family history of diabetes mellitus: Secondary | ICD-10-CM | POA: Diagnosis not present

## 2015-06-20 DIAGNOSIS — H9190 Unspecified hearing loss, unspecified ear: Secondary | ICD-10-CM | POA: Diagnosis present

## 2015-06-20 DIAGNOSIS — Z79899 Other long term (current) drug therapy: Secondary | ICD-10-CM

## 2015-06-20 DIAGNOSIS — I255 Ischemic cardiomyopathy: Secondary | ICD-10-CM | POA: Diagnosis present

## 2015-06-20 DIAGNOSIS — N400 Enlarged prostate without lower urinary tract symptoms: Secondary | ICD-10-CM | POA: Diagnosis present

## 2015-06-20 DIAGNOSIS — I4891 Unspecified atrial fibrillation: Secondary | ICD-10-CM | POA: Diagnosis present

## 2015-06-20 DIAGNOSIS — Z6832 Body mass index (BMI) 32.0-32.9, adult: Secondary | ICD-10-CM

## 2015-06-20 DIAGNOSIS — E1122 Type 2 diabetes mellitus with diabetic chronic kidney disease: Secondary | ICD-10-CM | POA: Diagnosis present

## 2015-06-20 DIAGNOSIS — I130 Hypertensive heart and chronic kidney disease with heart failure and stage 1 through stage 4 chronic kidney disease, or unspecified chronic kidney disease: Secondary | ICD-10-CM | POA: Diagnosis present

## 2015-06-20 DIAGNOSIS — Y95 Nosocomial condition: Secondary | ICD-10-CM | POA: Diagnosis present

## 2015-06-20 DIAGNOSIS — I739 Peripheral vascular disease, unspecified: Secondary | ICD-10-CM | POA: Diagnosis present

## 2015-06-20 DIAGNOSIS — Z79891 Long term (current) use of opiate analgesic: Secondary | ICD-10-CM | POA: Diagnosis not present

## 2015-06-20 DIAGNOSIS — I2510 Atherosclerotic heart disease of native coronary artery without angina pectoris: Secondary | ICD-10-CM | POA: Diagnosis present

## 2015-06-20 LAB — COMPREHENSIVE METABOLIC PANEL
ALK PHOS: 86 U/L (ref 38–126)
ALT: 19 U/L (ref 17–63)
ANION GAP: 8 (ref 5–15)
AST: 24 U/L (ref 15–41)
Albumin: 3.6 g/dL (ref 3.5–5.0)
BILIRUBIN TOTAL: 0.7 mg/dL (ref 0.3–1.2)
BUN: 29 mg/dL — ABNORMAL HIGH (ref 6–20)
CALCIUM: 8.3 mg/dL — AB (ref 8.9–10.3)
CO2: 27 mmol/L (ref 22–32)
CREATININE: 1.68 mg/dL — AB (ref 0.61–1.24)
Chloride: 99 mmol/L — ABNORMAL LOW (ref 101–111)
GFR, EST AFRICAN AMERICAN: 39 mL/min — AB (ref >60)
GFR, EST NON AFRICAN AMERICAN: 34 mL/min — AB (ref >60)
Glucose, Bld: 214 mg/dL — ABNORMAL HIGH (ref 65–99)
Potassium: 3.8 mmol/L (ref 3.5–5.1)
Sodium: 134 mmol/L — ABNORMAL LOW (ref 135–145)
TOTAL PROTEIN: 7.1 g/dL (ref 6.5–8.1)

## 2015-06-20 LAB — MRSA PCR SCREENING: MRSA by PCR: NEGATIVE

## 2015-06-20 LAB — CBC
HEMATOCRIT: 40.3 % (ref 40.0–52.0)
Hemoglobin: 12.6 g/dL — ABNORMAL LOW (ref 13.0–18.0)
MCH: 28.6 pg (ref 26.0–34.0)
MCHC: 31.4 g/dL — AB (ref 32.0–36.0)
MCV: 91.2 fL (ref 80.0–100.0)
Platelets: 259 10*3/uL (ref 150–440)
RBC: 4.42 MIL/uL (ref 4.40–5.90)
RDW: 14.2 % (ref 11.5–14.5)
WBC: 21.7 10*3/uL — ABNORMAL HIGH (ref 3.8–10.6)

## 2015-06-20 LAB — GLUCOSE, CAPILLARY
Glucose-Capillary: 187 mg/dL — ABNORMAL HIGH (ref 65–99)
Glucose-Capillary: 191 mg/dL — ABNORMAL HIGH (ref 65–99)
Glucose-Capillary: 333 mg/dL — ABNORMAL HIGH (ref 65–99)
Glucose-Capillary: 272 mg/dL — ABNORMAL HIGH (ref 65–99)

## 2015-06-20 LAB — BRAIN NATRIURETIC PEPTIDE: B NATRIURETIC PEPTIDE 5: 307 pg/mL — AB (ref 0.0–100.0)

## 2015-06-20 LAB — STREP PNEUMONIAE URINARY ANTIGEN: Strep Pneumo Urinary Antigen: NEGATIVE

## 2015-06-20 LAB — LACTIC ACID, PLASMA
LACTIC ACID, VENOUS: 2.2 mmol/L — AB (ref 0.5–2.0)
LACTIC ACID, VENOUS: 1.7 mmol/L (ref 0.5–2.0)

## 2015-06-20 LAB — TROPONIN I: TROPONIN I: 0.08 ng/mL — AB (ref <0.031)

## 2015-06-20 MED ORDER — ONDANSETRON HCL 4 MG/2ML IJ SOLN: 4.0000 mg | Freq: Four times a day (QID) | INTRAMUSCULAR | Status: DC | PRN

## 2015-06-20 MED ORDER — INSULIN ASPART 100 UNIT/ML ~~LOC~~ SOLN
0.0000 [IU] | Freq: Three times a day (TID) | SUBCUTANEOUS | Status: DC
  Administered 2015-06-20: 18:00:00 11 [IU] via SUBCUTANEOUS
  Administered 2015-06-21: 3 [IU] via SUBCUTANEOUS
  Administered 2015-06-21: 18:00:00 8 [IU] via SUBCUTANEOUS
  Administered 2015-06-21: 12:00:00 3 [IU] via SUBCUTANEOUS
  Administered 2015-06-22: 13:00:00 5 [IU] via SUBCUTANEOUS
  Administered 2015-06-22: 17:00:00 8 [IU] via SUBCUTANEOUS
  Administered 2015-06-22: 5 [IU] via SUBCUTANEOUS
  Administered 2015-06-23: 08:00:00 3 [IU] via SUBCUTANEOUS
  Administered 2015-06-23: 5 [IU] via SUBCUTANEOUS
  Filled 2015-06-20: qty 3
  Filled 2015-06-20 (×3): qty 5
  Filled 2015-06-20: qty 8
  Filled 2015-06-20: qty 5
  Filled 2015-06-20: qty 3
  Filled 2015-06-20: qty 8
  Filled 2015-06-20: qty 3
  Filled 2015-06-20: qty 11

## 2015-06-20 MED ORDER — ACETAMINOPHEN 650 MG RE SUPP: 650.0000 mg | Freq: Four times a day (QID) | RECTAL | Status: DC | PRN

## 2015-06-20 MED ORDER — DEXTROMETHORPHAN POLISTIREX ER 30 MG/5ML PO SUER
30.0000 mg | Freq: Two times a day (BID) | ORAL | Status: DC
  Administered 2015-06-20 – 2015-06-22 (×6): 30 mg via ORAL
  Filled 2015-06-20 (×10): qty 5

## 2015-06-20 MED ORDER — FLEET ENEMA 7-19 GM/118ML RE ENEM: 1.0000 | ENEMA | Freq: Once | RECTAL | Status: DC | PRN

## 2015-06-20 MED ORDER — DEXTROSE 5 % IV SOLN
500.0000 mg | INTRAVENOUS | Status: DC
  Administered 2015-06-20: 500 mg via INTRAVENOUS
  Filled 2015-06-20: qty 500

## 2015-06-20 MED ORDER — ONDANSETRON HCL 4 MG PO TABS: 4.0000 mg | ORAL_TABLET | Freq: Four times a day (QID) | ORAL | Status: DC | PRN

## 2015-06-20 MED ORDER — IOHEXOL 350 MG/ML SOLN
100.0000 mL | Freq: Once | INTRAVENOUS | Status: AC | PRN
  Administered 2015-06-20: 100 mL via INTRAVENOUS

## 2015-06-20 MED ORDER — ASPIRIN 325 MG PO TABS
325.0000 mg | ORAL_TABLET | Freq: Every day | ORAL | Status: DC
  Administered 2015-06-20 – 2015-06-23 (×4): 325 mg via ORAL
  Filled 2015-06-20 (×4): qty 1

## 2015-06-20 MED ORDER — FUROSEMIDE 40 MG PO TABS
40.0000 mg | ORAL_TABLET | Freq: Every day | ORAL | Status: DC
  Administered 2015-06-20 – 2015-06-23 (×4): 40 mg via ORAL
  Filled 2015-06-20 (×4): qty 1

## 2015-06-20 MED ORDER — DM-GUAIFENESIN ER 30-600 MG PO TB12: 1.0000 | ORAL_TABLET | Freq: Two times a day (BID) | ORAL | Status: DC

## 2015-06-20 MED ORDER — METHYLPREDNISOLONE SODIUM SUCC 125 MG IJ SOLR
60.0000 mg | INTRAMUSCULAR | Status: DC
  Administered 2015-06-20 – 2015-06-23 (×4): 60 mg via INTRAVENOUS
  Filled 2015-06-20 (×4): qty 2

## 2015-06-20 MED ORDER — GABAPENTIN 300 MG PO CAPS
300.0000 mg | ORAL_CAPSULE | Freq: Two times a day (BID) | ORAL | Status: DC
  Administered 2015-06-20 – 2015-06-23 (×6): 300 mg via ORAL
  Filled 2015-06-20 (×8): qty 1

## 2015-06-20 MED ORDER — GABAPENTIN 300 MG PO CAPS
600.0000 mg | ORAL_CAPSULE | Freq: Every day | ORAL | Status: DC
  Administered 2015-06-20 – 2015-06-22 (×3): 600 mg via ORAL
  Filled 2015-06-20: qty 2

## 2015-06-20 MED ORDER — ACETAMINOPHEN 325 MG PO TABS: 650.0000 mg | ORAL_TABLET | Freq: Four times a day (QID) | ORAL | Status: DC | PRN

## 2015-06-20 MED ORDER — TRIAZOLAM 0.25 MG PO TABS
0.5000 mg | ORAL_TABLET | Freq: Every day | ORAL | Status: DC
  Administered 2015-06-20 – 2015-06-22 (×3): 0.5 mg via ORAL
  Filled 2015-06-20 (×3): qty 2

## 2015-06-20 MED ORDER — IPRATROPIUM-ALBUTEROL 0.5-2.5 (3) MG/3ML IN SOLN
3.0000 mL | RESPIRATORY_TRACT | Status: DC | PRN
  Administered 2015-06-22 (×2): 3 mL via RESPIRATORY_TRACT
  Filled 2015-06-20 (×2): qty 3

## 2015-06-20 MED ORDER — NITROGLYCERIN 0.4 MG SL SUBL: 0.4000 mg | SUBLINGUAL_TABLET | SUBLINGUAL | Status: DC | PRN

## 2015-06-20 MED ORDER — ASPIRIN EC 81 MG PO TBEC: 81.0000 mg | DELAYED_RELEASE_TABLET | Freq: Every day | ORAL | Status: DC

## 2015-06-20 MED ORDER — BISACODYL 5 MG PO TBEC
5.0000 mg | DELAYED_RELEASE_TABLET | Freq: Every day | ORAL | Status: DC | PRN
  Filled 2015-06-20: qty 1

## 2015-06-20 MED ORDER — DEXTROSE 5 % IV SOLN
500.0000 mg | INTRAVENOUS | Status: DC
  Administered 2015-06-21 – 2015-06-22 (×2): 500 mg via INTRAVENOUS
  Filled 2015-06-20 (×3): qty 500

## 2015-06-20 MED ORDER — POLYETHYLENE GLYCOL 3350 17 G PO PACK
17.0000 g | PACK | ORAL | Status: DC
  Administered 2015-06-21 – 2015-06-23 (×2): 17 g via ORAL
  Filled 2015-06-20 (×3): qty 1

## 2015-06-20 MED ORDER — TERAZOSIN HCL 2 MG PO CAPS
2.0000 mg | ORAL_CAPSULE | Freq: Every day | ORAL | Status: DC
  Administered 2015-06-20 – 2015-06-22 (×3): 2 mg via ORAL
  Filled 2015-06-20 (×3): qty 1

## 2015-06-20 MED ORDER — DOCUSATE SODIUM 100 MG PO CAPS
100.0000 mg | ORAL_CAPSULE | Freq: Two times a day (BID) | ORAL | Status: DC
  Administered 2015-06-20 – 2015-06-23 (×7): 100 mg via ORAL
  Filled 2015-06-20 (×7): qty 1

## 2015-06-20 MED ORDER — GUAIFENESIN ER 600 MG PO TB12
600.0000 mg | ORAL_TABLET | Freq: Two times a day (BID) | ORAL | Status: DC
  Administered 2015-06-20 – 2015-06-22 (×5): 600 mg via ORAL
  Filled 2015-06-20 (×7): qty 1

## 2015-06-20 MED ORDER — OXYCODONE-ACETAMINOPHEN 5-325 MG PO TABS
0.5000 | ORAL_TABLET | Freq: Every day | ORAL | Status: DC
  Administered 2015-06-20 – 2015-06-22 (×3): 0.5 via ORAL
  Filled 2015-06-20 (×3): qty 1

## 2015-06-20 MED ORDER — RIVAROXABAN 20 MG PO TABS
20.0000 mg | ORAL_TABLET | Freq: Every day | ORAL | Status: DC
  Administered 2015-06-20 – 2015-06-22 (×3): 20 mg via ORAL
  Filled 2015-06-20 (×3): qty 1

## 2015-06-20 MED ORDER — DEXTROSE 5 % IV SOLN
1.0000 g | Freq: Once | INTRAVENOUS | Status: AC
  Administered 2015-06-20: 1 g via INTRAVENOUS
  Filled 2015-06-20: qty 10

## 2015-06-20 MED ORDER — POLYETHYLENE GLYCOL 3350 17 G PO PACK: 17.0000 g | PACK | Freq: Every day | ORAL | Status: DC | PRN

## 2015-06-20 MED ORDER — INSULIN GLARGINE 100 UNIT/ML ~~LOC~~ SOLN
34.0000 [IU] | Freq: Every day | SUBCUTANEOUS | Status: DC
  Administered 2015-06-20 – 2015-06-22 (×3): 34 [IU] via SUBCUTANEOUS
  Filled 2015-06-20 (×3): qty 0.34

## 2015-06-20 MED ORDER — PRAVASTATIN SODIUM 20 MG PO TABS
20.0000 mg | ORAL_TABLET | Freq: Every day | ORAL | Status: DC
  Administered 2015-06-20 – 2015-06-22 (×3): 20 mg via ORAL
  Filled 2015-06-20 (×2): qty 1

## 2015-06-20 MED ORDER — INSULIN ASPART 100 UNIT/ML ~~LOC~~ SOLN
0.0000 [IU] | Freq: Every day | SUBCUTANEOUS | Status: DC
  Administered 2015-06-20: 3 [IU] via SUBCUTANEOUS
  Administered 2015-06-21 – 2015-06-22 (×2): 5 [IU] via SUBCUTANEOUS
  Filled 2015-06-20: qty 5
  Filled 2015-06-20: qty 3

## 2015-06-20 NOTE — ED Notes (Signed)
Pt came to ED via EMS from the [facility]. Pt c/o right sided abdominal pain that started this morning when pt woke up. Pt satting 94% on 2L [place]. Pt not normally on oxygen. Per staff at the [facility], pt normally can dress himself but needed help this morning.

## 2015-06-20 NOTE — ED Notes (Signed)
Called report. Nurse unable to take report at this time. Will try calling back

## 2015-06-20 NOTE — ED Provider Notes (Signed)
[facility] Emergency Department Provider Note  ____________________________________________  Time seen: On arrival, via EMS  I have reviewed the triage vital signs and the nursing notes.   HISTORY  Chief Complaint Abdominal Pain  shortness of breath  Patient is somewhat of a poor historian and is hard of hearing  HPI Martin [name] is a 82 y.o. male who resides at the [facility]. EMS was called out today for shortness of breath and found that he was satting at 90%, they started nasal cannula oxygen. Patient reportedly has a history of CHF and diabetes atrial fibrillation and is on Xarelto. Now the patient is in the emergency department he is complaining of right-sided abdominal discomfort which he describes only as aching. Denies nausea vomiting or diarrhea.     Past Medical History  Diagnosis Date  . Coronary artery disease   . CHF (congestive heart failure) (HCC)     ischemic cardiomyopathy- EF 25%  . Hyperlipidemia   . Diabetes mellitus without complication (HCC)   . Atrial fibrillation (HCC)   . BPH (benign prostatic hyperplasia)   . Peripheral vascular disease (HCC)   . Chronic kidney disease   . Insomnia     Patient Active Problem List   Diagnosis Date Noted  . Sepsis (HCC) 03/14/2015  . Diabetes mellitus type 2 in obese (HCC) 10/01/2014  . Atrial fibrillation (HCC) 10/01/2014  . Chronic kidney disease, stage III (moderate) 10/01/2014  . Systolic congestive heart failure (HCC) 10/01/2014  . Peripheral vascular disease (HCC) 10/01/2014  . Coronary artery disease 10/01/2014    Past Surgical History  Procedure Laterality Date  . Cardiac surgery    . Coronary artery bypass graft      Current Outpatient Rx  Name  Route  Sig  Dispense  Refill  . albuterol (PROVENTIL HFA;VENTOLIN HFA) 108 (90 BASE) MCG/ACT inhaler   Inhalation   Inhale 2 puffs into the lungs every 6 (six) hours as needed for wheezing or shortness of breath.   1  Inhaler   2   . dextromethorphan-guaiFENesin (MUCINEX DM) 30-600 MG 12hr tablet   Oral   Take 1 tablet by mouth every 12 (twelve) hours.         Marland Kitchen dextrose (GLUTOSE) 40 % GEL   Oral   Take 1 Tube by mouth as needed for low blood sugar.         . docusate sodium (COLACE) 100 MG capsule   Oral   Take 100 mg by mouth 2 (two) times daily.         . furosemide (LASIX) 40 MG tablet   Oral   Take 1 tablet (40 mg total) by mouth daily. Patient taking differently: Take 40 mg by mouth daily. Take 1 tablet daily Monday-Friday and 1 tablet twice daily on Saturday and Sunday.   30 tablet   0   . gabapentin (NEURONTIN) 300 MG capsule   Oral   Take 300-600 mg by mouth 3 (three) times daily. Take 1 capsule at 0900 & 1400 and 2 capsules at 2000.         Marland Kitchen guaiFENesin-dextromethorphan (ROBITUSSIN DM) 100-10 MG/5ML syrup   Oral   Take 15 mLs by mouth every 4 (four) hours as needed for cough.         . insulin aspart (NOVOLOG) 100 UNIT/ML injection   Subcutaneous   Inject 3 Units into the skin 3 (three) times daily with meals.   10 mL   11   .  insulin glargine (LANTUS) 100 UNIT/ML injection   Subcutaneous   Inject 0.32 mLs (32 Units total) into the skin daily. Patient taking differently: Inject 34 Units into the skin daily.    10 mL   11   . ipratropium-albuterol (DUONEB) 0.5-2.5 (3) MG/3ML SOLN   Nebulization   Take 3 mLs by nebulization every 6 (six) hours as needed.   360 mL   0   . nitroGLYCERIN (NITROSTAT) 0.4 MG SL tablet   Sublingual   Place 1 tablet (0.4 mg total) under the tongue every 5 (five) minutes as needed for chest pain.   30 tablet   1   . oxyCODONE-acetaminophen (PERCOCET/ROXICET) 5-325 MG tablet   Oral   Take 0.5 tablets by mouth at bedtime. Pt also takes one-half tablet every six hours as needed for moderate/severe pain.   30 tablet   0   . polyethylene glycol (MIRALAX / GLYCOLAX) packet   Oral   Take 17 g by mouth every Monday, Wednesday, and  Friday.          . pravastatin (PRAVACHOL) 20 MG tablet   Oral   Take 20 mg by mouth at bedtime.          . rivaroxaban (XARELTO) 20 MG TABS tablet   Oral   Take 20 mg by mouth daily.         Marland Kitchen terazosin (HYTRIN) 2 MG capsule   Oral   Take 2 mg by mouth at bedtime.          . triazolam (HALCION) 0.25 MG tablet   Oral   Take 0.5 mg by mouth at bedtime.          . vitamin B-12 (CYANOCOBALAMIN) 500 MCG tablet   Oral   Take 500 mcg by mouth daily.         Marland Kitchen aspirin 325 MG tablet   Oral   Take 325 mg by mouth daily.            Allergies Review of patient's allergies indicates no known allergies.  Family History  Problem Relation Age of Onset  . Diabetes Other     Social History Social History  Substance Use Topics  . Smoking status: Never Smoker   . Smokeless tobacco: Never Used  . Alcohol Use: No    Review of Systems  Constitutional: Negative for fever. Eyes: Negative for visual changes. ENT: Negative for sore throat Cardiovascular: Negative for chest pain. Respiratory: Positive for shortness of breath. Gastrointestinal: Right-sided abdominal pain Genitourinary: Negative for hematuria Musculoskeletal: Negative for back pain. Skin: Negative for rash or bruising Neurological: Negative for headaches  Psychiatric: No anxiety    ____________________________________________   PHYSICAL EXAM:  VITAL SIGNS: ED Triage Vitals  Enc Vitals Group     BP 06/20/15 0738 152/90 mmHg     Pulse Rate 06/20/15 0738 104     Resp 06/20/15 0738 35     Temp 06/20/15 0738 98.9 F (37.2 C)     Temp Source 06/20/15 0738 Oral     SpO2 06/20/15 0738 98 %     Weight 06/20/15 0738 238 lb 15.7 oz (108.4 kg)     Height 06/20/15 0738 6' (1.829 m)     Head Cir --      Peak Flow --      Pain Score --      Pain Loc --      Pain Edu? --      Excl. in GC? --  Constitutional: Alert and oriented. No acute distress Eyes: Conjunctivae are normal.  ENT    Head: Normocephalic and atraumatic.   Mouth/Throat: Mucous membranes are moist. Cardiovascular: Tachycardia, regular rhythm. Normal and symmetric distal pulses are present in all extremities. No murmurs, rubs, or gallops. Respiratory: Tachypnea and increased work of breathing. Rales bilaterally at the bases  Gastrointestinal: Mild tenderness to palpation along the right flank. No distention. There is no CVA tenderness. Genitourinary: deferred Musculoskeletal: Nontender with normal range of motion in all extremities. No lower extremity tenderness nor edema. Neurologic:  Normal speech and language. No gross focal neurologic deficits are appreciated. Skin:  Skin is warm, dry and intact. No rash noted. Psychiatric: Mood and affect are normal. Patient exhibits appropriate insight and judgment.  ____________________________________________    LABS (pertinent positives/negatives)  Labs Reviewed  CBC - Abnormal; Notable for the following:    WBC 21.7 (*)    Hemoglobin 12.6 (*)    MCHC 31.4 (*)    All other components within normal limits  COMPREHENSIVE METABOLIC PANEL - Abnormal; Notable for the following:    Sodium 134 (*)    Chloride 99 (*)    Glucose, Bld 214 (*)    BUN 29 (*)    Creatinine, Ser 1.68 (*)    Calcium 8.3 (*)    GFR calc non Af Amer 34 (*)    GFR calc Af Amer 39 (*)    All other components within normal limits  TROPONIN I - Abnormal; Notable for the following:    Troponin I 0.08 (*)    All other components within normal limits  BRAIN NATRIURETIC PEPTIDE - Abnormal; Notable for the following:    B Natriuretic Peptide 307.0 (*)    All other components within normal limits  CULTURE, BLOOD (ROUTINE X 2)  CULTURE, BLOOD (ROUTINE X 2)  LACTIC ACID, PLASMA  LACTIC ACID, PLASMA    ____________________________________________   EKG  ED ECG REPORT I, Jene Every, the attending physician, personally viewed and interpreted this ECG.   Date: 06/20/2015  EKG  Time: 7:37 AM  Rate: 97  Rhythm: atrial fibrillation, rate 97  Axis: Normal  Intervals:left bundle branch block  ST&T Change: Nonspecific   ____________________________________________    RADIOLOGY I have personally reviewed any xrays that were ordered on this patient: Chest x-ray unremarkable  ____________________________________________   PROCEDURES  Procedure(s) performed: none  Critical Care performed: yes  CRITICAL CARE Performed by: Jene Every   Total critical care time: 30 minutes  Critical care time was exclusive of separately billable procedures and treating other patients.  Critical care was necessary to treat or prevent imminent or life-threatening deterioration.  Critical care was time spent personally by me on the following activities: development of treatment plan with patient and/or surrogate as well as nursing, discussions with consultants, evaluation of patient's response to treatment, examination of patient, obtaining history from patient or surrogate, ordering and performing treatments and interventions, ordering and review of laboratory studies, ordering and review of radiographic studies, pulse oximetry and re-evaluation of patient's condition.   ____________________________________________   INITIAL IMPRESSION / ASSESSMENT AND PLAN / ED COURSE  Pertinent labs & imaging results that were available during my care of the patient were reviewed by me and considered in my medical decision making (see chart for details).  Patient presents with relatively acute onset of shortness of breath with tachypnea and hypoxia requiring oxygen. He is on Xarelto for atrial fibrillation. He complains of right lower chest/right upper abdomen/flank pain  that appears to be severe. Given his age and comorbidities I will obtain CT scan given that chest x-ray is unremarkable although PE is unlikely but not impossible given his anticoagulation.  GFR noted, still feel CT  scan is required  ----------------------------------------- 11:18 AM on 06/20/2015 -----------------------------------------  CT scan shows multi lobar pneumonia, this is likely the cause of his shortness of breath and his right-sided chest pain. I'll start Rocephin and azithromycin, and blood cultures and obtain a lactic acid  ____________________________________________   FINAL CLINICAL IMPRESSION(S) / ED DIAGNOSES  Final diagnoses:  Healthcare-associated pneumonia     Jene Every, MD 06/20/15 1120

## 2015-06-20 NOTE — ED Notes (Signed)
Pt transported to CT scan.

## 2015-06-20 NOTE — ED Notes (Addendum)
2.2 Lactic acid. MD notified.

## 2015-06-20 NOTE — ED Notes (Addendum)
Daughter Kenney Houseman would like to be updated on status of pt.- [phone].

## 2015-06-20 NOTE — H&P (Signed)
[facility] - [place] [facility]   PATIENT NAME: Martin [name]    MR#:  [id number]  DATE OF BIRTH:  06-Aug-1921  DATE OF ADMISSION:  06/20/2015  PRIMARY CARE PHYSICIAN: Pcp Not In System   REQUESTING/REFERRING PHYSICIAN: Dr. Cyril Loosen  CHIEF COMPLAINT:   Chief Complaint  Patient presents with  . Abdominal Pain    HISTORY OF PRESENT ILLNESS:  [name]  is a 82 y.o. male with a known history of hypertension, diabetes, chronic systolic CHF presents to the hospital due to shortness of breath, hypoxia, right lower chest pain. Patient has had some mild wheezing for a few weeks which is being treated with Mucinex. He does not have any orthopnea or lower extremity edema. Patient woke up today morning at 5 AM to go to the bathroom using his walker and felt extremely weak and couldn't get out of bed. Later he was found to have saturations in the 80s on room air. Does not wear oxygen normally. Here in the emergency room a CT scan of the chest was done which shows multifocal bilateral pneumonia.  PAST MEDICAL HISTORY:   Past Medical History  Diagnosis Date  . Coronary artery disease   . CHF (congestive heart failure) (HCC)     ischemic cardiomyopathy- EF 25%  . Hyperlipidemia   . Diabetes mellitus without complication (HCC)   . Atrial fibrillation (HCC)   . BPH (benign prostatic hyperplasia)   . Peripheral vascular disease (HCC)   . Chronic kidney disease   . Insomnia     PAST SURGICAL HISTORY:   Past Surgical History  Procedure Laterality Date  . Cardiac surgery    . Coronary artery bypass graft      SOCIAL HISTORY:   Social History  Substance Use Topics  . Smoking status: Never Smoker   . Smokeless tobacco: Never Used  . Alcohol Use: No    FAMILY HISTORY:   Family History  Problem Relation Age of Onset  . Diabetes Other     DRUG ALLERGIES:  No Known Allergies  REVIEW OF SYSTEMS:   Review of Systems  Constitutional: Positive for  chills and malaise/fatigue. Negative for fever and weight loss.  HENT: Negative for hearing loss and nosebleeds.   Eyes: Negative for blurred vision, double vision and pain.  Respiratory: Positive for cough, shortness of breath and wheezing. Negative for hemoptysis and sputum production.   Cardiovascular: Positive for chest pain (right lower). Negative for palpitations, orthopnea and leg swelling.  Gastrointestinal: Negative for nausea, vomiting, abdominal pain, diarrhea and constipation.  Genitourinary: Negative for dysuria and hematuria.  Musculoskeletal: Negative for myalgias, back pain and falls.  Skin: Negative for rash.  Neurological: Positive for weakness. Negative for dizziness, tremors, sensory change, speech change, focal weakness, seizures and headaches.  Endo/Heme/Allergies: Does not bruise/bleed easily.  Psychiatric/Behavioral: Negative for depression and memory loss. The patient is not nervous/anxious.     MEDICATIONS AT HOME:   Prior to Admission medications   Medication Sig Start Date End Date Taking? Authorizing Provider  albuterol (PROVENTIL HFA;VENTOLIN HFA) 108 (90 BASE) MCG/ACT inhaler Inhale 2 puffs into the lungs every 6 (six) hours as needed for wheezing or shortness of breath. 03/18/15  Yes Ramonita Lab, MD  dextromethorphan-guaiFENesin (MUCINEX DM) 30-600 MG 12hr tablet Take 1 tablet by mouth every 12 (twelve) hours.   Yes Historical Provider, MD  dextrose (GLUTOSE) 40 % GEL Take 1 Tube by mouth as needed for low blood sugar.   Yes Historical Provider,  MD  docusate sodium (COLACE) 100 MG capsule Take 100 mg by mouth 2 (two) times daily.   Yes Historical Provider, MD  furosemide (LASIX) 40 MG tablet Take 1 tablet (40 mg total) by mouth daily. Patient taking differently: Take 40 mg by mouth daily. Take 1 tablet daily Monday-Friday and 1 tablet twice daily on Saturday and Sunday. 03/18/15  Yes Ramonita Lab, MD  gabapentin (NEURONTIN) 300 MG capsule Take 300-600 mg by  mouth 3 (three) times daily. Take 1 capsule at 0900 & 1400 and 2 capsules at 2000.   Yes Historical Provider, MD  guaiFENesin-dextromethorphan (ROBITUSSIN DM) 100-10 MG/5ML syrup Take 15 mLs by mouth every 4 (four) hours as needed for cough.   Yes Historical Provider, MD  insulin aspart (NOVOLOG) 100 UNIT/ML injection Inject 3 Units into the skin 3 (three) times daily with meals. 03/18/15  Yes Ramonita Lab, MD  insulin glargine (LANTUS) 100 UNIT/ML injection Inject 0.32 mLs (32 Units total) into the skin daily. Patient taking differently: Inject 34 Units into the skin daily.  03/18/15  Yes Ramonita Lab, MD  ipratropium-albuterol (DUONEB) 0.5-2.5 (3) MG/3ML SOLN Take 3 mLs by nebulization every 6 (six) hours as needed. 03/18/15  Yes Ramonita Lab, MD  nitroGLYCERIN (NITROSTAT) 0.4 MG SL tablet Place 1 tablet (0.4 mg total) under the tongue every 5 (five) minutes as needed for chest pain. 09/12/14  Yes Houston Siren, MD  oxyCODONE-acetaminophen (PERCOCET/ROXICET) 5-325 MG tablet Take 0.5 tablets by mouth at bedtime. Pt also takes one-half tablet every six hours as needed for moderate/severe pain. 03/18/15  Yes Ramonita Lab, MD  polyethylene glycol (MIRALAX / GLYCOLAX) packet Take 17 g by mouth every Monday, Wednesday, and Friday.    Yes Historical Provider, MD  pravastatin (PRAVACHOL) 20 MG tablet Take 20 mg by mouth at bedtime.    Yes Historical Provider, MD  rivaroxaban (XARELTO) 20 MG TABS tablet Take 20 mg by mouth daily.   Yes Historical Provider, MD  terazosin (HYTRIN) 2 MG capsule Take 2 mg by mouth at bedtime.    Yes Historical Provider, MD  triazolam (HALCION) 0.25 MG tablet Take 0.5 mg by mouth at bedtime.    Yes Historical Provider, MD  vitamin B-12 (CYANOCOBALAMIN) 500 MCG tablet Take 500 mcg by mouth daily.   Yes Historical Provider, MD  aspirin 325 MG tablet Take 325 mg by mouth daily.     Historical Provider, MD      VITAL SIGNS:  Blood pressure 143/96, pulse 85, temperature 98.9 F (37.2  C), temperature source Oral, resp. rate 30, height 6' (1.829 m), weight 108.4 kg (238 lb 15.7 oz), SpO2 94 %.  PHYSICAL EXAMINATION:  Physical Exam  GENERAL:  82 y.o.-year-old patient lying in the bed with mild respiratory distress. EYES: Pupils equal, round, reactive to light and accommodation. No scleral icterus. Extraocular muscles intact.  HEENT: Head atraumatic, normocephalic. Oropharynx and nasopharynx clear. No oropharyngeal erythema, moist oral mucosa  NECK:  Supple, no jugular venous distention. No thyroid enlargement, no tenderness.  LUNGS: Bilateral wheezing with increased work of breathing. CARDIOVASCULAR: S1, S2 normal. No murmurs, rubs, or gallops.  ABDOMEN: Soft, nontender, nondistended. Bowel sounds present. No organomegaly or mass.  EXTREMITIES: No pedal edema, cyanosis, or clubbing. + 2 pedal & radial pulses b/l.   NEUROLOGIC: Cranial nerves II through XII are intact. No focal Motor or sensory deficits appreciated b/l PSYCHIATRIC: The patient is alert and oriented x 3. Good affect.  SKIN: No obvious rash, lesion, or ulcer.  LABORATORY PANEL:   CBC  Recent Labs Lab 06/20/15 0745  WBC 21.7*  HGB 12.6*  HCT 40.3  PLT 259   ------------------------------------------------------------------------------------------------------------------  Chemistries   Recent Labs Lab 06/20/15 0745  NA 134*  K 3.8  CL 99*  CO2 27  GLUCOSE 214*  BUN 29*  CREATININE 1.68*  CALCIUM 8.3*  AST 24  ALT 19  ALKPHOS 86  BILITOT 0.7   ------------------------------------------------------------------------------------------------------------------  Cardiac Enzymes  Recent Labs Lab 06/20/15 0745  TROPONINI 0.08*   ------------------------------------------------------------------------------------------------------------------  RADIOLOGY:  Ct Angio Chest Pe W/cm &/or Wo Cm  06/20/2015  CLINICAL DATA:  Right-sided chest and abdominal pain EXAM: CT ANGIOGRAPHY CHEST  AND ABDOMEN TECHNIQUE: Multidetector CT imaging of the chest and abdomen was performed using the standard protocol during bolus administration of intravenous contrast. Multiplanar CT image reconstructions and MIPs were obtained to evaluate the vascular anatomy. CONTRAST:  OMNIPAQUE IOHEXOL 350 MG/ML SOLN COMPARISON:  10/13/2008. FINDINGS: CTA CHEST FINDINGS The lungs are well aerated bilaterally and demonstrate patchy infiltrative changes throughout the right lung as well as the left lower lobe. No sizable effusion is seen. No pneumothorax is noted. The thoracic inlet shows no focal abnormality. Aortic calcifications are seen without aneurysmal dilatation or dissection. Changes of prior bypass grafting are seen. The pulmonary artery is well visualized and shows no evidence of pulmonary emboli. Scattered mediastinal lymph nodes are noted which demonstrate normal fatty hila. Degenerative changes of the thoracic spine are noted. Changes of prior median sternotomy are seen. Review of the MIP images confirms the above findings. CTA ABDOMEN FINDINGS The liver, gallbladder, spleen, adrenal glands and pancreas are within normal limits with the exception of a small hypodensity measuring 18 mm in the tail of the pancreas. This lesion has enlarged slightly in the interval from 10/13/2008. The kidneys demonstrate a normal enhancement pattern. No calculi or obstructive changes are noted. The abdominal aorta demonstrates diffuse calcifications although no aneurysmal dilatation or dissection is seen. The visceral vessels are patent. Single renal artery is noted on the left. Dual renal artery on the right. Scattered atherosclerotic changes are noted without focal stenosis. Right common iliac and external iliac stenting is noted. Chronic compression deformity of L1 is noted. No other focal abnormality is seen. Review of the MIP images confirms the above findings. IMPRESSION: CTA of the chest: Patchy infiltrates throughout the  right lung and left lower lobe consistent with acute pneumonia. This may be related to the patient's clinical symptomatology. No evidence of pulmonary emboli. No definitive aortic abnormality is seen. CTA of the abdomen: No acute aortic abnormality is seen. Diffuse atherosclerotic changes are noted. Likely cyst in the tail of the pancreas which is increased from 10 to 18 mm in the interval from 2010. Electronically Signed   By: Alcide Clever M.D.   On: 06/20/2015 11:09   Ct Angio Abdomen W/cm &/or Wo Contrast  06/20/2015  CLINICAL DATA:  Right-sided chest and abdominal pain EXAM: CT ANGIOGRAPHY CHEST AND ABDOMEN TECHNIQUE: Multidetector CT imaging of the chest and abdomen was performed using the standard protocol during bolus administration of intravenous contrast. Multiplanar CT image reconstructions and MIPs were obtained to evaluate the vascular anatomy. CONTRAST:  OMNIPAQUE IOHEXOL 350 MG/ML SOLN COMPARISON:  10/13/2008. FINDINGS: CTA CHEST FINDINGS The lungs are well aerated bilaterally and demonstrate patchy infiltrative changes throughout the right lung as well as the left lower lobe. No sizable effusion is seen. No pneumothorax is noted. The thoracic inlet shows no focal abnormality. Aortic  calcifications are seen without aneurysmal dilatation or dissection. Changes of prior bypass grafting are seen. The pulmonary artery is well visualized and shows no evidence of pulmonary emboli. Scattered mediastinal lymph nodes are noted which demonstrate normal fatty hila. Degenerative changes of the thoracic spine are noted. Changes of prior median sternotomy are seen. Review of the MIP images confirms the above findings. CTA ABDOMEN FINDINGS The liver, gallbladder, spleen, adrenal glands and pancreas are within normal limits with the exception of a small hypodensity measuring 18 mm in the tail of the pancreas. This lesion has enlarged slightly in the interval from 10/13/2008. The kidneys demonstrate a normal  enhancement pattern. No calculi or obstructive changes are noted. The abdominal aorta demonstrates diffuse calcifications although no aneurysmal dilatation or dissection is seen. The visceral vessels are patent. Single renal artery is noted on the left. Dual renal artery on the right. Scattered atherosclerotic changes are noted without focal stenosis. Right common iliac and external iliac stenting is noted. Chronic compression deformity of L1 is noted. No other focal abnormality is seen. Review of the MIP images confirms the above findings. IMPRESSION: CTA of the chest: Patchy infiltrates throughout the right lung and left lower lobe consistent with acute pneumonia. This may be related to the patient's clinical symptomatology. No evidence of pulmonary emboli. No definitive aortic abnormality is seen. CTA of the abdomen: No acute aortic abnormality is seen. Diffuse atherosclerotic changes are noted. Likely cyst in the tail of the pancreas which is increased from 10 to 18 mm in the interval from 2010. Electronically Signed   By: Alcide Clever M.D.   On: 06/20/2015 11:09   Dg Chest Portable 1 View  06/20/2015  CLINICAL DATA:  Right-sided chest pain, initial encounter EXAM: PORTABLE CHEST 1 VIEW COMPARISON:  03/14/2015 FINDINGS: Cardiac shadow is again enlarged. Postsurgical changes are again seen. The lungs are well aerated with improved aeration in the bases bilaterally. No bony abnormality is seen. IMPRESSION: No acute abnormality noted. The overall appearance is improved from the prior study. Electronically Signed   By: Alcide Clever M.D.   On: 06/20/2015 08:01     IMPRESSION AND PLAN:   * Bilateral multifocal pneumonia with acute respiratory failure and sepsis -IV steroids, Antibiotics - Nebulizers -Wean O2 as tolerated - Consult pulmonary if no improvement - Sputum cx - urine antigens - Lactic acid pending  * Chronic Systolic CHF - EF 25% No signs of fluid overload  * pAfib Continue  medications  * CKD stage III is stable  * Diabetes mellitus, insulin-dependent Continue home medications along with sliding scale insulin.  * DVT prophylaxis. He is on Xarelto.   All the records are reviewed and case discussed with ED provider. Management plans discussed with the patient, family and they are in agreement.  CODE STATUS: FULL CODE  TOTAL TIME TAKING CARE OF THIS PATIENT: 45 minutes.    Milagros Loll R M.D on 06/20/2015 at 11:53 AM  Between 7am to 6pm - Pager - [phone]  After 6pm go to [email or web address] - password [email or web address]  [facility]  Office  [phone]  CC: Primary care physician; Pcp Not In System  Note: This dictation was prepared with Dragon dictation along with smaller phrase technology. Any transcriptional errors that result from this process are unintentional.

## 2015-06-20 NOTE — ED Notes (Signed)
Troponin 0.08. MD notified.

## 2015-06-21 LAB — EXPECTORATED SPUTUM ASSESSMENT W REFEX TO RESP CULTURE: SPECIAL REQUESTS: NORMAL

## 2015-06-21 LAB — GLUCOSE, CAPILLARY
GLUCOSE-CAPILLARY: 294 mg/dL — AB (ref 65–99)
GLUCOSE-CAPILLARY: 375 mg/dL — AB (ref 65–99)
Glucose-Capillary: 199 mg/dL — ABNORMAL HIGH (ref 65–99)
Glucose-Capillary: 153 mg/dL — ABNORMAL HIGH (ref 65–99)

## 2015-06-21 LAB — CBC
HCT: 36.5 % — ABNORMAL LOW (ref 40.0–52.0)
HEMOGLOBIN: 12 g/dL — AB (ref 13.0–18.0)
MCH: 29.2 pg (ref 26.0–34.0)
MCHC: 32.9 g/dL (ref 32.0–36.0)
MCV: 88.7 fL (ref 80.0–100.0)
Platelets: 228 10*3/uL (ref 150–440)
RBC: 4.12 MIL/uL — AB (ref 4.40–5.90)
RDW: 14.2 % (ref 11.5–14.5)
WBC: 15.7 10*3/uL — ABNORMAL HIGH (ref 3.8–10.6)

## 2015-06-21 LAB — BASIC METABOLIC PANEL
ANION GAP: 6 (ref 5–15)
BUN: 29 mg/dL — ABNORMAL HIGH (ref 6–20)
CHLORIDE: 105 mmol/L (ref 101–111)
CO2: 29 mmol/L (ref 22–32)
Calcium: 8.5 mg/dL — ABNORMAL LOW (ref 8.9–10.3)
Creatinine, Ser: 1.49 mg/dL — ABNORMAL HIGH (ref 0.61–1.24)
GFR calc non Af Amer: 39 mL/min — ABNORMAL LOW (ref >60)
GFR, EST AFRICAN AMERICAN: 45 mL/min — AB (ref >60)
GLUCOSE: 232 mg/dL — AB (ref 65–99)
Potassium: 4 mmol/L (ref 3.5–5.1)
Sodium: 140 mmol/L (ref 135–145)

## 2015-06-21 LAB — EXPECTORATED SPUTUM ASSESSMENT W GRAM STAIN, RFLX TO RESP C

## 2015-06-21 MED ORDER — GUAIFENESIN ER 600 MG PO TB12
600.0000 mg | ORAL_TABLET | Freq: Two times a day (BID) | ORAL | Status: DC
  Administered 2015-06-21 – 2015-06-23 (×4): 600 mg via ORAL
  Filled 2015-06-21 (×2): qty 1

## 2015-06-21 MED ORDER — CEFTRIAXONE SODIUM 1 G IJ SOLR
1.0000 g | INTRAMUSCULAR | Status: DC
  Administered 2015-06-21 – 2015-06-23 (×3): 1 g via INTRAVENOUS
  Filled 2015-06-21 (×3): qty 10

## 2015-06-21 NOTE — Clinical Social Work Note (Signed)
Clinical Social Work Assessment  Patient Details  Name: Martin [name] MRN: [id number] Date of Birth: [birth year]  Date of referral:  06/21/15               Reason for consult:  Facility Placement                Permission sought to share information with:  Family Supports Permission granted to share information::  Yes, Verbal Permission Granted  Name::     Martin [name], daughter   Housing/Transportation Living arrangements for the past 2 months:  Assisted Living Facility Source of Information:  Patient Patient Interpreter Needed:  None Criminal Activity/Legal Involvement Pertinent to Current Situation/Hospitalization:  No - Comment as needed Significant Relationships:  Adult Children Lives with:  Facility Resident Do you feel safe going back to the place where you live?  Yes Need for family participation in patient care:  Yes (Comment)  Care giving concerns:  No care giving concerns identified.   Social Worker assessment / plan:  CSW introduced herself and explained role of social work. CSW also explained process of discharging and returning to facility. Pt is agreeable to discharge plan. CSW also notified facility. Pt is able to return to facility. CSW will continue follow.   Employment status:  Retired Database administrator, [employer] In [place] PT Recommendations:  Not assessed at this time Information / Referral to community resources:  Other (Comment Required) (ALF)  Patient/Family's Response to care:  Pt was appreciative of CSW support.   Patient/Family's Understanding of and Emotional Response to Diagnosis, Current Treatment, and Prognosis:  Pt would like to return ALF.   Emotional Assessment Appearance:  Appears stated age Attitude/Demeanor/Rapport:  Other (Appropriate) Affect (typically observed):  Accepting Orientation:  Oriented to Self, Oriented to Place, Oriented to Situation Alcohol / Substance use:  Never Used Psych involvement (Current and /or in the  community):  No (Comment)  Discharge Needs  Concerns to be addressed:  Adjustment to Illness Readmission within the last 30 days:  No Current discharge risk:  Chronically ill Barriers to Discharge:  No Barriers Identified   Dede Query, LCSW 06/21/2015, 4:24 PM

## 2015-06-21 NOTE — Plan of Care (Signed)
Problem: Safety: Goal: Ability to remain free from injury will improve Outcome: Progressing P.t. Saw today  Up in chair most of day

## 2015-06-21 NOTE — Evaluation (Signed)
Physical Therapy Evaluation Patient Details Name: Martin [name] MRN: [id number] DOB: 1921/07/30 Today's Date: 06/21/2015   History of Present Illness  Patient is a 82 y/o male that presents with shortness of breath, found to have multi-lobar pneumonia. Patient lives at the [facility].   Clinical Impression  Patient typically ambulates with 2 or 4 WW from his bed to the bathroom and back per his report, all further distances are completed with power chair. Patient demonstrates roughly baseline level of strength and mobility, though mild deficit in bed mobility noted. Patient does not demonstrate any overt balance deficits, though he does demonstrate cardiopulmonary decline with use of O2 during ambulation, which he does not use at baseline. Patient appears to be roughly at his baseline physically, though he would benefit from HHPT to increase his cardiovascular tolerance for activity as well as bed mobility.     Follow Up Recommendations Home health PT    Equipment Recommendations       Recommendations for Other Services       Precautions / Restrictions Precautions Precautions: Fall Restrictions Weight Bearing Restrictions: No      Mobility  Bed Mobility Overal bed mobility: Needs Assistance Bed Mobility: Supine to Sit     Supine to sit: Min guard     General bed mobility comments: Patient requires very minimal assistance to elevate his trunk off the bed surface, prolonged time to complete transfer.   Transfers Overall transfer level: Needs assistance Equipment used: Rolling walker (2 wheeled) Transfers: Sit to/from Stand Sit to Stand: Min guard         General transfer comment: Patient demonstrates appropriate LE strength, no buckling in transfer. Smooth and efficient.   Ambulation/Gait Ambulation/Gait assistance: Min guard Ambulation Distance (Feet): 30 Feet Assistive device: Rolling walker (2 wheeled)     Gait velocity interpretation: at or above normal  speed for age/gender General Gait Details: Patient demonstrates reciprocal gait pattern, no loss of balance noted with swing through pattern. Appropriate floor clearance. Reports his heart doctor told him not to walk further than the door, thus he turned around at the door.   Stairs            Wheelchair Mobility    Modified Rankin (Stroke Patients Only)       Balance Overall balance assessment: Needs assistance Sitting-balance support: Feet supported;No upper extremity supported Sitting balance-Leahy Scale: Good     Standing balance support: Bilateral upper extremity supported Standing balance-Leahy Scale: Fair                               Pertinent Vitals/Pain Pain Assessment:  (Patient does not report any pain in this session)    Home Living Family/patient expects to be discharged to:: Assisted living               Home Equipment: Walker - 4 wheels;Wheelchair - power      Prior Function Level of Independence: Needs assistance   Gait / Transfers Assistance Needed: Independent with bed mobility, transfers, and short distance ambulating with rollator in room; uses electric w/c for longer distances  ADL's / Homemaking Assistance Needed: Assist for bathing, meals, cleaning        Hand Dominance        Extremity/Trunk Assessment   Upper Extremity Assessment: Overall WFL for tasks assessed           Lower Extremity Assessment: Overall WFL for tasks assessed  Communication   Communication: HOH  Cognition Arousal/Alertness: Awake/alert Behavior During Therapy: WFL for tasks assessed/performed Overall Cognitive Status: History of cognitive impairments - at baseline (Patient appears to have age appropriate cognitive deficits. )                      General Comments      Exercises        Assessment/Plan    PT Assessment Patient needs continued PT services  PT Diagnosis Difficulty walking;Generalized weakness    PT Problem List Decreased strength;Decreased mobility;Cardiopulmonary status limiting activity;Decreased activity tolerance  PT Treatment Interventions DME instruction;Gait training;Therapeutic activities;Therapeutic exercise;Balance training   PT Goals (Current goals can be found in the Care Plan section) Acute Rehab PT Goals Patient Stated Goal: To return home PT Goal Formulation: With patient Time For Goal Achievement: 07/05/15 Potential to Achieve Goals: Good    Frequency Min 2X/week   Barriers to discharge        Co-evaluation               End of Session Equipment Utilized During Treatment: Gait belt;Oxygen Activity Tolerance: Patient tolerated treatment well Patient left: in chair;with call bell/phone within reach;with chair alarm set Nurse Communication: Mobility status         Time: 9562-1308 PT Time Calculation (min) (ACUTE ONLY): 14 min   Charges:   PT Evaluation $PT Eval Moderate Complexity: 1 Procedure     PT G Codes:       Kerin Ransom, PT, DPT    06/21/2015, 7:15 PM

## 2015-06-21 NOTE — Progress Notes (Signed)
[facility] [place] at [facility]                                                                                                                                                                                            Patient Demographics   Martin [name], is a 82 y.o. male, DOB - Nov 03, 1921, ZOX:[id number]  Admit date - 06/20/2015   Admitting Physician Milagros Loll, MD  Outpatient Primary MD for the patient is Pcp Not In System   LOS - 1  Subjective: Patient reports his abdominal pain is resolved, still has cough and shortness of breath no chest pain     Review of Systems:   Review of Systems  Constitutional: Positive for chills and malaise/fatigue. Negative for fever and weight loss.  HENT: Negative for hearing loss and nosebleeds.  Eyes: Negative for blurred vision, double vision and pain.  Respiratory: Positive for cough, shortness of breath and wheezing. Negative for hemoptysis and sputum production.  Cardiovascular: Positive for chest pain (right lower). Negative for palpitations, orthopnea and leg swelling.  Gastrointestinal: Negative for nausea, vomiting, abdominal pain, diarrhea and constipation.  Genitourinary: Negative for dysuria and hematuria.  Musculoskeletal: Negative for myalgias, back pain and falls.  Skin: Negative for rash.  Neurological: Positive for weakness. Negative for dizziness, tremors, sensory change, speech change, focal weakness, seizures and headaches.  Endo/Heme/Allergies: Does not bruise/bleed easily.  Psychiatric/Behavioral: Negative for depression and memory loss. The patient is not nervous/anxious.     Vitals:   Filed Vitals:   06/20/15 2059 06/21/15 0427 06/21/15 0552 06/21/15 1122  BP: 147/75  133/73 127/73  Pulse: 80  68 61  Temp: 97.7 F (36.5 C)  97.5 F (36.4 C) 97.9 F (36.6 C)  TempSrc: Oral  Oral Oral  Resp: Height:      Weight:  106.414 kg (234 lb 9.6 oz)    SpO2: 99%  98% 100%     Wt Readings from Last 3 Encounters:  06/21/15 106.414 kg (234 lb 9.6 oz)  04/21/15 108.863 kg (240 lb)  03/14/15 107.956 kg (238 lb)     Intake/Output Summary (Last 24 hours) at 06/21/15 1301 Last data filed at 06/21/15 0900  Gross per 24 hour  Intake    480 ml  Output    600 ml  Net   -120 ml    Physical Exam:   GENERAL: 82 y.o.-year-old patient lying in the bed with mild respiratory distress. EYES: Pupils equal, round, reactive to light and accommodation. No scleral icterus. Extraocular muscles intact.  HEENT: Head atraumatic, normocephalic. Oropharynx and nasopharynx clear. No  oropharyngeal erythema, moist oral mucosa  NECK: Supple, no jugular venous distention. No thyroid enlargement, no tenderness.  LUNGS: Bilateral wheezing no accessory muscle use, no cracles or gallops CARDIOVASCULAR: S1, S2 normal. No murmurs, rubs, or gallops.  ABDOMEN: Soft, nontender, nondistended. Bowel sounds present. No organomegaly or mass.  EXTREMITIES: No pedal edema, cyanosis, or clubbing. + 2 pedal & radial pulses b/l.  NEUROLOGIC: Cranial nerves II through XII are intact. No focal Motor or sensory deficits appreciated b/l PSYCHIATRIC: The patient is alert and oriented x 3. Good affect.  SKIN: No obvious rash, lesion, or ulcer.   Antibiotics   Anti-infectives    Start     Dose/Rate Route Frequency Ordered Stop   06/21/15 1800  azithromycin (ZITHROMAX) 500 mg in dextrose 5 % 250 mL IVPB     500 mg 250 mL/hr over 60 Minutes Intravenous Every 24 hours 06/20/15 1622     06/21/15 1200  cefTRIAXone (ROCEPHIN) 1 g in dextrose 5 % 50 mL IVPB     1 g 100 mL/hr over 30 Minutes Intravenous Every 24 hours 06/21/15 1153     06/20/15 1200  azithromycin (ZITHROMAX) 500 mg in dextrose 5 % 250 mL IVPB  Status:  Discontinued     500 mg 250 mL/hr over 60 Minutes Intravenous Every 24 hours 06/20/15 1151 06/20/15 1622   06/20/15 1130  cefTRIAXone (ROCEPHIN) 1 g in dextrose 5 % 50 mL IVPB     1  g 100 mL/hr over 30 Minutes Intravenous  Once 06/20/15 1115 06/20/15 1204      Medications   Scheduled Meds: . aspirin  325 mg Oral Daily  . azithromycin  500 mg Intravenous Q24H  . cefTRIAXone (ROCEPHIN)  IV  1 g Intravenous Q24H  . dextromethorphan  30 mg Oral Q12H   And  . guaiFENesin  600 mg Oral Q12H  . docusate sodium  100 mg Oral BID  . furosemide  40 mg Oral Daily  . gabapentin  300 mg Oral BID  . gabapentin  600 mg Oral Daily  . insulin aspart  0-15 Units Subcutaneous TID WC  . insulin aspart  0-5 Units Subcutaneous QHS  . insulin glargine  34 Units Subcutaneous Daily  . methylPREDNISolone (SOLU-MEDROL) injection  60 mg Intravenous Q24H  . oxyCODONE-acetaminophen  0.5 tablet Oral QHS  . polyethylene glycol  17 g Oral Q M,W,F  . pravastatin  20 mg Oral QHS  . rivaroxaban  20 mg Oral Daily  . terazosin  2 mg Oral QHS  . triazolam  0.5 mg Oral QHS   Continuous Infusions:  PRN Meds:.acetaminophen **OR** acetaminophen, bisacodyl, ipratropium-albuterol, nitroGLYCERIN, ondansetron **OR** ondansetron (ZOFRAN) IV, polyethylene glycol, sodium phosphate   Data Review:   Micro Results Recent Results (from the past 240 hour(s))  Blood culture (routine x 2)     Status: None (Preliminary result)   Collection Time: 06/20/15 11:35 AM  Result Value Ref Range Status   Specimen Description BLOOD RIGHT ASSIST CONTROL  Final   Special Requests BOTTLES DRAWN AEROBIC AND ANAEROBIC  1CC  Final   Culture NO GROWTH < 24 HOURS  Final   Report Status PENDING  Incomplete  Blood culture (routine x 2)     Status: None (Preliminary result)   Collection Time: 06/20/15 11:35 AM  Result Value Ref Range Status   Specimen Description BLOOD LEFT HAND  Final   Special Requests   Final    BOTTLES DRAWN AEROBIC AND ANAEROBIC  AERO 4CC ANA 6CC  Culture NO GROWTH < 24 HOURS  Final   Report Status PENDING  Incomplete  MRSA PCR Screening     Status: None   Collection Time: 06/20/15  4:20 PM  Result  Value Ref Range Status   MRSA by PCR NEGATIVE NEGATIVE Final    Comment:        The GeneXpert MRSA Assay (FDA approved for NASAL specimens only), is one component of a comprehensive MRSA colonization surveillance program. It is not intended to diagnose MRSA infection nor to guide or monitor treatment for MRSA infections.   Culture, sputum-assessment     Status: None   Collection Time: 06/21/15 11:23 AM  Result Value Ref Range Status   Specimen Description EXPECTORATED SPUTUM  Final   Special Requests Normal  Final   Sputum evaluation THIS SPECIMEN IS ACCEPTABLE FOR SPUTUM CULTURE  Final   Report Status 06/21/2015 FINAL  Final    Radiology Reports Ct Angio Chest Pe W/cm &/or Wo Cm  06/20/2015  CLINICAL DATA:  Right-sided chest and abdominal pain EXAM: CT ANGIOGRAPHY CHEST AND ABDOMEN TECHNIQUE: Multidetector CT imaging of the chest and abdomen was performed using the standard protocol during bolus administration of intravenous contrast. Multiplanar CT image reconstructions and MIPs were obtained to evaluate the vascular anatomy. CONTRAST:  OMNIPAQUE IOHEXOL 350 MG/ML SOLN COMPARISON:  10/13/2008. FINDINGS: CTA CHEST FINDINGS The lungs are well aerated bilaterally and demonstrate patchy infiltrative changes throughout the right lung as well as the left lower lobe. No sizable effusion is seen. No pneumothorax is noted. The thoracic inlet shows no focal abnormality. Aortic calcifications are seen without aneurysmal dilatation or dissection. Changes of prior bypass grafting are seen. The pulmonary artery is well visualized and shows no evidence of pulmonary emboli. Scattered mediastinal lymph nodes are noted which demonstrate normal fatty hila. Degenerative changes of the thoracic spine are noted. Changes of prior median sternotomy are seen. Review of the MIP images confirms the above findings. CTA ABDOMEN FINDINGS The liver, gallbladder, spleen, adrenal glands and pancreas are within normal  limits with the exception of a small hypodensity measuring 18 mm in the tail of the pancreas. This lesion has enlarged slightly in the interval from 10/13/2008. The kidneys demonstrate a normal enhancement pattern. No calculi or obstructive changes are noted. The abdominal aorta demonstrates diffuse calcifications although no aneurysmal dilatation or dissection is seen. The visceral vessels are patent. Single renal artery is noted on the left. Dual renal artery on the right. Scattered atherosclerotic changes are noted without focal stenosis. Right common iliac and external iliac stenting is noted. Chronic compression deformity of L1 is noted. No other focal abnormality is seen. Review of the MIP images confirms the above findings. IMPRESSION: CTA of the chest: Patchy infiltrates throughout the right lung and left lower lobe consistent with acute pneumonia. This may be related to the patient's clinical symptomatology. No evidence of pulmonary emboli. No definitive aortic abnormality is seen. CTA of the abdomen: No acute aortic abnormality is seen. Diffuse atherosclerotic changes are noted. Likely cyst in the tail of the pancreas which is increased from 10 to 18 mm in the interval from 2010. Electronically Signed   By: Alcide Clever M.D.   On: 06/20/2015 11:09   Ct Angio Abdomen W/cm &/or Wo Contrast  06/20/2015  CLINICAL DATA:  Right-sided chest and abdominal pain EXAM: CT ANGIOGRAPHY CHEST AND ABDOMEN TECHNIQUE: Multidetector CT imaging of the chest and abdomen was performed using the standard protocol during bolus administration of intravenous contrast.  Multiplanar CT image reconstructions and MIPs were obtained to evaluate the vascular anatomy. CONTRAST:  OMNIPAQUE IOHEXOL 350 MG/ML SOLN COMPARISON:  10/13/2008. FINDINGS: CTA CHEST FINDINGS The lungs are well aerated bilaterally and demonstrate patchy infiltrative changes throughout the right lung as well as the left lower lobe. No sizable effusion is  seen. No pneumothorax is noted. The thoracic inlet shows no focal abnormality. Aortic calcifications are seen without aneurysmal dilatation or dissection. Changes of prior bypass grafting are seen. The pulmonary artery is well visualized and shows no evidence of pulmonary emboli. Scattered mediastinal lymph nodes are noted which demonstrate normal fatty hila. Degenerative changes of the thoracic spine are noted. Changes of prior median sternotomy are seen. Review of the MIP images confirms the above findings. CTA ABDOMEN FINDINGS The liver, gallbladder, spleen, adrenal glands and pancreas are within normal limits with the exception of a small hypodensity measuring 18 mm in the tail of the pancreas. This lesion has enlarged slightly in the interval from 10/13/2008. The kidneys demonstrate a normal enhancement pattern. No calculi or obstructive changes are noted. The abdominal aorta demonstrates diffuse calcifications although no aneurysmal dilatation or dissection is seen. The visceral vessels are patent. Single renal artery is noted on the left. Dual renal artery on the right. Scattered atherosclerotic changes are noted without focal stenosis. Right common iliac and external iliac stenting is noted. Chronic compression deformity of L1 is noted. No other focal abnormality is seen. Review of the MIP images confirms the above findings. IMPRESSION: CTA of the chest: Patchy infiltrates throughout the right lung and left lower lobe consistent with acute pneumonia. This may be related to the patient's clinical symptomatology. No evidence of pulmonary emboli. No definitive aortic abnormality is seen. CTA of the abdomen: No acute aortic abnormality is seen. Diffuse atherosclerotic changes are noted. Likely cyst in the tail of the pancreas which is increased from 10 to 18 mm in the interval from 2010. Electronically Signed   By: Alcide Clever M.D.   On: 06/20/2015 11:09   Dg Chest Portable 1 View  06/20/2015  CLINICAL DATA:   Right-sided chest pain, initial encounter EXAM: PORTABLE CHEST 1 VIEW COMPARISON:  03/14/2015 FINDINGS: Cardiac shadow is again enlarged. Postsurgical changes are again seen. The lungs are well aerated with improved aeration in the bases bilaterally. No bony abnormality is seen. IMPRESSION: No acute abnormality noted. The overall appearance is improved from the prior study. Electronically Signed   By: Alcide Clever M.D.   On: 06/20/2015 08:01     CBC  Recent Labs Lab 06/20/15 0745 06/21/15 0514  WBC 21.7* 15.7*  HGB 12.6* 12.0*  HCT 40.3 36.5*  PLT 259 228  MCV 91.2 88.7  MCH 28.6 29.2  MCHC 31.4* 32.9  RDW 14.2 14.2    Chemistries   Recent Labs Lab 06/20/15 0745 06/21/15 0514  NA 134* 140  K 3.8 4.0  CL 99* 105  CO2 27 29  GLUCOSE 214* 232*  BUN 29* 29*  CREATININE 1.68* 1.49*  CALCIUM 8.3* 8.5*  AST 24  --   ALT 19  --   ALKPHOS 86  --   BILITOT 0.7  --    ------------------------------------------------------------------------------------------------------------------ estimated creatinine clearance is 40.7 mL/min (by C-G formula based on Cr of 1.49). ------------------------------------------------------------------------------------------------------------------ No results for input(s): HGBA1C in the last 72 hours. ------------------------------------------------------------------------------------------------------------------ No results for input(s): CHOL, HDL, LDLCALC, TRIG, CHOLHDL, LDLDIRECT in the last 72 hours. ------------------------------------------------------------------------------------------------------------------ No results for input(s): TSH, T4TOTAL, T3FREE, THYROIDAB in the last  72 hours.  Invalid input(s): FREET3 ------------------------------------------------------------------------------------------------------------------ No results for input(s): VITAMINB12, FOLATE, FERRITIN, TIBC, IRON, RETICCTPCT in the last 72 hours.  Coagulation  profile No results for input(s): INR, PROTIME in the last 168 hours.  No results for input(s): DDIMER in the last 72 hours.  Cardiac Enzymes  Recent Labs Lab 06/20/15 0745  TROPONINI 0.08*   ------------------------------------------------------------------------------------------------------------------ Invalid input(s): POCBNP    Assessment & Plan  Patient is a 82 year old admitted with shortness of breath and cough   #1 Bilateral multifocal pneumonia with acute respiratory failure and sepsis -Continue IV steroids, Antibiotics -Continue Nebulizers -Wean O2 as tolerated - Improving   * Chronic Systolic CHF - EF 25% continue Lasix orally daily No signs of fluid overload  * pAfib Continue aspirin and Xarelto  * CKD stage III is stable continue monitor  * Diabetes mellitus, insulin-dependent Continue insulin as taking at home blood sugar stable  * DVT prophylaxis. He is on Xarelto.     Code Status Orders        Start     Ordered   06/20/15 1204  Full code   Continuous     06/20/15 1204    Code Status History    Date Active Date Inactive Code Status Order ID Comments User Context   03/14/2015 10:19 PM 03/18/2015  5:46 PM Full Code [id number]  Wyatt Haste, MD ED   10/02/2014  3:05 AM 10/02/2014  5:53 PM Full Code [id number]  Gale Journey, MD Inpatient   09/10/2014 12:05 PM 09/12/2014  9:31 PM Full Code [id number]  Altamese Dilling, MD Inpatient           Consults none DVT Prophylaxis Xarelto  Lab Results  Component Value Date   PLT 228 06/21/2015     Time Spent in minutes   Auburn Bilberry M.D on 06/21/2015 at 1:01 PM  Between 7am to 6pm - Pager - [phone]  After 6pm go to [email or web address] - password [email or web address] [facility]  [facility] [place] [facility]   Office  [phone]

## 2015-06-22 LAB — GLUCOSE, CAPILLARY
GLUCOSE-CAPILLARY: 223 mg/dL — AB (ref 65–99)
GLUCOSE-CAPILLARY: 208 mg/dL — AB (ref 65–99)
Glucose-Capillary: 260 mg/dL — ABNORMAL HIGH (ref 65–99)
Glucose-Capillary: 398 mg/dL — ABNORMAL HIGH (ref 65–99)

## 2015-06-22 LAB — BASIC METABOLIC PANEL
Anion gap: 9 (ref 5–15)
BUN: 35 mg/dL — AB (ref 6–20)
CO2: 26 mmol/L (ref 22–32)
CREATININE: 1.4 mg/dL — AB (ref 0.61–1.24)
Calcium: 8.7 mg/dL — ABNORMAL LOW (ref 8.9–10.3)
Chloride: 107 mmol/L (ref 101–111)
GFR, EST AFRICAN AMERICAN: 49 mL/min — AB (ref >60)
GFR, EST NON AFRICAN AMERICAN: 42 mL/min — AB (ref >60)
Glucose, Bld: 237 mg/dL — ABNORMAL HIGH (ref 65–99)
Potassium: 4 mmol/L (ref 3.5–5.1)
SODIUM: 142 mmol/L (ref 135–145)

## 2015-06-22 LAB — CBC
HCT: 36.7 % — ABNORMAL LOW (ref 40.0–52.0)
HEMOGLOBIN: 12 g/dL — AB (ref 13.0–18.0)
MCH: 29.4 pg (ref 26.0–34.0)
MCHC: 32.7 g/dL (ref 32.0–36.0)
MCV: 89.9 fL (ref 80.0–100.0)
PLATELETS: 234 10*3/uL (ref 150–440)
RBC: 4.08 MIL/uL — ABNORMAL LOW (ref 4.40–5.90)
RDW: 13.8 % (ref 11.5–14.5)
WBC: 14.6 10*3/uL — AB (ref 3.8–10.6)

## 2015-06-22 MED ORDER — RIVAROXABAN 15 MG PO TABS
15.0000 mg | ORAL_TABLET | Freq: Every day | ORAL | Status: DC
  Administered 2015-06-23: 15 mg via ORAL
  Filled 2015-06-22: qty 1

## 2015-06-22 MED ORDER — INSULIN GLARGINE 100 UNIT/ML ~~LOC~~ SOLN
38.0000 [IU] | Freq: Every day | SUBCUTANEOUS | Status: DC
  Administered 2015-06-23: 08:00:00 38 [IU] via SUBCUTANEOUS
  Filled 2015-06-22: qty 0.38

## 2015-06-22 NOTE — Progress Notes (Signed)
Patient weaned to 1L-O2. Bo Mcclintock, RN

## 2015-06-22 NOTE — Progress Notes (Signed)
[facility] [place] at [facility]                                                                                                                                                                                            Patient Demographics   Delvon Chipps, is a 82 y.o. male, DOB - [birth year], ZOX:[id number]  Admit date - 06/20/2015   Admitting Physician Milagros Loll, MD  Outpatient Primary MD for the patient is Pcp Not In System   LOS - 2  Subjective: Patient still complaining of cough and wheezing. Denies any chest pain    Review of Systems:   Review of Systems  Constitutional: No further chills and malaise/fatigue. Negative for fever and weight loss.  HENT: Negative for hearing loss and nosebleeds.  Eyes: Negative for blurred vision, double vision and pain.  Respiratory: Improved cough, shortness of breath and wheezing. Negative for hemoptysis and sputum production.  Cardiovascular: Resolved chest pain (right lower). Negative for palpitations, orthopnea and leg swelling.  Gastrointestinal: Negative for nausea, vomiting, abdominal pain, diarrhea and constipation.  Genitourinary: Negative for dysuria and hematuria.  Musculoskeletal: Negative for myalgias, back pain and falls.  Skin: Negative for rash.  Neurological: Positive for weakness. Negative for dizziness, tremors, sensory change, speech change, focal weakness, seizures and headaches.  Endo/Heme/Allergies: Does not bruise/bleed easily.  Psychiatric/Behavioral: Negative for depression and memory loss. The patient is not nervous/anxious.     Vitals:   Filed Vitals:   06/21/15 2127 06/22/15 0513 06/22/15 0519 06/22/15 1144  BP: 147/81  151/76 153/97  Pulse: 67  57 67  Temp: 97.9 F (36.6 C)  97.7 F (36.5 C) 97.5 F (36.4 C)  TempSrc: Oral  Oral Oral  Resp: 24  22   Height:      Weight:      SpO2: 100% 99% 100% 98%    Wt Readings from Last 3 Encounters:  06/21/15 106.414 kg (234  lb 9.6 oz)  04/21/15 108.863 kg (240 lb)  03/14/15 107.956 kg (238 lb)     Intake/Output Summary (Last 24 hours) at 06/22/15 1319 Last data filed at 06/22/15 0149  Gross per 24 hour  Intake    240 ml  Output   1150 ml  Net   -910 ml    Physical Exam:   GENERAL: 82 y.o.-year-old patient lying in the bed with mild respiratory distress. EYES: Pupils equal, round, reactive to light and accommodation. No scleral icterus. Extraocular muscles intact.  HEENT: Head atraumatic, normocephalic. Oropharynx and nasopharynx clear. No oropharyngeal erythema, moist oral mucosa  NECK: Supple, no jugular venous distention. No thyroid enlargement,  no tenderness.  LUNGS: Bilateral wheezing no accessory muscle use, no cracles or gallops CARDIOVASCULAR: S1, S2 normal. No murmurs, rubs, or gallops.  ABDOMEN: Soft, nontender, nondistended. Bowel sounds present. No organomegaly or mass.  EXTREMITIES: No pedal edema, cyanosis, or clubbing. + 2 pedal & radial pulses b/l.  NEUROLOGIC: Cranial nerves II through XII are intact. No focal Motor or sensory deficits appreciated b/l PSYCHIATRIC: The patient is alert and oriented x 3. Good affect.  SKIN: No obvious rash, lesion, or ulcer.   Antibiotics   Anti-infectives    Start     Dose/Rate Route Frequency Ordered Stop   06/21/15 1800  azithromycin (ZITHROMAX) 500 mg in dextrose 5 % 250 mL IVPB     500 mg 250 mL/hr over 60 Minutes Intravenous Every 24 hours 06/20/15 1622     06/21/15 1200  cefTRIAXone (ROCEPHIN) 1 g in dextrose 5 % 50 mL IVPB     1 g 100 mL/hr over 30 Minutes Intravenous Every 24 hours 06/21/15 1153     06/20/15 1200  azithromycin (ZITHROMAX) 500 mg in dextrose 5 % 250 mL IVPB  Status:  Discontinued     500 mg 250 mL/hr over 60 Minutes Intravenous Every 24 hours 06/20/15 1151 06/20/15 1622   06/20/15 1130  cefTRIAXone (ROCEPHIN) 1 g in dextrose 5 % 50 mL IVPB     1 g 100 mL/hr over 30 Minutes Intravenous  Once 06/20/15 1115 06/20/15  1204      Medications   Scheduled Meds: . aspirin  325 mg Oral Daily  . azithromycin  500 mg Intravenous Q24H  . cefTRIAXone (ROCEPHIN)  IV  1 g Intravenous Q24H  . dextromethorphan  30 mg Oral Q12H   And  . guaiFENesin  600 mg Oral Q12H  . docusate sodium  100 mg Oral BID  . furosemide  40 mg Oral Daily  . gabapentin  300 mg Oral BID  . gabapentin  600 mg Oral Daily  . guaiFENesin  600 mg Oral BID  . insulin aspart  0-15 Units Subcutaneous TID WC  . insulin aspart  0-5 Units Subcutaneous QHS  . insulin glargine  34 Units Subcutaneous Daily  . methylPREDNISolone (SOLU-MEDROL) injection  60 mg Intravenous Q24H  . oxyCODONE-acetaminophen  0.5 tablet Oral QHS  . polyethylene glycol  17 g Oral Q M,W,F  . pravastatin  20 mg Oral QHS  . [START ON 06/23/2015] rivaroxaban  15 mg Oral Daily  . terazosin  2 mg Oral QHS  . triazolam  0.5 mg Oral QHS   Continuous Infusions:  PRN Meds:.acetaminophen **OR** acetaminophen, bisacodyl, ipratropium-albuterol, nitroGLYCERIN, ondansetron **OR** ondansetron (ZOFRAN) IV, polyethylene glycol, sodium phosphate   Data Review:   Micro Results Recent Results (from the past 240 hour(s))  Blood culture (routine x 2)     Status: None (Preliminary result)   Collection Time: 06/20/15 11:35 AM  Result Value Ref Range Status   Specimen Description BLOOD RIGHT ASSIST CONTROL  Final   Special Requests BOTTLES DRAWN AEROBIC AND ANAEROBIC  1CC  Final   Culture NO GROWTH 2 DAYS  Final   Report Status PENDING  Incomplete  Blood culture (routine x 2)     Status: None (Preliminary result)   Collection Time: 06/20/15 11:35 AM  Result Value Ref Range Status   Specimen Description BLOOD LEFT HAND  Final   Special Requests   Final    BOTTLES DRAWN AEROBIC AND ANAEROBIC  AERO 4CC ANA 6CC   Culture NO GROWTH 2  DAYS  Final   Report Status PENDING  Incomplete  MRSA PCR Screening     Status: None   Collection Time: 06/20/15  4:20 PM  Result Value Ref Range Status    MRSA by PCR NEGATIVE NEGATIVE Final    Comment:        The GeneXpert MRSA Assay (FDA approved for NASAL specimens only), is one component of a comprehensive MRSA colonization surveillance program. It is not intended to diagnose MRSA infection nor to guide or monitor treatment for MRSA infections.   Culture, sputum-assessment     Status: None   Collection Time: 06/21/15 11:23 AM  Result Value Ref Range Status   Specimen Description EXPECTORATED SPUTUM  Final   Special Requests Normal  Final   Sputum evaluation THIS SPECIMEN IS ACCEPTABLE FOR SPUTUM CULTURE  Final   Report Status 06/21/2015 FINAL  Final  Culture, respiratory (NON-Expectorated)     Status: None (Preliminary result)   Collection Time: 06/21/15 11:23 AM  Result Value Ref Range Status   Specimen Description EXPECTORATED SPUTUM  Final   Special Requests Normal Reflexed from [id number]  Final   Gram Stain   Final    MANY WBC SEEN FEW SQUAMOUS EPITHELIAL CELLS PRESENT FEW GRAM POSITIVE RODS FEW GRAM NEGATIVE RODS RARE GRAM POSITIVE COCCI GOOD SPECIMEN - 80-90% WBCS    Culture HOLDING FOR POSSIBLE PATHOGEN  Final   Report Status PENDING  Incomplete    Radiology Reports Ct Angio Chest Pe W/cm &/or Wo Cm  06/20/2015  CLINICAL DATA:  Right-sided chest and abdominal pain EXAM: CT ANGIOGRAPHY CHEST AND ABDOMEN TECHNIQUE: Multidetector CT imaging of the chest and abdomen was performed using the standard protocol during bolus administration of intravenous contrast. Multiplanar CT image reconstructions and MIPs were obtained to evaluate the vascular anatomy. CONTRAST:  OMNIPAQUE IOHEXOL 350 MG/ML SOLN COMPARISON:  10/13/2008. FINDINGS: CTA CHEST FINDINGS The lungs are well aerated bilaterally and demonstrate patchy infiltrative changes throughout the right lung as well as the left lower lobe. No sizable effusion is seen. No pneumothorax is noted. The thoracic inlet shows no focal abnormality. Aortic calcifications are seen  without aneurysmal dilatation or dissection. Changes of prior bypass grafting are seen. The pulmonary artery is well visualized and shows no evidence of pulmonary emboli. Scattered mediastinal lymph nodes are noted which demonstrate normal fatty hila. Degenerative changes of the thoracic spine are noted. Changes of prior median sternotomy are seen. Review of the MIP images confirms the above findings. CTA ABDOMEN FINDINGS The liver, gallbladder, spleen, adrenal glands and pancreas are within normal limits with the exception of a small hypodensity measuring 18 mm in the tail of the pancreas. This lesion has enlarged slightly in the interval from 10/13/2008. The kidneys demonstrate a normal enhancement pattern. No calculi or obstructive changes are noted. The abdominal aorta demonstrates diffuse calcifications although no aneurysmal dilatation or dissection is seen. The visceral vessels are patent. Single renal artery is noted on the left. Dual renal artery on the right. Scattered atherosclerotic changes are noted without focal stenosis. Right common iliac and external iliac stenting is noted. Chronic compression deformity of L1 is noted. No other focal abnormality is seen. Review of the MIP images confirms the above findings. IMPRESSION: CTA of the chest: Patchy infiltrates throughout the right lung and left lower lobe consistent with acute pneumonia. This may be related to the patient's clinical symptomatology. No evidence of pulmonary emboli. No definitive aortic abnormality is seen. CTA of the abdomen: No acute aortic  abnormality is seen. Diffuse atherosclerotic changes are noted. Likely cyst in the tail of the pancreas which is increased from 10 to 18 mm in the interval from 2010. Electronically Signed   By: Alcide Clever M.D.   On: 06/20/2015 11:09   Ct Angio Abdomen W/cm &/or Wo Contrast  06/20/2015  CLINICAL DATA:  Right-sided chest and abdominal pain EXAM: CT ANGIOGRAPHY CHEST AND ABDOMEN TECHNIQUE:  Multidetector CT imaging of the chest and abdomen was performed using the standard protocol during bolus administration of intravenous contrast. Multiplanar CT image reconstructions and MIPs were obtained to evaluate the vascular anatomy. CONTRAST:  OMNIPAQUE IOHEXOL 350 MG/ML SOLN COMPARISON:  10/13/2008. FINDINGS: CTA CHEST FINDINGS The lungs are well aerated bilaterally and demonstrate patchy infiltrative changes throughout the right lung as well as the left lower lobe. No sizable effusion is seen. No pneumothorax is noted. The thoracic inlet shows no focal abnormality. Aortic calcifications are seen without aneurysmal dilatation or dissection. Changes of prior bypass grafting are seen. The pulmonary artery is well visualized and shows no evidence of pulmonary emboli. Scattered mediastinal lymph nodes are noted which demonstrate normal fatty hila. Degenerative changes of the thoracic spine are noted. Changes of prior median sternotomy are seen. Review of the MIP images confirms the above findings. CTA ABDOMEN FINDINGS The liver, gallbladder, spleen, adrenal glands and pancreas are within normal limits with the exception of a small hypodensity measuring 18 mm in the tail of the pancreas. This lesion has enlarged slightly in the interval from 10/13/2008. The kidneys demonstrate a normal enhancement pattern. No calculi or obstructive changes are noted. The abdominal aorta demonstrates diffuse calcifications although no aneurysmal dilatation or dissection is seen. The visceral vessels are patent. Single renal artery is noted on the left. Dual renal artery on the right. Scattered atherosclerotic changes are noted without focal stenosis. Right common iliac and external iliac stenting is noted. Chronic compression deformity of L1 is noted. No other focal abnormality is seen. Review of the MIP images confirms the above findings. IMPRESSION: CTA of the chest: Patchy infiltrates throughout the right lung and left  lower lobe consistent with acute pneumonia. This may be related to the patient's clinical symptomatology. No evidence of pulmonary emboli. No definitive aortic abnormality is seen. CTA of the abdomen: No acute aortic abnormality is seen. Diffuse atherosclerotic changes are noted. Likely cyst in the tail of the pancreas which is increased from 10 to 18 mm in the interval from 2010. Electronically Signed   By: Alcide Clever M.D.   On: 06/20/2015 11:09   Dg Chest Portable 1 View  06/20/2015  CLINICAL DATA:  Right-sided chest pain, initial encounter EXAM: PORTABLE CHEST 1 VIEW COMPARISON:  03/14/2015 FINDINGS: Cardiac shadow is again enlarged. Postsurgical changes are again seen. The lungs are well aerated with improved aeration in the bases bilaterally. No bony abnormality is seen. IMPRESSION: No acute abnormality noted. The overall appearance is improved from the prior study. Electronically Signed   By: Alcide Clever M.D.   On: 06/20/2015 08:01     CBC  Recent Labs Lab 06/20/15 0745 06/21/15 0514 06/22/15 0510  WBC 21.7* 15.7* 14.6*  HGB 12.6* 12.0* 12.0*  HCT 40.3 36.5* 36.7*  PLT 259 228 234  MCV 91.2 88.7 89.9  MCH 28.6 29.2 29.4  MCHC 31.4* 32.9 32.7  RDW 14.2 14.2 13.8    Chemistries   Recent Labs Lab 06/20/15 0745 06/21/15 0514 06/22/15 0510  NA 134* 140 142  K 3.8 4.0 4.0  CL 99* 105 107  CO2 27 29 26   GLUCOSE 214* 232* 237*  BUN 29* 29* 35*  CREATININE 1.68* 1.49* 1.40*  CALCIUM 8.3* 8.5* 8.7*  AST 24  --   --   ALT 19  --   --   ALKPHOS 86  --   --   BILITOT 0.7  --   --    ------------------------------------------------------------------------------------------------------------------ estimated creatinine clearance is 43.3 mL/min (by C-G formula based on Cr of 1.4). ------------------------------------------------------------------------------------------------------------------ No results for input(s): HGBA1C in the last 72  hours. ------------------------------------------------------------------------------------------------------------------ No results for input(s): CHOL, HDL, LDLCALC, TRIG, CHOLHDL, LDLDIRECT in the last 72 hours. ------------------------------------------------------------------------------------------------------------------ No results for input(s): TSH, T4TOTAL, T3FREE, THYROIDAB in the last 72 hours.  Invalid input(s): FREET3 ------------------------------------------------------------------------------------------------------------------ No results for input(s): VITAMINB12, FOLATE, FERRITIN, TIBC, IRON, RETICCTPCT in the last 72 hours.  Coagulation profile No results for input(s): INR, PROTIME in the last 168 hours.  No results for input(s): DDIMER in the last 72 hours.  Cardiac Enzymes  Recent Labs Lab 06/20/15 0745  TROPONINI 0.08*   ------------------------------------------------------------------------------------------------------------------ Invalid input(s): POCBNP    Assessment & Plan  Patient is a 82 year old admitted with shortness of breath and cough   #1 Bilateral multifocal pneumonia with acute respiratory failure and sepsis -Continue IV steroids, Antibiotics -Continue Nebulizers -Wean O2 as tolerated - Improving slowly   * Chronic Systolic CHF - EF 25% continue Lasix orally daily No signs of fluid overload  * pAfib Continue aspirin and Xarelto  * CKD stage III is stable continue monitor  * Diabetes mellitus, insulin-dependent Blood glucose elevated due to steroids increased Lantus dose  * DVT prophylaxis. He is on Xarelto.     Code Status Orders        Start     Ordered   06/20/15 1204  Full code   Continuous     06/20/15 1204    Code Status History    Date Active Date Inactive Code Status Order ID Comments User Context   03/14/2015 10:19 PM 03/18/2015  5:46 PM Full Code [id number]  Wyatt Haste, MD ED   10/02/2014  3:05 AM 10/02/2014   5:53 PM Full Code [id number]  Gale Journey, MD Inpatient   09/10/2014 12:05 PM 09/12/2014  9:31 PM Full Code [id number]  Altamese Dilling, MD Inpatient           Consults none DVT Prophylaxis Xarelto  Lab Results  Component Value Date   PLT 234 06/22/2015     Time Spent in minutes  35 minutes Auburn Bilberry M.D on 06/22/2015 at 1:19 PM  Between 7am to 6pm - Pager - [phone]  After 6pm go to [email or web address] - password [email or web address] [facility]  [facility] [place] [facility]   Office  [phone]

## 2015-06-22 NOTE — Care Management Important Message (Signed)
Important Message  Patient Details  Name: Martin [name] MRN: [id number] Date of Birth: 08/06/1921   Medicare Important Message Given:  Yes    Olegario Messier A Wreatha Sturgeon 06/22/2015, 12:22 PM

## 2015-06-22 NOTE — Progress Notes (Signed)
Anticoagulation Monitoring  Patient is a 82 yo male with orders for Xarelto 20 mg po daily.  Patient's est CrCl~43 mL/min.  Per anticoagulation policy, will transition patient to Xarelto 15 mg po daily based on current CrCl.  Will continue to follow and transition back to previous dose when CrCl >50 mL/min.    Hgb: 12, Plts: 234  Clarisa Schools, PharmD Clinical Pharmacist 06/22/2015

## 2015-06-22 NOTE — Progress Notes (Signed)
Patient was seen one time in the Heart Failure Clinic on 04/21/15 & then cancelled his next appointment on 05/25/15. Have made a follow-up appointment with [place] on July 05, 2015 at 12:30pm. Thank you.

## 2015-06-23 LAB — CBC
HCT: 37.7 % — ABNORMAL LOW (ref 40.0–52.0)
Hemoglobin: 12.2 g/dL — ABNORMAL LOW (ref 13.0–18.0)
MCH: 29.6 pg (ref 26.0–34.0)
MCHC: 32.2 g/dL (ref 32.0–36.0)
MCV: 91.9 fL (ref 80.0–100.0)
PLATELETS: 243 10*3/uL (ref 150–440)
RBC: 4.1 MIL/uL — ABNORMAL LOW (ref 4.40–5.90)
RDW: 14.1 % (ref 11.5–14.5)
WBC: 13.1 10*3/uL — AB (ref 3.8–10.6)

## 2015-06-23 LAB — GLUCOSE, CAPILLARY
GLUCOSE-CAPILLARY: 180 mg/dL — AB (ref 65–99)
Glucose-Capillary: 202 mg/dL — ABNORMAL HIGH (ref 65–99)

## 2015-06-23 MED ORDER — LEVOFLOXACIN 500 MG PO TABS: 500.0000 mg | ORAL_TABLET | Freq: Every day | ORAL | Status: DC

## 2015-06-23 MED ORDER — RIVAROXABAN 20 MG PO TABS: 20.0000 mg | ORAL_TABLET | Freq: Every day | ORAL | Status: DC

## 2015-06-23 NOTE — Progress Notes (Signed)
Inpatient Diabetes Program Recommendations  AACE/ADA: New Consensus Statement on Inpatient Glycemic Control (2015)  Target Ranges:  Prepandial:   less than 140 mg/dL      Peak postprandial:   less than 180 mg/dL (1-2 hours)      Critically ill patients:  140 - 180 mg/dL  Results for PHEONIX, CLINKSCALE (MRN [id number]) as of 06/23/2015 09:58  Ref. Range 06/22/2015 07:25 06/22/2015 11:41 06/22/2015 17:05 06/22/2015 21:56 06/23/2015 07:39  Glucose-Capillary Latest Ref Range: 65-99 mg/dL 409 (H) 811 (H) 914 (H) 398 (H) 180 (H)   Review of Glycemic Control  Diabetes history: DM2 Outpatient Diabetes medications: Lantus 34 units daily, Novolog 3 units TID with meals Current orders for Inpatient glycemic control: Lantus 38 units daily, Novolog 0-15 units TID with meals, Novolog 0-5 units QHS  Inpatient Diabetes Program Recommendations: Insulin - Basal: Noted Lantus was increased from 34 to 38 units yesterday and patient received Lantus 38 units this morning. Insulin - Meal Coverage: Patient takes Novolog 3 units TID with meals as an outpatient and patient is ordered Solumedrol 60 mg Q24H which is contributing to elevated post prandial glucose. Please consider ordering Novolog 4 units TID with meals for meal coverage (in addition to Novolog correction scale).  Thanks, Orlando Penner, RN, MSN, CDE Diabetes Coordinator Inpatient Diabetes Program [phone] (Team Pager from 8am to 5pm) [phone] ([facility] office) [phone] [facility] office) [phone] [facility] office)

## 2015-06-23 NOTE — Clinical Social Work Note (Signed)
Pt is ready for discharge today to [facility]. Pt and family are agreeable to discharge plan. Facility has received discharge information and is able to admit pt. Pt's son in law will provide information. CSW is signing off as no further needs identified.   Dede Query, MSW, LCSW Clinical Social Worker  [phone]

## 2015-06-23 NOTE — [place] FL2 (Signed)
[place] MEDICAID FL2 LEVEL OF CARE SCREENING TOOL     IDENTIFICATION  Patient Name: Martin [name] Birthdate: 1922/02/14 Sex: male Admission Date (Current Location): 06/20/2015  [facility] and [place] Number:  Chiropodist and Address:  [facility], [address] [place], [place] [address]      Provider Number: [id number]  Attending Physician Name and Address:  Auburn Bilberry, MD  Relative Name and Phone Number:       Current Level of Care: Hospital Recommended Level of Care: Assisted Living Facility Prior Approval Number:    Date Approved/Denied:   PASRR Number:    Discharge Plan: Domiciliary (Rest home)    Current Diagnoses: Patient Active Problem List   Diagnosis Date Noted  . Pneumonia 06/20/2015  . Sepsis (HCC) 03/14/2015  . Diabetes mellitus type 2 in obese (HCC) 10/01/2014  . Atrial fibrillation (HCC) 10/01/2014  . Chronic kidney disease, stage III (moderate) 10/01/2014  . Systolic congestive heart failure (HCC) 10/01/2014  . Peripheral vascular disease (HCC) 10/01/2014  . Coronary artery disease 10/01/2014    Orientation RESPIRATION BLADDER Height & Weight     Self, Time, Situation, Place  Normal Continent Weight: 239 lb 9.6 oz (108.682 kg) Height:  6' (182.9 cm)  BEHAVIORAL SYMPTOMS/MOOD NEUROLOGICAL BOWEL NUTRITION STATUS      Continent Diet (Carb Modified, Thin Liquids)  AMBULATORY STATUS COMMUNICATION OF NEEDS Skin   Limited Assist Verbally Normal                       Personal Care Assistance Level of Assistance  Bathing, Feeding, Dressing Bathing Assistance: Limited assistance Feeding assistance: Independent Dressing Assistance: Limited assistance     Functional Limitations Info  Sight, Hearing, Speech Sight Info: Adequate Hearing Info: Impaired Speech Info: Adequate    SPECIAL CARE FACTORS FREQUENCY  PT (By licensed PT)     PT Frequency: HHPT              Contractures       Additional Factors Info  Code Status, Allergies Code Status:  Full Code Allergies Info: No known allergies           Current Medications (06/23/2015):  This is the current hospital active medication list Current Facility-Administered Medications  Medication Dose Route Frequency Provider Last Rate Last Dose  . acetaminophen (TYLENOL) tablet 650 mg  650 mg Oral Q6H PRN Milagros Loll, MD       Or  . acetaminophen (TYLENOL) suppository 650 mg  650 mg Rectal Q6H PRN Srikar Sudini, MD      . aspirin tablet 325 mg  325 mg Oral Daily Milagros Loll, MD   325 mg at 06/23/15 5284  . azithromycin (ZITHROMAX) 500 mg in dextrose 5 % 250 mL IVPB  500 mg Intravenous Q24H Bertram Savin, RPH   500 mg at 06/22/15 1723  . bisacodyl (DULCOLAX) EC tablet 5 mg  5 mg Oral Daily PRN Srikar Sudini, MD      . cefTRIAXone (ROCEPHIN) 1 g in dextrose 5 % 50 mL IVPB  1 g Intravenous Q24H Auburn Bilberry, MD   1 g at 06/23/15 1000  . dextromethorphan (DELSYM) 30 MG/5ML liquid 30 mg  30 mg Oral Q12H Srikar Sudini, MD   30 mg at 06/22/15 2249   And  . guaiFENesin (MUCINEX) 12 hr tablet 600 mg  600 mg Oral Q12H Srikar Sudini, MD   600 mg at 06/22/15 0916  . docusate sodium (COLACE) capsule  100 mg  100 mg Oral BID Milagros Loll, MD   100 mg at 06/23/15 0824  . furosemide (LASIX) tablet 40 mg  40 mg Oral Daily Milagros Loll, MD   40 mg at 06/23/15 0824  . gabapentin (NEURONTIN) capsule 300 mg  300 mg Oral BID Milagros Loll, MD   300 mg at 06/23/15 0824  . gabapentin (NEURONTIN) capsule 600 mg  600 mg Oral Daily Milagros Loll, MD   600 mg at 06/22/15 2250  . guaiFENesin (MUCINEX) 12 hr tablet 600 mg  600 mg Oral BID Auburn Bilberry, MD   600 mg at 06/23/15 0823  . insulin aspart (novoLOG) injection 0-15 Units  0-15 Units Subcutaneous TID WC Milagros Loll, MD   5 Units at 06/23/15 1129  . insulin aspart (novoLOG) injection 0-5 Units  0-5 Units Subcutaneous QHS Milagros Loll, MD   5 Units at 06/22/15 2252  . insulin  glargine (LANTUS) injection 38 Units  38 Units Subcutaneous Daily Auburn Bilberry, MD   38 Units at 06/23/15 [phone]  . ipratropium-albuterol (DUONEB) 0.5-2.5 (3) MG/3ML nebulizer solution 3 mL  3 mL Nebulization Q4H PRN Milagros Loll, MD   3 mL at 06/22/15 1311  . methylPREDNISolone sodium succinate (SOLU-MEDROL) 125 mg/2 mL injection 60 mg  60 mg Intravenous Q24H Milagros Loll, MD   60 mg at 06/23/15 1128  . nitroGLYCERIN (NITROSTAT) SL tablet 0.4 mg  0.4 mg Sublingual Q5 min PRN Srikar Sudini, MD      . ondansetron (ZOFRAN) tablet 4 mg  4 mg Oral Q6H PRN Milagros Loll, MD       Or  . ondansetron (ZOFRAN) injection 4 mg  4 mg Intravenous Q6H PRN Srikar Sudini, MD      . oxyCODONE-acetaminophen (PERCOCET/ROXICET) 5-325 MG per tablet 0.5 tablet  0.5 tablet Oral QHS Milagros Loll, MD   0.5 tablet at 06/22/15 2247  . polyethylene glycol (MIRALAX / GLYCOLAX) packet 17 g  17 g Oral Daily PRN Srikar Sudini, MD      . polyethylene glycol (MIRALAX / GLYCOLAX) packet 17 g  17 g Oral Q M,W,F Milagros Loll, MD   17 g at 06/23/15 8657  . pravastatin (PRAVACHOL) tablet 20 mg  20 mg Oral QHS Milagros Loll, MD   20 mg at 06/22/15 2251  . [START ON 06/24/2015] rivaroxaban (XARELTO) tablet 20 mg  20 mg Oral Daily Auburn Bilberry, MD      . sodium phosphate (FLEET) 7-19 GM/118ML enema 1 enema  1 enema Rectal Once PRN Srikar Sudini, MD      . terazosin (HYTRIN) capsule 2 mg  2 mg Oral QHS Milagros Loll, MD   2 mg at 06/22/15 2248  . triazolam (HALCION) tablet 0.5 mg  0.5 mg Oral QHS Milagros Loll, MD   0.5 mg at 06/22/15 2247     Discharge Medications: Please see discharge summary for a list of discharge medications.  TAKE these medications       albuterol 108 (90 Base) MCG/ACT inhaler  Commonly known as: PROVENTIL HFA;VENTOLIN HFA  Inhale 2 puffs into the lungs every 6 (six) hours as needed for wheezing or shortness of breath.     aspirin 325 MG tablet  Take 325 mg by mouth daily.     dextrose  40 % Gel  Commonly known as: GLUTOSE  Take 1 Tube by mouth as needed for low blood sugar.     docusate sodium 100 MG capsule  Commonly known as: COLACE  Take 100 mg by  mouth 2 (two) times daily.     furosemide 40 MG tablet  Commonly known as: LASIX  Take 1 tablet (40 mg total) by mouth daily.     gabapentin 300 MG capsule  Commonly known as: NEURONTIN  Take 300-600 mg by mouth 3 (three) times daily. Take 1 capsule at 0900 & 1400 and 2 capsules at 2000.     guaiFENesin-dextromethorphan 100-10 MG/5ML syrup  Commonly known as: ROBITUSSIN DM  Take 15 mLs by mouth every 4 (four) hours as needed for cough.     dextromethorphan-guaiFENesin 30-600 MG 12hr tablet  Commonly known as: MUCINEX DM  Take 1 tablet by mouth every 12 (twelve) hours.     insulin aspart 100 UNIT/ML injection  Commonly known as: novoLOG  Inject 3 Units into the skin 3 (three) times daily with meals.     insulin glargine 100 UNIT/ML injection  Commonly known as: LANTUS  Inject 0.32 mLs (32 Units total) into the skin daily.     ipratropium-albuterol 0.5-2.5 (3) MG/3ML Soln  Commonly known as: DUONEB  Take 3 mLs by nebulization every 6 (six) hours as needed.     levofloxacin 500 MG tablet  Commonly known as: LEVAQUIN  Take 1 tablet (500 mg total) by mouth daily.     nitroGLYCERIN 0.4 MG SL tablet  Commonly known as: NITROSTAT  Place 1 tablet (0.4 mg total) under the tongue every 5 (five) minutes as needed for chest pain.     oxyCODONE-acetaminophen 5-325 MG tablet  Commonly known as: PERCOCET/ROXICET  Take 0.5 tablets by mouth at bedtime. Pt also takes one-half tablet every six hours as needed for moderate/severe pain.     polyethylene glycol packet  Commonly known as: MIRALAX / GLYCOLAX  Take 17 g by mouth every Monday, Wednesday, and Friday.     pravastatin 20 MG tablet  Commonly known as: PRAVACHOL  Take 20 mg by mouth  at bedtime.     rivaroxaban 20 MG Tabs tablet  Commonly known as: XARELTO  Take 20 mg by mouth daily.     terazosin 2 MG capsule  Commonly known as: HYTRIN  Take 2 mg by mouth at bedtime.     triazolam 0.25 MG tablet  Commonly known as: HALCION  Take 0.5 mg by mouth at bedtime.     vitamin B-12 500 MCG tablet  Commonly known as: CYANOCOBALAMIN  Take 500 mcg by mouth daily.             Relevant Imaging Results:  Relevant Lab Results:   Additional Information Pt will have home health PT through [place].   Dede Query, LCSW

## 2015-06-23 NOTE — Progress Notes (Signed)
Pt for discharge back to the [facility]. Son in Social worker  Picked pt up. Discharge instructions to son. presc and home meds  / diet/ activity discussed.  chf symptoms discussed. Verbalize understanding. No c/o.

## 2015-06-23 NOTE — Discharge Instructions (Signed)
DIET:  Cardiac, diabetic diet  DISCHARGE CONDITION:  Stable  ACTIVITY:  Activity as tolerated  OXYGEN:  Home Oxygen: No.   Oxygen Delivery: room air  DISCHARGE LOCATION:  assited living    ADDITIONAL DISCHARGE INSTRUCTION:   If you experience worsening of your admission symptoms, develop shortness of breath, life threatening emergency, suicidal or homicidal thoughts you must seek medical attention immediately by calling 911 or calling your MD immediately  if symptoms less severe.  You Must read complete instructions/literature along with all the possible adverse reactions/side effects for all the Medicines you take and that have been prescribed to you. Take any new Medicines after you have completely understood and accpet all the possible adverse reactions/side effects.   Please note  You were cared for by a hospitalist during your hospital stay. If you have any questions about your discharge medications or the care you received while you were in the hospital after you are discharged, you can call the unit and asked to speak with the hospitalist on call if the hospitalist that took care of you is not available. Once you are discharged, your primary care physician will handle any further medical issues. Please note that NO REFILLS for any discharge medications will be authorized once you are discharged, as it is imperative that you return to your primary care physician (or establish a relationship with a primary care physician if you do not have one) for your aftercare needs so that they can reassess your need for medications and monitor your lab values.       Heart Failure [facility] appointment on July 05, 2015 at 12:30pm with Clarisa Kindred, FNP. Please call [phone] to reschedule.  Community-Acquired Pneumonia, Adult Pneumonia is an infection of the lungs. One type of pneumonia can happen while a person is in a hospital. A different type can happen when a person is not in a  hospital (community-acquired pneumonia). It is easy for this kind to spread from person to person. It can spread to you if you breathe near an infected person who coughs or sneezes. Some symptoms include:  A dry cough.  A wet (productive) cough.  Fever.  Sweating.  Chest pain. HOME CARE  Take over-the-counter and prescription medicines only as told by your doctor.  Only take cough medicine if you are losing sleep.  If you were prescribed an antibiotic medicine, take it as told by your doctor. Do not stop taking the antibiotic even if you start to feel better.  Sleep with your head and neck raised (elevated). You can do this by putting a few pillows under your head, or you can sleep in a recliner.  Do not use tobacco products. These include cigarettes, chewing tobacco, and e-cigarettes. If you need help quitting, ask your doctor.  Drink enough water to keep your pee (urine) clear or pale yellow. A shot (vaccine) can help prevent pneumonia. Shots are often suggested for:  People older than 80 years of age.  People older than 80 years of age:  Who are having cancer treatment.  Who have long-term (chronic) lung disease.  Who have problems with their body's defense system (immune system). You may also prevent pneumonia if you take these actions:  Get the flu (influenza) shot every year.  Go to the dentist as often as told.  Wash your hands often. If soap and water are not available, use hand sanitizer. GET HELP IF:  You have a fever.  You lose sleep because your cough medicine  does not help. GET HELP RIGHT AWAY IF:  You are short of breath and it gets worse.  You have more chest pain.  Your sickness gets worse. This is very serious if:  You are an older adult.  Your body's defense system is weak.  You cough up blood.   This information is not intended to replace advice given to you by your health care provider. Make sure you discuss any questions you have with  your health care provider.   Document Released: 10/09/2007 Document Revised: 01/11/2015 Document Reviewed: 08/17/2014 Elsevier Interactive Patient Education [employer]

## 2015-06-23 NOTE — Progress Notes (Signed)
Anticoagulation Monitoring  Patient is a 83 yo male with orders for Xarelto 20 mg po daily.  Patient's est CrCl~43 mL/min based on IBW; however studies used to determine CrCl cut off used actual body weight. CrCl using actual body weight is 53 ml/min.   Will change back to Xarelto 20 mg PO daily. Will continue to follow renal function and CBCs per policy. If CrCl falls below 50 ml/min, will reduce dose to Xarelto 15 mg PO daily.   Hgb: 12, Plts: 243  Demetrius Charity, PharmD  Clinical Pharmacist 06/23/2015

## 2015-06-23 NOTE — Care Management (Addendum)
Message left for patient's daughter Archie Patten to contact this RNCM to discuss discharge planning as PT is recommending HHPT. I will attempt to meet also with patient. Archie Patten called this RNCM back stating that patient lives at the [facility]. She said DO NOT LET HIM SIGN ANYTHING BECAUSE HE CANNOT READ. She agrees to PT at the ALF. He has a walker and a wheelchair at the facility. I will continue to follow for PT. Referral made to [facility]. No further RNCM needs. Case closed.

## 2015-06-23 NOTE — Progress Notes (Signed)
Physical Therapy Treatment Patient Details Name: Martin [name] MRN: [id number] DOB: Jul 17, 1921 Today's Date: 06/23/2015    History of Present Illness Patient is a 82 y/o male that presents with shortness of breath, found to have multi-lobar pneumonia. Patient lives at the [facility].     PT Comments    Patient tolerating session on RA, maintains sats/HR [facility] throughout session.  Able to complete all transfers and increased gait distance (80') with RW, no greater than cga throughout session.  Safe for return home with HHPT follow up at discharge.   Follow Up Recommendations  Home health PT     Equipment Recommendations       Recommendations for Other Services       Precautions / Restrictions Precautions Precautions: Fall Restrictions Weight Bearing Restrictions: No    Mobility  Bed Mobility               General bed mobility comments: seated in recliner beginning/end of session  Transfers Overall transfer level: Needs assistance Equipment used: Rolling walker (2 wheeled) Transfers: Sit to/from Stand Sit to Stand: Supervision;Min guard         General transfer comment: requires UE support to complete movement transition  Ambulation/Gait Ambulation/Gait assistance: Min guard Ambulation Distance (Feet): 80 Feet Assistive device: Rolling walker (2 wheeled)       General Gait Details: reciprocal stepping pattern with fair step height/length, no buckling or LOB.  Slow, but steady, cadence and overall gait performance.  Vitals stable and WFL on RA; BORG 7-8/10 after above distance.  Patient able to self-regulate and initiate rest periods as needed.   Stairs            Wheelchair Mobility    Modified Rankin (Stroke Patients Only)       Balance Overall balance assessment: Needs assistance Sitting-balance support: No upper extremity supported;Feet supported Sitting balance-Leahy Scale: Good     Standing balance support: Bilateral upper  extremity supported Standing balance-Leahy Scale: Fair                      Cognition Arousal/Alertness: Awake/alert Behavior During Therapy: WFL for tasks assessed/performed Overall Cognitive Status: History of cognitive impairments - at baseline (very HOH)                      Exercises Other Exercises Other Exercises: Standing LE therex, 1x10, AROM for muscular strength/endurance/balance with RW, cga: heel raises, mini squats, hip flex/ext/abduct/adduct.    General Comments        Pertinent Vitals/Pain Pain Assessment: No/denies pain    Home Living                      Prior Function            PT Goals (current goals can now be found in the care plan section) Acute Rehab PT Goals Patient Stated Goal: To return home PT Goal Formulation: With patient Time For Goal Achievement: 07/05/15 Potential to Achieve Goals: Good Progress towards PT goals: Progressing toward goals    Frequency  Min 2X/week    PT Plan Current plan remains appropriate    Co-evaluation             End of Session Equipment Utilized During Treatment: Gait belt Activity Tolerance: Patient tolerated treatment well Patient left: in chair;with call bell/phone within reach;with chair alarm set     Time: 4098-1191 PT Time Calculation (min) (ACUTE ONLY): 18  min  Charges:  $Gait Training: 8-22 mins                    G Codes:      Pierrette Scheu H. Manson Passey, PT, DPT, NCS 06/23/2015, 10:19 AM [phone]

## 2015-06-23 NOTE — Progress Notes (Signed)
Spoke with Shanda Bumps, [facility] rep at [phone], to notify of non-emergent EMS transport.  Auth notification reference given as [id number].   Service date range good from 06/23/15 - 09/20/15.   Gap exception requested to determine if services can be considered at an in-network level.

## 2015-06-23 NOTE — Discharge Summary (Addendum)
Martin [name], 82 y.o., DOB 1921-10-22, MRN [id number]. Admission date: 06/20/2015 Discharge Date 06/23/2015 Primary MD Pcp Not In System Admitting Physician Milagros Loll, MD  Admission Diagnosis multifocal pneumonia   Discharge Diagnosis   Active Problems:   Multifocal pneumonia  community-acquired Coronary artery disease Chronic systolic congestive heart failure without evidence of acute exasperation Hyperlipidemia Diabetes type 2 Atrial fibrillation  BPH Peripheral vascular Chronic kidney disease     Hospital CourseWalter Tester is a 82 y.o. male with a known history of hypertension, diabetes, chronic systolic CHF presents to the hospital due to shortness of breath, hypoxia, right lower chest pain. He was seen in the ED and evaluated with CT scan of the chest. Which showed multifocal pneumonia. He was admitted and was treated with IV antibiotics. Patient's breathing is now back to baseline he is on room air. He was also treated with steroids for bronchospasm which has since resolved. He was seen by physical therapy they recommended physical therapy and home health at the assisted living facility.    ACCUCKECKS QAC + QHS       Consults  None  Significant Tests:  See full reports for all details      Ct Angio Chest Pe W/cm &/or Wo Cm  06/20/2015  CLINICAL DATA:  Right-sided chest and abdominal pain EXAM: CT ANGIOGRAPHY CHEST AND ABDOMEN TECHNIQUE: Multidetector CT imaging of the chest and abdomen was performed using the standard protocol during bolus administration of intravenous contrast. Multiplanar CT image reconstructions and MIPs were obtained to evaluate the vascular anatomy. CONTRAST:  OMNIPAQUE IOHEXOL 350 MG/ML SOLN COMPARISON:  10/13/2008. FINDINGS: CTA CHEST FINDINGS The lungs are well aerated bilaterally and demonstrate patchy infiltrative changes throughout the right lung as well as the left lower lobe. No sizable effusion is seen. No pneumothorax is noted.  The thoracic inlet shows no focal abnormality. Aortic calcifications are seen without aneurysmal dilatation or dissection. Changes of prior bypass grafting are seen. The pulmonary artery is well visualized and shows no evidence of pulmonary emboli. Scattered mediastinal lymph nodes are noted which demonstrate normal fatty hila. Degenerative changes of the thoracic spine are noted. Changes of prior median sternotomy are seen. Review of the MIP images confirms the above findings. CTA ABDOMEN FINDINGS The liver, gallbladder, spleen, adrenal glands and pancreas are within normal limits with the exception of a small hypodensity measuring 18 mm in the tail of the pancreas. This lesion has enlarged slightly in the interval from 10/13/2008. The kidneys demonstrate a normal enhancement pattern. No calculi or obstructive changes are noted. The abdominal aorta demonstrates diffuse calcifications although no aneurysmal dilatation or dissection is seen. The visceral vessels are patent. Single renal artery is noted on the left. Dual renal artery on the right. Scattered atherosclerotic changes are noted without focal stenosis. Right common iliac and external iliac stenting is noted. Chronic compression deformity of L1 is noted. No other focal abnormality is seen. Review of the MIP images confirms the above findings. IMPRESSION: CTA of the chest: Patchy infiltrates throughout the right lung and left lower lobe consistent with acute pneumonia. This may be related to the patient's clinical symptomatology. No evidence of pulmonary emboli. No definitive aortic abnormality is seen. CTA of the abdomen: No acute aortic abnormality is seen. Diffuse atherosclerotic changes are noted. Likely cyst in the tail of the pancreas which is increased from 10 to 18 mm in the interval from 2010. Electronically Signed   By: Alcide Clever M.D.   On: 06/20/2015 11:09  Ct Angio Abdomen W/cm &/or Wo Contrast  06/20/2015  CLINICAL DATA:  Right-sided  chest and abdominal pain EXAM: CT ANGIOGRAPHY CHEST AND ABDOMEN TECHNIQUE: Multidetector CT imaging of the chest and abdomen was performed using the standard protocol during bolus administration of intravenous contrast. Multiplanar CT image reconstructions and MIPs were obtained to evaluate the vascular anatomy. CONTRAST:  OMNIPAQUE IOHEXOL 350 MG/ML SOLN COMPARISON:  10/13/2008. FINDINGS: CTA CHEST FINDINGS The lungs are well aerated bilaterally and demonstrate patchy infiltrative changes throughout the right lung as well as the left lower lobe. No sizable effusion is seen. No pneumothorax is noted. The thoracic inlet shows no focal abnormality. Aortic calcifications are seen without aneurysmal dilatation or dissection. Changes of prior bypass grafting are seen. The pulmonary artery is well visualized and shows no evidence of pulmonary emboli. Scattered mediastinal lymph nodes are noted which demonstrate normal fatty hila. Degenerative changes of the thoracic spine are noted. Changes of prior median sternotomy are seen. Review of the MIP images confirms the above findings. CTA ABDOMEN FINDINGS The liver, gallbladder, spleen, adrenal glands and pancreas are within normal limits with the exception of a small hypodensity measuring 18 mm in the tail of the pancreas. This lesion has enlarged slightly in the interval from 10/13/2008. The kidneys demonstrate a normal enhancement pattern. No calculi or obstructive changes are noted. The abdominal aorta demonstrates diffuse calcifications although no aneurysmal dilatation or dissection is seen. The visceral vessels are patent. Single renal artery is noted on the left. Dual renal artery on the right. Scattered atherosclerotic changes are noted without focal stenosis. Right common iliac and external iliac stenting is noted. Chronic compression deformity of L1 is noted. No other focal abnormality is seen. Review of the MIP images confirms the above findings. IMPRESSION:  CTA of the chest: Patchy infiltrates throughout the right lung and left lower lobe consistent with acute pneumonia. This may be related to the patient's clinical symptomatology. No evidence of pulmonary emboli. No definitive aortic abnormality is seen. CTA of the abdomen: No acute aortic abnormality is seen. Diffuse atherosclerotic changes are noted. Likely cyst in the tail of the pancreas which is increased from 10 to 18 mm in the interval from 2010. Electronically Signed   By: Alcide Clever M.D.   On: 06/20/2015 11:09   Dg Chest Portable 1 View  06/20/2015  CLINICAL DATA:  Right-sided chest pain, initial encounter EXAM: PORTABLE CHEST 1 VIEW COMPARISON:  03/14/2015 FINDINGS: Cardiac shadow is again enlarged. Postsurgical changes are again seen. The lungs are well aerated with improved aeration in the bases bilaterally. No bony abnormality is seen. IMPRESSION: No acute abnormality noted. The overall appearance is improved from the prior study. Electronically Signed   By: Alcide Clever M.D.   On: 06/20/2015 08:01       Today   Subjective:   Lavon Bothwell  feels better shortness of breath resolved cough resolved  Objective:   Blood pressure 152/81, pulse 74, temperature 98 F (36.7 C), temperature source Oral, resp. rate 17, height 6' (1.829 m), weight 108.682 kg (239 lb 9.6 oz), SpO2 97 %.  .  Intake/Output Summary (Last 24 hours) at 06/23/15 1123 Last data filed at 06/23/15 0900  Gross per 24 hour  Intake    480 ml  Output   1200 ml  Net   -720 ml    Exam VITAL SIGNS: Blood pressure 152/81, pulse 74, temperature 98 F (36.7 C), temperature source Oral, resp. rate 17, height 6' (1.829 m), weight  108.682 kg (239 lb 9.6 oz), SpO2 97 %.  GENERAL:  82 y.o.-year-old patient lying in the bed with no acute distress.  EYES: Pupils equal, round, reactive to light and accommodation. No scleral icterus. Extraocular muscles intact.  HEENT: Head atraumatic, normocephalic. Oropharynx and nasopharynx  clear.  NECK:  Supple, no jugular venous distention. No thyroid enlargement, no tenderness.  LUNGS: Normal breath sounds bilaterally, no wheezing, rales,rhonchi or crepitation. No use of accessory muscles of respiration.  CARDIOVASCULAR: S1, S2 normal. No murmurs, rubs, or gallops.  ABDOMEN: Soft, nontender, nondistended. Bowel sounds present. No organomegaly or mass.  EXTREMITIES: No pedal edema, cyanosis, or clubbing.  NEUROLOGIC: Cranial nerves II through XII are intact. Muscle strength 5/5 in all extremities. Sensation intact. Gait not checked.  PSYCHIATRIC: The patient is alert and oriented x 3.  SKIN: No obvious rash, lesion, or ulcer.   Data Review     CBC w Diff: Lab Results  Component Value Date   WBC 13.1* 06/23/2015   WBC 22.8* 05/18/2014   HGB 12.2* 06/23/2015   HGB 11.9* 05/18/2014   HCT 37.7* 06/23/2015   HCT 35.8* 05/18/2014   PLT 243 06/23/2015   PLT 216 05/18/2014   LYMPHOPCT 7 03/14/2015   LYMPHOPCT 8.1 05/18/2014   MONOPCT 8 03/14/2015   MONOPCT 5.5 05/18/2014   EOSPCT 0 03/14/2015   EOSPCT 0.4 05/18/2014   BASOPCT 0 03/14/2015   BASOPCT 0.4 05/18/2014   CMP: Lab Results  Component Value Date   NA 142 06/22/2015   NA 140 05/20/2014   K 4.0 06/22/2015   K 4.4 05/20/2014   CL 107 06/22/2015   CL 110* 05/20/2014   CO2 26 06/22/2015   CO2 26 05/20/2014   BUN 35* 06/22/2015   BUN 34* 05/20/2014   CREATININE 1.40* 06/22/2015   CREATININE 1.79* 05/20/2014   PROT 7.1 06/20/2015   PROT 7.9 05/17/2014   ALBUMIN 3.6 06/20/2015   ALBUMIN 3.7 05/17/2014   BILITOT 0.7 06/20/2015   BILITOT 0.3 05/17/2014   ALKPHOS 86 06/20/2015   ALKPHOS 83 05/17/2014   AST 24 06/20/2015   AST 19 05/17/2014   ALT 19 06/20/2015   ALT 29 05/17/2014  .  Micro Results Recent Results (from the past 240 hour(s))  Blood culture (routine x 2)     Status: None (Preliminary result)   Collection Time: 06/20/15 11:35 AM  Result Value Ref Range Status   Specimen Description  BLOOD RIGHT ASSIST CONTROL  Final   Special Requests BOTTLES DRAWN AEROBIC AND ANAEROBIC  1CC  Final   Culture NO GROWTH 2 DAYS  Final   Report Status PENDING  Incomplete  Blood culture (routine x 2)     Status: None (Preliminary result)   Collection Time: 06/20/15 11:35 AM  Result Value Ref Range Status   Specimen Description BLOOD LEFT HAND  Final   Special Requests   Final    BOTTLES DRAWN AEROBIC AND ANAEROBIC  AERO 4CC ANA 6CC   Culture NO GROWTH 2 DAYS  Final   Report Status PENDING  Incomplete  MRSA PCR Screening     Status: None   Collection Time: 06/20/15  4:20 PM  Result Value Ref Range Status   MRSA by PCR NEGATIVE NEGATIVE Final    Comment:        The GeneXpert MRSA Assay (FDA approved for NASAL specimens only), is one component of a comprehensive MRSA colonization surveillance program. It is not intended to diagnose MRSA infection nor to guide or  monitor treatment for MRSA infections.   Culture, sputum-assessment     Status: None   Collection Time: 06/21/15 11:23 AM  Result Value Ref Range Status   Specimen Description EXPECTORATED SPUTUM  Final   Special Requests Normal  Final   Sputum evaluation THIS SPECIMEN IS ACCEPTABLE FOR SPUTUM CULTURE  Final   Report Status 06/21/2015 FINAL  Final  Culture, respiratory (NON-Expectorated)     Status: None (Preliminary result)   Collection Time: 06/21/15 11:23 AM  Result Value Ref Range Status   Specimen Description EXPECTORATED SPUTUM  Final   Special Requests Normal Reflexed from [id number]  Final   Gram Stain   Final    MANY WBC SEEN FEW SQUAMOUS EPITHELIAL CELLS PRESENT FEW GRAM POSITIVE RODS FEW GRAM NEGATIVE RODS RARE GRAM POSITIVE COCCI GOOD SPECIMEN - 80-90% WBCS    Culture HOLDING FOR POSSIBLE PATHOGEN  Final   Report Status PENDING  Incomplete        Code Status Orders        Start     Ordered   06/20/15 1204  Full code   Continuous     06/20/15 1204    Code Status History    Date Active Date  Inactive Code Status Order ID Comments User Context   03/14/2015 10:19 PM 03/18/2015  5:46 PM Full Code [id number]  Wyatt Haste, MD ED   10/02/2014  3:05 AM 10/02/2014  5:53 PM Full Code [id number]  Gale Journey, MD Inpatient   09/10/2014 12:05 PM 09/12/2014  9:31 PM Full Code [id number]  Altamese Dilling, MD Inpatient          Follow-up Information    Follow up with Delma Freeze, FNP. Go on 07/05/2015.   Specialty:  Family Medicine   Why:  at 12:30pm , to [facility]   Contact information:   [address] [place] [address] [phone]       Follow up with pcp In 7 days.      Discharge Medications     Medication List    TAKE these medications        albuterol 108 (90 Base) MCG/ACT inhaler  Commonly known as:  PROVENTIL HFA;VENTOLIN HFA  Inhale 2 puffs into the lungs every 6 (six) hours as needed for wheezing or shortness of breath.     aspirin 325 MG tablet  Take 325 mg by mouth daily.     dextrose 40 % Gel  Commonly known as:  GLUTOSE  Take 1 Tube by mouth as needed for low blood sugar.     docusate sodium 100 MG capsule  Commonly known as:  COLACE  Take 100 mg by mouth 2 (two) times daily.     furosemide 40 MG tablet  Commonly known as:  LASIX  Take 1 tablet (40 mg total) by mouth daily.     gabapentin 300 MG capsule  Commonly known as:  NEURONTIN  Take 300-600 mg by mouth 3 (three) times daily. Take 1 capsule at 0900 & 1400 and 2 capsules at 2000.     guaiFENesin-dextromethorphan 100-10 MG/5ML syrup  Commonly known as:  ROBITUSSIN DM  Take 15 mLs by mouth every 4 (four) hours as needed for cough.     dextromethorphan-guaiFENesin 30-600 MG 12hr tablet  Commonly known as:  MUCINEX DM  Take 1 tablet by mouth every 12 (twelve) hours.     insulin aspart 100 UNIT/ML injection  Commonly known as:  novoLOG  Inject 3 Units into the skin 3 (three) times daily with meals.     insulin glargine 100 UNIT/ML injection   Commonly known as:  LANTUS  Inject 0.32 mLs (32 Units total) into the skin daily.     ipratropium-albuterol 0.5-2.5 (3) MG/3ML Soln  Commonly known as:  DUONEB  Take 3 mLs by nebulization every 6 (six) hours as needed.     levofloxacin 500 MG tablet  Commonly known as:  LEVAQUIN  Take 1 tablet (500 mg total) by mouth daily.     nitroGLYCERIN 0.4 MG SL tablet  Commonly known as:  NITROSTAT  Place 1 tablet (0.4 mg total) under the tongue every 5 (five) minutes as needed for chest pain.     oxyCODONE-acetaminophen 5-325 MG tablet  Commonly known as:  PERCOCET/ROXICET  Take 0.5 tablets by mouth at bedtime. Pt also takes one-half tablet every six hours as needed for moderate/severe pain.     polyethylene glycol packet  Commonly known as:  MIRALAX / GLYCOLAX  Take 17 g by mouth every Monday, Wednesday, and Friday.     pravastatin 20 MG tablet  Commonly known as:  PRAVACHOL  Take 20 mg by mouth at bedtime.     rivaroxaban 20 MG Tabs tablet  Commonly known as:  XARELTO  Take 20 mg by mouth daily.     terazosin 2 MG capsule  Commonly known as:  HYTRIN  Take 2 mg by mouth at bedtime.     triazolam 0.25 MG tablet  Commonly known as:  HALCION  Take 0.5 mg by mouth at bedtime.     vitamin B-12 500 MCG tablet  Commonly known as:  CYANOCOBALAMIN  Take 500 mcg by mouth daily.           Total Time in preparing paper work, data evaluation and todays exam - 35 minutes  Auburn Bilberry M.D on 06/23/2015 at 11:23 AM  [facility]   Office  [phone]

## 2015-06-24 LAB — CULTURE, RESPIRATORY
CULTURE: NORMAL
SPECIAL REQUESTS: NORMAL

## 2015-06-24 LAB — CULTURE, RESPIRATORY W GRAM STAIN

## 2015-06-25 LAB — CULTURE, BLOOD (ROUTINE X 2)
CULTURE: NO GROWTH
Culture: NO GROWTH

## 2015-06-26 LAB — MISC LABCORP TEST (SEND OUT): LABCORP TEST CODE: 9985

## 2015-07-05 ENCOUNTER — Ambulatory Visit: Payer: Self-pay | Admitting: Family

## 2015-07-26 ENCOUNTER — Other Ambulatory Visit: Payer: Self-pay | Admitting: Specialist

## 2015-07-26 DIAGNOSIS — R1310 Dysphagia, unspecified: Secondary | ICD-10-CM

## 2015-07-27 ENCOUNTER — Emergency Department: Payer: Medicare Other

## 2015-07-27 ENCOUNTER — Encounter: Payer: Self-pay | Admitting: Emergency Medicine

## 2015-07-27 ENCOUNTER — Inpatient Hospital Stay
Admission: EM | Admit: 2015-07-27 | Discharge: 2015-07-31 | DRG: 190 | Disposition: A | Discharge status: Home / Self Care | Payer: Medicare Other | Attending: Internal Medicine | Admitting: Internal Medicine

## 2015-07-27 DIAGNOSIS — Z833 Family history of diabetes mellitus: Secondary | ICD-10-CM

## 2015-07-27 DIAGNOSIS — I5022 Chronic systolic (congestive) heart failure: Secondary | ICD-10-CM | POA: Diagnosis present

## 2015-07-27 DIAGNOSIS — I482 Chronic atrial fibrillation, unspecified: Secondary | ICD-10-CM | POA: Diagnosis present

## 2015-07-27 DIAGNOSIS — E1169 Type 2 diabetes mellitus with other specified complication: Secondary | ICD-10-CM

## 2015-07-27 DIAGNOSIS — E785 Hyperlipidemia, unspecified: Secondary | ICD-10-CM | POA: Diagnosis present

## 2015-07-27 DIAGNOSIS — K5900 Constipation, unspecified: Secondary | ICD-10-CM

## 2015-07-27 DIAGNOSIS — J9601 Acute respiratory failure with hypoxia: Secondary | ICD-10-CM | POA: Diagnosis present

## 2015-07-27 DIAGNOSIS — I739 Peripheral vascular disease, unspecified: Secondary | ICD-10-CM | POA: Diagnosis present

## 2015-07-27 DIAGNOSIS — I2510 Atherosclerotic heart disease of native coronary artery without angina pectoris: Secondary | ICD-10-CM | POA: Diagnosis present

## 2015-07-27 DIAGNOSIS — J45909 Unspecified asthma, uncomplicated: Secondary | ICD-10-CM | POA: Diagnosis present

## 2015-07-27 DIAGNOSIS — J440 Chronic obstructive pulmonary disease with acute lower respiratory infection: Secondary | ICD-10-CM | POA: Diagnosis not present

## 2015-07-27 DIAGNOSIS — Z7982 Long term (current) use of aspirin: Secondary | ICD-10-CM

## 2015-07-27 DIAGNOSIS — J441 Chronic obstructive pulmonary disease with (acute) exacerbation: Secondary | ICD-10-CM | POA: Diagnosis present

## 2015-07-27 DIAGNOSIS — Z79899 Other long term (current) drug therapy: Secondary | ICD-10-CM

## 2015-07-27 DIAGNOSIS — Z951 Presence of aortocoronary bypass graft: Secondary | ICD-10-CM

## 2015-07-27 DIAGNOSIS — N400 Enlarged prostate without lower urinary tract symptoms: Secondary | ICD-10-CM | POA: Diagnosis present

## 2015-07-27 DIAGNOSIS — J189 Pneumonia, unspecified organism: Secondary | ICD-10-CM | POA: Diagnosis present

## 2015-07-27 DIAGNOSIS — T380X5A Adverse effect of glucocorticoids and synthetic analogues, initial encounter: Secondary | ICD-10-CM | POA: Diagnosis not present

## 2015-07-27 DIAGNOSIS — Z794 Long term (current) use of insulin: Secondary | ICD-10-CM

## 2015-07-27 DIAGNOSIS — Z7901 Long term (current) use of anticoagulants: Secondary | ICD-10-CM

## 2015-07-27 DIAGNOSIS — I130 Hypertensive heart and chronic kidney disease with heart failure and stage 1 through stage 4 chronic kidney disease, or unspecified chronic kidney disease: Secondary | ICD-10-CM | POA: Diagnosis present

## 2015-07-27 DIAGNOSIS — Z6834 Body mass index (BMI) 34.0-34.9, adult: Secondary | ICD-10-CM

## 2015-07-27 DIAGNOSIS — E669 Obesity, unspecified: Secondary | ICD-10-CM | POA: Diagnosis present

## 2015-07-27 DIAGNOSIS — R0602 Shortness of breath: Secondary | ICD-10-CM | POA: Diagnosis not present

## 2015-07-27 DIAGNOSIS — E1122 Type 2 diabetes mellitus with diabetic chronic kidney disease: Secondary | ICD-10-CM | POA: Diagnosis present

## 2015-07-27 DIAGNOSIS — Y95 Nosocomial condition: Secondary | ICD-10-CM | POA: Diagnosis present

## 2015-07-27 DIAGNOSIS — N183 Chronic kidney disease, stage 3 unspecified: Secondary | ICD-10-CM | POA: Diagnosis present

## 2015-07-27 HISTORY — DX: Unspecified asthma, uncomplicated: J45.909

## 2015-07-27 HISTORY — DX: Disorder of kidney and ureter, unspecified: N28.9

## 2015-07-27 LAB — CBC
HEMATOCRIT: 36.9 % — AB (ref 40.0–52.0)
HEMOGLOBIN: 12.1 g/dL — AB (ref 13.0–18.0)
MCH: 29.6 pg (ref 26.0–34.0)
MCHC: 32.8 g/dL (ref 32.0–36.0)
MCV: 90.3 fL (ref 80.0–100.0)
Platelets: 211 10*3/uL (ref 150–440)
RBC: 4.09 MIL/uL — ABNORMAL LOW (ref 4.40–5.90)
RDW: 14.3 % (ref 11.5–14.5)
WBC: 10 10*3/uL (ref 3.8–10.6)

## 2015-07-27 LAB — BASIC METABOLIC PANEL
ANION GAP: 8 (ref 5–15)
BUN: 27 mg/dL — AB (ref 6–20)
CHLORIDE: 103 mmol/L (ref 101–111)
CO2: 26 mmol/L (ref 22–32)
Calcium: 8.4 mg/dL — ABNORMAL LOW (ref 8.9–10.3)
Creatinine, Ser: 1.44 mg/dL — ABNORMAL HIGH (ref 0.61–1.24)
GFR calc Af Amer: 47 mL/min — ABNORMAL LOW (ref >60)
GFR, EST NON AFRICAN AMERICAN: 41 mL/min — AB (ref >60)
GLUCOSE: 333 mg/dL — AB (ref 65–99)
POTASSIUM: 3.2 mmol/L — AB (ref 3.5–5.1)
Sodium: 137 mmol/L (ref 135–145)

## 2015-07-27 LAB — TROPONIN I: Troponin I: 0.03 ng/mL (ref <0.031)

## 2015-07-27 MED ORDER — IPRATROPIUM-ALBUTEROL 0.5-2.5 (3) MG/3ML IN SOLN
3.0000 mL | Freq: Once | RESPIRATORY_TRACT | Status: AC
  Administered 2015-07-27: 3 mL via RESPIRATORY_TRACT

## 2015-07-27 MED ORDER — METHYLPREDNISOLONE SODIUM SUCC 125 MG IJ SOLR
INTRAMUSCULAR | Status: AC
  Administered 2015-07-27: 125 mg via INTRAVENOUS
  Filled 2015-07-27: qty 2

## 2015-07-27 MED ORDER — DEXTROSE 5 % IV SOLN
1.0000 g | Freq: Once | INTRAVENOUS | Status: AC
  Administered 2015-07-28: 1 g via INTRAVENOUS
  Filled 2015-07-27: qty 10

## 2015-07-27 MED ORDER — SODIUM CHLORIDE 0.9 % IV BOLUS (SEPSIS)
250.0000 mL | Freq: Once | INTRAVENOUS | Status: AC
  Administered 2015-07-27: 250 mL via INTRAVENOUS

## 2015-07-27 MED ORDER — METHYLPREDNISOLONE SODIUM SUCC 125 MG IJ SOLR
125.0000 mg | Freq: Once | INTRAMUSCULAR | Status: AC
  Administered 2015-07-27: 125 mg via INTRAVENOUS

## 2015-07-27 MED ORDER — DEXTROSE 5 % IV SOLN
500.0000 mg | Freq: Once | INTRAVENOUS | Status: AC
  Administered 2015-07-28: 500 mg via INTRAVENOUS
  Filled 2015-07-27: qty 500

## 2015-07-27 MED ORDER — IPRATROPIUM-ALBUTEROL 0.5-2.5 (3) MG/3ML IN SOLN
RESPIRATORY_TRACT | Status: AC
  Administered 2015-07-27: 3 mL via RESPIRATORY_TRACT
  Filled 2015-07-27: qty 3

## 2015-07-27 MED ORDER — DIATRIZOATE MEGLUMINE & SODIUM 66-10 % PO SOLN
15.0000 mL | Freq: Once | ORAL | Status: AC
  Administered 2015-07-27: 25 mL via ORAL

## 2015-07-27 NOTE — ED Provider Notes (Signed)
[facility] Emergency Department Provider Note  ____________________________________________  Time seen: Approximately 11:04 PM  I have reviewed the triage vital signs and the nursing notes.   HISTORY  Chief Complaint Shortness of Breath    HPI Martin [name] is a 82 y.o. male with hypertension, diabetes, chronic systolic CHF, CAD, afib who presents for evaluation of shortness of breath this evening, worse with exertion, currently moderate, improves with use of inhalers. Patient reports that early this evening he became very short of breath when he was walking. On EMS arrival, he received 2 DuoNeb treatments and he feels mildly improved. He is also complaining of some abdominal distention and left-sided abdominal pain with constipation which is been ongoing for the past few days. He is concerned that his abdominal swelling is what causing him to be so short of breath. He denies any chest pain. No nausea or vomiting. No fevers. No cough.   Past Medical History  Diagnosis Date  . Coronary artery disease   . CHF (congestive heart failure) (HCC)     ischemic cardiomyopathy- EF 25%  . Hyperlipidemia   . Diabetes mellitus without complication (HCC)   . Atrial fibrillation (HCC)   . BPH (benign prostatic hyperplasia)   . Peripheral vascular disease (HCC)   . Chronic kidney disease   . Insomnia   . Renal insufficiency   . Asthma     Patient Active Problem List   Diagnosis Date Noted  . HCAP (healthcare-associated pneumonia) 06/20/2015  . Sepsis (HCC) 03/14/2015  . Diabetes mellitus type 2 in obese (HCC) 10/01/2014  . Chronic atrial fibrillation (HCC) 10/01/2014  . Chronic kidney disease, stage III (moderate) 10/01/2014  . Chronic systolic (congestive) heart failure (HCC) 10/01/2014  . Peripheral vascular disease (HCC) 10/01/2014  . Coronary artery disease 10/01/2014    Past Surgical History  Procedure Laterality Date  . Cardiac surgery    . Coronary  artery bypass graft      Current Outpatient Rx  Name  Route  Sig  Dispense  Refill  . albuterol (PROVENTIL HFA;VENTOLIN HFA) 108 (90 BASE) MCG/ACT inhaler   Inhalation   Inhale 2 puffs into the lungs every 6 (six) hours as needed for wheezing or shortness of breath.   1 Inhaler   2   . aspirin 325 MG tablet   Oral   Take 325 mg by mouth daily.          Marland Kitchen dextromethorphan-guaiFENesin (MUCINEX DM) 30-600 MG 12hr tablet   Oral   Take 1 tablet by mouth every 12 (twelve) hours.         Marland Kitchen dextrose (GLUTOSE) 40 % GEL   Oral   Take 1 Tube by mouth as needed for low blood sugar.         . docusate sodium (COLACE) 100 MG capsule   Oral   Take 100 mg by mouth 2 (two) times daily.         . furosemide (LASIX) 40 MG tablet   Oral   Take 1 tablet (40 mg total) by mouth daily. Patient taking differently: Take 40 mg by mouth daily. Take 1 tablet daily Monday-Friday and 1 tablet twice daily on Saturday and Sunday.   30 tablet   0   . gabapentin (NEURONTIN) 300 MG capsule   Oral   Take 300-600 mg by mouth 3 (three) times daily. Take 1 capsule at 0900 & 1400 and 2 capsules at 2000.         Marland Kitchen  guaiFENesin-dextromethorphan (ROBITUSSIN DM) 100-10 MG/5ML syrup   Oral   Take 15 mLs by mouth every 4 (four) hours as needed for cough.         . insulin aspart (NOVOLOG) 100 UNIT/ML injection   Subcutaneous   Inject 3 Units into the skin 3 (three) times daily with meals.   10 mL   11   . insulin glargine (LANTUS) 100 UNIT/ML injection   Subcutaneous   Inject 0.32 mLs (32 Units total) into the skin daily. Patient taking differently: Inject 34 Units into the skin daily.    10 mL   11   . ipratropium-albuterol (DUONEB) 0.5-2.5 (3) MG/3ML SOLN   Nebulization   Take 3 mLs by nebulization every 6 (six) hours as needed.   360 mL   0   . nitroGLYCERIN (NITROSTAT) 0.4 MG SL tablet   Sublingual   Place 1 tablet (0.4 mg total) under the tongue every 5 (five) minutes as needed for  chest pain.   30 tablet   1   . oxyCODONE-acetaminophen (PERCOCET/ROXICET) 5-325 MG tablet   Oral   Take 0.5 tablets by mouth at bedtime. Pt also takes one-half tablet every six hours as needed for moderate/severe pain.   30 tablet   0   . polyethylene glycol (MIRALAX / GLYCOLAX) packet   Oral   Take 17 g by mouth every Monday, Wednesday, and Friday.          . pravastatin (PRAVACHOL) 20 MG tablet   Oral   Take 20 mg by mouth at bedtime.          . rivaroxaban (XARELTO) 20 MG TABS tablet   Oral   Take 20 mg by mouth daily.         Marland Kitchen. terazosin (HYTRIN) 2 MG capsule   Oral   Take 2 mg by mouth at bedtime.          . triazolam (HALCION) 0.25 MG tablet   Oral   Take 0.5 mg by mouth at bedtime.          . vitamin B-12 (CYANOCOBALAMIN) 500 MCG tablet   Oral   Take 500 mcg by mouth daily.         Marland Kitchen. levofloxacin (LEVAQUIN) 500 MG tablet   Oral   Take 1 tablet (500 mg total) by mouth daily. Patient not taking: Reported on 07/27/2015   4 tablet   0     Allergies Review of patient's allergies indicates no known allergies.  Family History  Problem Relation Age of Onset  . Diabetes Other     Social History Social History  Substance Use Topics  . Smoking status: Never Smoker   . Smokeless tobacco: Never Used  . Alcohol Use: No    Review of Systems Constitutional: No fever/chills Eyes: No visual changes. ENT: No sore throat. Cardiovascular: Denies chest pain. Respiratory: +shortness of breath. Gastrointestinal: No abdominal pain.  No nausea, no vomiting.  No diarrhea.  No constipation. Genitourinary: Negative for dysuria. Musculoskeletal: Negative for back pain. Skin: Negative for rash. Neurological: Negative for headaches, focal weakness or numbness.  10-point ROS otherwise negative.  ____________________________________________   PHYSICAL EXAM:  VITAL SIGNS: ED Triage Vitals  Enc Vitals Group     BP 07/27/15 2217 158/97 mmHg     Pulse  Rate 07/27/15 2217 89     Resp 07/27/15 2217 24     Temp 07/27/15 2217 98.3 F (36.8 C)     Temp Source 07/27/15 2217  Oral     SpO2 07/27/15 2217 94 %     Weight 07/27/15 2217 243 lb (110.224 kg)     Height 07/27/15 2217 6' (1.829 m)     Head Cir --      Peak Flow --      Pain Score 07/27/15 2235 0     Pain Loc --      Pain Edu? --      Excl. in GC? --     Constitutional: Alert and oriented. Nontoxic-appearing, in mild respiratory distress with increased work of breathing. Eyes: Conjunctivae are normal. PERRL. EOMI. Head: Atraumatic. Nose: No congestion/rhinnorhea. Mouth/Throat: Mucous membranes are moist.  Oropharynx non-erythematous. Neck: No stridor.  Supple without meningismus. Cardiovascular: Normal rate, irregular rhythm. Grossly normal heart sounds.  Good peripheral circulation. Respiratory: Tachypnea with increased work of breathing. Diffuse expiratory wheeze, prolonged expiratory phase. Gastrointestinal: Mild distended with tenderness to palpation in the left lower quadrant. No CVA tenderness. Genitourinary: deferred Musculoskeletal: No lower extremity tenderness nor edema.  No joint effusions. Neurologic:  Normal speech and language. No gross focal neurologic deficits are appreciated. No gait instability.Skin:  Skin is warm, dry and intact. No rash noted. Psychiatric: Mood and affect are normal. Speech and behavior are normal.  ____________________________________________   LABS (all labs ordered are listed, but only abnormal results are displayed)  Labs Reviewed  BASIC METABOLIC PANEL - Abnormal; Notable for the following:    Potassium 3.2 (*)    Glucose, Bld 333 (*)    BUN 27 (*)    Creatinine, Ser 1.44 (*)    Calcium 8.4 (*)    GFR calc non Af Amer 41 (*)    GFR calc Af Amer 47 (*)    All other components within normal limits  CBC - Abnormal; Notable for the following:    RBC 4.09 (*)    Hemoglobin 12.1 (*)    HCT 36.9 (*)    All other components within  normal limits  CULTURE, BLOOD (ROUTINE X 2)  CULTURE, BLOOD (ROUTINE X 2)  TROPONIN I   ____________________________________________  EKG  ED ECG REPORT I, Gayla Doss, the attending physician, personally viewed and interpreted this ECG.   Date: 07/27/2015  EKG Time: 22:19  Rate: 99  Rhythm: atrial fibrillation, rate 99  Axis: normal  Intervals:nonspecific intraventricular conduction delay  ST&T Change: No acute ST elevation. PVCs.  ____________________________________________  RADIOLOGY  CXR IMPRESSION: Bibasilar linear and ground-glass densities may represent atelectasis or pneumonia. No focal consolidation  CT abdomen and pelvis IMPRESSION: Small bilateral pleural effusions and lung base opacities concerning for pneumonia.  Small hypodense lesion in the distal body/ tail of the pancreas with interval growth since 2010. MRI may provide better characterisation.  Colonic diverticulosis.  Constipation. No bowel obstruction or inflammation. Normal appendix.  Trabeculated bladder wall indicative of chronic bladder outlet obstruction.  ____________________________________________   PROCEDURES  Procedure(s) performed: None  Critical Care performed: No  ____________________________________________   INITIAL IMPRESSION / ASSESSMENT AND PLAN / ED COURSE  Pertinent labs & imaging results that were available during my care of the patient were reviewed by me and considered in my medical decision making (see chart for details).  Martin [name] is a 82 y.o. male with hypertension, diabetes, chronic systolic CHF, CAD, afib who presents for evaluation of shortness of breath this evening, worse with exertion. Exam, he is tachypneic with mild increased work of breathing, diffuse expiratory phase and poor air movement concerning for possible reactive airway  disease. He also has tenderness throughout the left abdomen. We'll give additional DuoNeb treatments, give  Solu-Medrol, obtain CT of the abdomen and pelvis as well as screening labs. Anticipate admission.  ----------------------------------------- 1:20 AM on 07/28/2015 ----------------------------------------- CXR with PNA in bilateral bases. CTX and azithromycin ordered. CT scan with no acute intraabdominal pathology with exception of constipation. Case discussed with Dr. Anne Hahn for admission.  ____________________________________________   FINAL CLINICAL IMPRESSION(S) / ED DIAGNOSES  Final diagnoses:  SOB (shortness of breath)  Community acquired pneumonia  Constipation, unspecified constipation type      Gayla Doss, MD 07/28/15 [phone]

## 2015-07-27 NOTE — ED Notes (Signed)
Family at bedside at this time. Family updated on plan.

## 2015-07-27 NOTE — ED Notes (Addendum)
Per [facility]: 82 yo oaks, sob, breathing tx, expiratory wheezes, given 2 duonebs.  spo2 100 156/84, 76 irreg with hx of afib.  no chest pain.  abdomen is distended with recent narrow bms. Patient states his abdomen is abnormally distended and it is interfering with respiration.

## 2015-07-28 ENCOUNTER — Emergency Department: Payer: Medicare Other

## 2015-07-28 DIAGNOSIS — R0602 Shortness of breath: Secondary | ICD-10-CM | POA: Diagnosis present

## 2015-07-28 DIAGNOSIS — J45909 Unspecified asthma, uncomplicated: Secondary | ICD-10-CM | POA: Diagnosis present

## 2015-07-28 DIAGNOSIS — Y95 Nosocomial condition: Secondary | ICD-10-CM | POA: Diagnosis present

## 2015-07-28 DIAGNOSIS — J9601 Acute respiratory failure with hypoxia: Secondary | ICD-10-CM | POA: Diagnosis present

## 2015-07-28 DIAGNOSIS — J189 Pneumonia, unspecified organism: Secondary | ICD-10-CM | POA: Diagnosis present

## 2015-07-28 DIAGNOSIS — N400 Enlarged prostate without lower urinary tract symptoms: Secondary | ICD-10-CM | POA: Diagnosis present

## 2015-07-28 DIAGNOSIS — I482 Chronic atrial fibrillation: Secondary | ICD-10-CM | POA: Diagnosis present

## 2015-07-28 DIAGNOSIS — K5900 Constipation, unspecified: Secondary | ICD-10-CM | POA: Diagnosis present

## 2015-07-28 DIAGNOSIS — Z7901 Long term (current) use of anticoagulants: Secondary | ICD-10-CM | POA: Diagnosis not present

## 2015-07-28 DIAGNOSIS — J440 Chronic obstructive pulmonary disease with acute lower respiratory infection: Secondary | ICD-10-CM | POA: Diagnosis present

## 2015-07-28 DIAGNOSIS — I2510 Atherosclerotic heart disease of native coronary artery without angina pectoris: Secondary | ICD-10-CM | POA: Diagnosis present

## 2015-07-28 DIAGNOSIS — I739 Peripheral vascular disease, unspecified: Secondary | ICD-10-CM | POA: Diagnosis present

## 2015-07-28 DIAGNOSIS — Z833 Family history of diabetes mellitus: Secondary | ICD-10-CM | POA: Diagnosis not present

## 2015-07-28 DIAGNOSIS — Z79899 Other long term (current) drug therapy: Secondary | ICD-10-CM | POA: Diagnosis not present

## 2015-07-28 DIAGNOSIS — E669 Obesity, unspecified: Secondary | ICD-10-CM | POA: Diagnosis present

## 2015-07-28 DIAGNOSIS — Z7982 Long term (current) use of aspirin: Secondary | ICD-10-CM | POA: Diagnosis not present

## 2015-07-28 DIAGNOSIS — N183 Chronic kidney disease, stage 3 (moderate): Secondary | ICD-10-CM | POA: Diagnosis present

## 2015-07-28 DIAGNOSIS — E1122 Type 2 diabetes mellitus with diabetic chronic kidney disease: Secondary | ICD-10-CM | POA: Diagnosis present

## 2015-07-28 DIAGNOSIS — T380X5A Adverse effect of glucocorticoids and synthetic analogues, initial encounter: Secondary | ICD-10-CM | POA: Diagnosis not present

## 2015-07-28 DIAGNOSIS — J441 Chronic obstructive pulmonary disease with (acute) exacerbation: Secondary | ICD-10-CM | POA: Diagnosis present

## 2015-07-28 DIAGNOSIS — Z951 Presence of aortocoronary bypass graft: Secondary | ICD-10-CM | POA: Diagnosis not present

## 2015-07-28 DIAGNOSIS — E785 Hyperlipidemia, unspecified: Secondary | ICD-10-CM | POA: Diagnosis present

## 2015-07-28 DIAGNOSIS — I5022 Chronic systolic (congestive) heart failure: Secondary | ICD-10-CM | POA: Diagnosis present

## 2015-07-28 DIAGNOSIS — I130 Hypertensive heart and chronic kidney disease with heart failure and stage 1 through stage 4 chronic kidney disease, or unspecified chronic kidney disease: Secondary | ICD-10-CM | POA: Diagnosis present

## 2015-07-28 DIAGNOSIS — Z794 Long term (current) use of insulin: Secondary | ICD-10-CM | POA: Diagnosis not present

## 2015-07-28 DIAGNOSIS — Z6834 Body mass index (BMI) 34.0-34.9, adult: Secondary | ICD-10-CM | POA: Diagnosis not present

## 2015-07-28 LAB — MRSA PCR SCREENING: MRSA BY PCR: NEGATIVE

## 2015-07-28 LAB — GLUCOSE, CAPILLARY
GLUCOSE-CAPILLARY: 481 mg/dL — AB (ref 65–99)
Glucose-Capillary: 378 mg/dL — ABNORMAL HIGH (ref 65–99)
Glucose-Capillary: 487 mg/dL — ABNORMAL HIGH (ref 65–99)
Glucose-Capillary: 312 mg/dL — ABNORMAL HIGH (ref 65–99)
Glucose-Capillary: 325 mg/dL — ABNORMAL HIGH (ref 65–99)

## 2015-07-28 LAB — INFLUENZA PANEL BY PCR (TYPE A & B)
H1N1FLUPCR: NOT DETECTED
Influenza A By PCR: NEGATIVE
Influenza B By PCR: NEGATIVE

## 2015-07-28 LAB — BASIC METABOLIC PANEL
Anion gap: 10 (ref 5–15)
BUN: 27 mg/dL — AB (ref 6–20)
CALCIUM: 8.3 mg/dL — AB (ref 8.9–10.3)
CO2: 23 mmol/L (ref 22–32)
CREATININE: 1.36 mg/dL — AB (ref 0.61–1.24)
Chloride: 102 mmol/L (ref 101–111)
GFR, EST AFRICAN AMERICAN: 51 mL/min — AB (ref >60)
GFR, EST NON AFRICAN AMERICAN: 44 mL/min — AB (ref >60)
Glucose, Bld: 410 mg/dL — ABNORMAL HIGH (ref 65–99)
Potassium: 3.4 mmol/L — ABNORMAL LOW (ref 3.5–5.1)
SODIUM: 135 mmol/L (ref 135–145)

## 2015-07-28 LAB — CBC
HCT: 35.2 % — ABNORMAL LOW (ref 40.0–52.0)
Hemoglobin: 11.6 g/dL — ABNORMAL LOW (ref 13.0–18.0)
MCH: 29.8 pg (ref 26.0–34.0)
MCHC: 32.9 g/dL (ref 32.0–36.0)
MCV: 90.8 fL (ref 80.0–100.0)
PLATELETS: 194 10*3/uL (ref 150–440)
RBC: 3.88 MIL/uL — ABNORMAL LOW (ref 4.40–5.90)
RDW: 14.2 % (ref 11.5–14.5)
WBC: 11.6 10*3/uL — AB (ref 3.8–10.6)

## 2015-07-28 LAB — HEMOGLOBIN A1C: HEMOGLOBIN A1C: 8.6 % — AB (ref 4.0–6.0)

## 2015-07-28 MED ORDER — GABAPENTIN 300 MG PO CAPS
600.0000 mg | ORAL_CAPSULE | Freq: Every day | ORAL | Status: DC
  Administered 2015-07-28 – 2015-07-30 (×3): 600 mg via ORAL
  Filled 2015-07-28: qty 2

## 2015-07-28 MED ORDER — METHYLPREDNISOLONE SODIUM SUCC 125 MG IJ SOLR
60.0000 mg | Freq: Three times a day (TID) | INTRAMUSCULAR | Status: DC
  Administered 2015-07-28 – 2015-07-29 (×4): 60 mg via INTRAVENOUS
  Filled 2015-07-28 (×4): qty 2

## 2015-07-28 MED ORDER — PIPERACILLIN-TAZOBACTAM 4.5 G IVPB
4.5000 g | Freq: Three times a day (TID) | INTRAVENOUS | Status: DC
  Administered 2015-07-28: 4.5 g via INTRAVENOUS
  Filled 2015-07-28 (×2): qty 100

## 2015-07-28 MED ORDER — PIPERACILLIN-TAZOBACTAM 3.375 G IVPB
3.3750 g | Freq: Three times a day (TID) | INTRAVENOUS | Status: DC
  Filled 2015-07-28 (×2): qty 50

## 2015-07-28 MED ORDER — BISACODYL 5 MG PO TBEC
10.0000 mg | DELAYED_RELEASE_TABLET | Freq: Every day | ORAL | Status: AC
  Administered 2015-07-28 – 2015-07-29 (×2): 10 mg via ORAL
  Filled 2015-07-28 (×2): qty 2

## 2015-07-28 MED ORDER — DEXTROSE 5 % IV SOLN
1.0000 g | INTRAVENOUS | Status: DC
  Administered 2015-07-28 – 2015-07-29 (×2): 1 g via INTRAVENOUS
  Filled 2015-07-28 (×3): qty 10

## 2015-07-28 MED ORDER — ASPIRIN 325 MG PO TABS
325.0000 mg | ORAL_TABLET | Freq: Every day | ORAL | Status: DC
  Administered 2015-07-28 – 2015-07-31 (×4): 325 mg via ORAL
  Filled 2015-07-28 (×4): qty 1

## 2015-07-28 MED ORDER — IPRATROPIUM-ALBUTEROL 0.5-2.5 (3) MG/3ML IN SOLN
3.0000 mL | RESPIRATORY_TRACT | Status: DC | PRN
  Administered 2015-07-28 – 2015-07-29 (×3): 3 mL via RESPIRATORY_TRACT
  Filled 2015-07-28 (×4): qty 3

## 2015-07-28 MED ORDER — INSULIN ASPART 100 UNIT/ML ~~LOC~~ SOLN
0.0000 [IU] | Freq: Three times a day (TID) | SUBCUTANEOUS | Status: DC
  Administered 2015-07-28: 9 [IU] via SUBCUTANEOUS
  Administered 2015-07-28: 17:00:00 7 [IU] via SUBCUTANEOUS
  Administered 2015-07-29: 5 [IU] via SUBCUTANEOUS
  Filled 2015-07-28: qty 7
  Filled 2015-07-28: qty 9
  Filled 2015-07-28: qty 5

## 2015-07-28 MED ORDER — ACETAMINOPHEN 650 MG RE SUPP: 650.0000 mg | Freq: Four times a day (QID) | RECTAL | Status: DC | PRN

## 2015-07-28 MED ORDER — ONDANSETRON HCL 4 MG PO TABS: 4.0000 mg | ORAL_TABLET | Freq: Four times a day (QID) | ORAL | Status: DC | PRN

## 2015-07-28 MED ORDER — AZITHROMYCIN 250 MG PO TABS
500.0000 mg | ORAL_TABLET | ORAL | Status: DC
  Administered 2015-07-28 – 2015-07-29 (×2): 500 mg via ORAL
  Filled 2015-07-28 (×2): qty 2

## 2015-07-28 MED ORDER — GABAPENTIN 300 MG PO CAPS
300.0000 mg | ORAL_CAPSULE | Freq: Two times a day (BID) | ORAL | Status: DC
  Administered 2015-07-28 – 2015-07-31 (×8): 300 mg via ORAL
  Filled 2015-07-28 (×11): qty 1

## 2015-07-28 MED ORDER — INSULIN ASPART 100 UNIT/ML ~~LOC~~ SOLN
15.0000 [IU] | Freq: Once | SUBCUTANEOUS | Status: AC
  Administered 2015-07-28: 15 [IU] via SUBCUTANEOUS
  Filled 2015-07-28: qty 15

## 2015-07-28 MED ORDER — GUAIFENESIN-DM 100-10 MG/5ML PO SYRP: 15.0000 mL | ORAL_SOLUTION | ORAL | Status: DC | PRN

## 2015-07-28 MED ORDER — TERAZOSIN HCL 2 MG PO CAPS
2.0000 mg | ORAL_CAPSULE | Freq: Every day | ORAL | Status: DC
  Administered 2015-07-28 – 2015-07-30 (×3): 2 mg via ORAL
  Filled 2015-07-28 (×4): qty 1

## 2015-07-28 MED ORDER — PRAVASTATIN SODIUM 20 MG PO TABS
20.0000 mg | ORAL_TABLET | Freq: Every day | ORAL | Status: DC
  Administered 2015-07-28 – 2015-07-30 (×3): 20 mg via ORAL
  Filled 2015-07-28 (×3): qty 1

## 2015-07-28 MED ORDER — RIVAROXABAN 20 MG PO TABS
20.0000 mg | ORAL_TABLET | Freq: Every day | ORAL | Status: DC
  Administered 2015-07-28: 20 mg via ORAL
  Filled 2015-07-28: qty 1

## 2015-07-28 MED ORDER — FLEET ENEMA 7-19 GM/118ML RE ENEM
1.0000 | ENEMA | Freq: Once | RECTAL | Status: AC | PRN
  Administered 2015-07-28: 1 via RECTAL

## 2015-07-28 MED ORDER — INSULIN ASPART 100 UNIT/ML ~~LOC~~ SOLN
3.0000 [IU] | Freq: Three times a day (TID) | SUBCUTANEOUS | Status: DC
  Administered 2015-07-28 – 2015-07-31 (×10): 3 [IU] via SUBCUTANEOUS
  Filled 2015-07-28 (×8): qty 3

## 2015-07-28 MED ORDER — IPRATROPIUM-ALBUTEROL 0.5-2.5 (3) MG/3ML IN SOLN
3.0000 mL | Freq: Once | RESPIRATORY_TRACT | Status: AC
  Administered 2015-07-28: 3 mL via RESPIRATORY_TRACT
  Filled 2015-07-28: qty 3

## 2015-07-28 MED ORDER — RIVAROXABAN 15 MG PO TABS
15.0000 mg | ORAL_TABLET | Freq: Every day | ORAL | Status: DC
  Administered 2015-07-29 – 2015-07-31 (×3): 15 mg via ORAL
  Filled 2015-07-28 (×3): qty 1

## 2015-07-28 MED ORDER — IOPAMIDOL (ISOVUE-300) INJECTION 61%
80.0000 mL | Freq: Once | INTRAVENOUS | Status: AC | PRN
  Administered 2015-07-28: 80 mL via INTRAVENOUS

## 2015-07-28 MED ORDER — SENNOSIDES-DOCUSATE SODIUM 8.6-50 MG PO TABS
1.0000 | ORAL_TABLET | Freq: Two times a day (BID) | ORAL | Status: DC
  Administered 2015-07-28 – 2015-07-31 (×7): 1 via ORAL
  Filled 2015-07-28 (×7): qty 1

## 2015-07-28 MED ORDER — INSULIN GLARGINE 100 UNIT/ML ~~LOC~~ SOLN
20.0000 [IU] | Freq: Every day | SUBCUTANEOUS | Status: DC
  Administered 2015-07-28: 12:00:00 20 [IU] via SUBCUTANEOUS
  Filled 2015-07-28 (×2): qty 0.2

## 2015-07-28 MED ORDER — INSULIN ASPART 100 UNIT/ML ~~LOC~~ SOLN
0.0000 [IU] | Freq: Every day | SUBCUTANEOUS | Status: DC
Start: 2015-07-28 — End: 2015-07-31
  Administered 2015-07-28: 4 [IU] via SUBCUTANEOUS
  Administered 2015-07-30: 22:00:00 2 [IU] via SUBCUTANEOUS
  Filled 2015-07-28: qty 5
  Filled 2015-07-28: qty 2

## 2015-07-28 MED ORDER — ONDANSETRON HCL 4 MG/2ML IJ SOLN: 4.0000 mg | Freq: Four times a day (QID) | INTRAMUSCULAR | Status: DC | PRN

## 2015-07-28 MED ORDER — GABAPENTIN 300 MG PO CAPS: 300.0000 mg | ORAL_CAPSULE | Freq: Three times a day (TID) | ORAL | Status: DC

## 2015-07-28 MED ORDER — FUROSEMIDE 40 MG PO TABS
40.0000 mg | ORAL_TABLET | Freq: Every day | ORAL | Status: DC
  Administered 2015-07-28: 08:00:00 40 mg via ORAL
  Filled 2015-07-28 (×2): qty 1

## 2015-07-28 MED ORDER — INSULIN REGULAR HUMAN 100 UNIT/ML IJ SOLN: 3.0000 [IU] | Freq: Three times a day (TID) | INTRAMUSCULAR | Status: DC

## 2015-07-28 MED ORDER — SODIUM CHLORIDE 0.9% FLUSH
3.0000 mL | Freq: Two times a day (BID) | INTRAVENOUS | Status: DC
  Administered 2015-07-28 – 2015-07-30 (×6): 3 mL via INTRAVENOUS

## 2015-07-28 MED ORDER — ACETAMINOPHEN 325 MG PO TABS: 650.0000 mg | ORAL_TABLET | Freq: Four times a day (QID) | ORAL | Status: DC | PRN

## 2015-07-28 NOTE — Progress Notes (Signed)
Pharmacy Antibiotic Note  Martin [name] is a 82 y.o. male admitted on 07/27/2015 with pneumonia.  Pharmacy has been consulted for piperacillin/tazobactam dosing.  Plan: Will change dose to piperacillin/tazobactam 3.375 g IV q8h  Height: 6' (182.9 cm) Weight: 250 lb 8 oz (113.626 kg) IBW/kg (Calculated) : 77.6  Temp (24hrs), Avg:98 F (36.7 C), Min:97.6 F (36.4 C), Max:98.3 F (36.8 C)   Recent Labs Lab 07/27/15 2228 07/28/15 0447  WBC 10.0 11.6*  CREATININE 1.44* 1.36*    Estimated Creatinine Clearance: 46 mL/min (by C-G formula based on Cr of 1.36).    No Known Allergies  Antimicrobials this admission: Azithromycin & CTX one time doses in ED 3/24 Piperacillin/tazobactam 3/24 >>   Dose adjustments this admission: 3/24 Changed piperacillin/tazobactam dose from 4.5 g to 3.375 g q8h  Microbiology results: 3/24 BCx: Sent 3/24 MRSA PCR: Sent  Thank you for allowing pharmacy to be a part of this patient's care.  Cindi CarbonMary M Crystalyn Delia, PharmD Clinical Pharmacist 07/28/2015 7:48 AM

## 2015-07-28 NOTE — H&P (Signed)
[facility] - [place] at [facility]   PATIENT NAME: Martin [name]    MR#:  [id number]  DATE OF BIRTH:  Jul 03, 1921  DATE OF ADMISSION:  07/27/2015  PRIMARY CARE PHYSICIAN: Pcp Not In System   REQUESTING/REFERRING PHYSICIAN: Inocencio Homes, MD  CHIEF COMPLAINT:   Chief Complaint  Patient presents with  . Shortness of Breath    HISTORY OF PRESENT ILLNESS:  Martin [name]  is a 82 y.o. male who presents with shortness of breath. Patient states that he was walking and became significantly short of breath. He is resident of a nursing facility and was admitted here to the hospital for pneumonia about one month ago. Evaluation in the ED shows likely new basilar pneumonia. Hospitalists were called for admission.  PAST MEDICAL HISTORY:   Past Medical History  Diagnosis Date  . Coronary artery disease   . CHF (congestive heart failure) (HCC)     ischemic cardiomyopathy- EF 25%  . Hyperlipidemia   . Diabetes mellitus without complication (HCC)   . Atrial fibrillation (HCC)   . BPH (benign prostatic hyperplasia)   . Peripheral vascular disease (HCC)   . Chronic kidney disease   . Insomnia   . Renal insufficiency   . Asthma     PAST SURGICAL HISTORY:   Past Surgical History  Procedure Laterality Date  . Cardiac surgery    . Coronary artery bypass graft      SOCIAL HISTORY:   Social History  Substance Use Topics  . Smoking status: Never Smoker   . Smokeless tobacco: Never Used  . Alcohol Use: No    FAMILY HISTORY:   Family History  Problem Relation Age of Onset  . Diabetes Other     DRUG ALLERGIES:  No Known Allergies  MEDICATIONS AT HOME:   Prior to Admission medications   Medication Sig Start Date End Date Taking? Authorizing Provider  albuterol (PROVENTIL HFA;VENTOLIN HFA) 108 (90 BASE) MCG/ACT inhaler Inhale 2 puffs into the lungs every 6 (six) hours as needed for wheezing or shortness of breath. 03/18/15  Yes Ramonita Lab, MD  aspirin 325 MG  tablet Take 325 mg by mouth daily.    Yes Historical Provider, MD  dextromethorphan-guaiFENesin (MUCINEX DM) 30-600 MG 12hr tablet Take 1 tablet by mouth every 12 (twelve) hours.   Yes Historical Provider, MD  dextrose (GLUTOSE) 40 % GEL Take 1 Tube by mouth as needed for low blood sugar.   Yes Historical Provider, MD  docusate sodium (COLACE) 100 MG capsule Take 100 mg by mouth 2 (two) times daily.   Yes Historical Provider, MD  furosemide (LASIX) 40 MG tablet Take 1 tablet (40 mg total) by mouth daily. Patient taking differently: Take 40 mg by mouth daily. Take 1 tablet daily Monday-Friday and 1 tablet twice daily on Saturday and Sunday. 03/18/15  Yes Ramonita Lab, MD  gabapentin (NEURONTIN) 300 MG capsule Take 300-600 mg by mouth 3 (three) times daily. Take 1 capsule at 0900 & 1400 and 2 capsules at 2000.   Yes Historical Provider, MD  guaiFENesin-dextromethorphan (ROBITUSSIN DM) 100-10 MG/5ML syrup Take 15 mLs by mouth every 4 (four) hours as needed for cough.   Yes Historical Provider, MD  insulin aspart (NOVOLOG) 100 UNIT/ML injection Inject 3 Units into the skin 3 (three) times daily with meals. 03/18/15  Yes Ramonita Lab, MD  insulin glargine (LANTUS) 100 UNIT/ML injection Inject 0.32 mLs (32 Units total) into the skin daily. Patient taking differently: Inject 34 Units into the skin  daily.  03/18/15  Yes Ramonita LabAruna Gouru, MD  ipratropium-albuterol (DUONEB) 0.5-2.5 (3) MG/3ML SOLN Take 3 mLs by nebulization every 6 (six) hours as needed. 03/18/15  Yes Ramonita LabAruna Gouru, MD  nitroGLYCERIN (NITROSTAT) 0.4 MG SL tablet Place 1 tablet (0.4 mg total) under the tongue every 5 (five) minutes as needed for chest pain. 09/12/14  Yes Houston SirenVivek J Sainani, MD  oxyCODONE-acetaminophen (PERCOCET/ROXICET) 5-325 MG tablet Take 0.5 tablets by mouth at bedtime. Pt also takes one-half tablet every six hours as needed for moderate/severe pain. 03/18/15  Yes Ramonita LabAruna Gouru, MD  polyethylene glycol (MIRALAX / GLYCOLAX) packet Take 17 g by  mouth every Monday, Wednesday, and Friday.    Yes Historical Provider, MD  pravastatin (PRAVACHOL) 20 MG tablet Take 20 mg by mouth at bedtime.    Yes Historical Provider, MD  rivaroxaban (XARELTO) 20 MG TABS tablet Take 20 mg by mouth daily.   Yes Historical Provider, MD  terazosin (HYTRIN) 2 MG capsule Take 2 mg by mouth at bedtime.    Yes Historical Provider, MD  triazolam (HALCION) 0.25 MG tablet Take 0.5 mg by mouth at bedtime.    Yes Historical Provider, MD  vitamin B-12 (CYANOCOBALAMIN) 500 MCG tablet Take 500 mcg by mouth daily.   Yes Historical Provider, MD  levofloxacin (LEVAQUIN) 500 MG tablet Take 1 tablet (500 mg total) by mouth daily. Patient not taking: Reported on 07/27/2015 06/23/15   Auburn BilberryShreyang Patel, MD    REVIEW OF SYSTEMS:  Review of Systems  Constitutional: Negative for fever, chills, weight loss and malaise/fatigue.  HENT: Negative for ear pain, hearing loss and tinnitus.   Eyes: Negative for blurred vision, double vision, pain and redness.  Respiratory: Positive for shortness of breath. Negative for cough and hemoptysis.   Cardiovascular: Negative for chest pain, palpitations, orthopnea and leg swelling.  Gastrointestinal: Negative for nausea, vomiting, abdominal pain, diarrhea and constipation.  Genitourinary: Negative for dysuria, frequency and hematuria.  Musculoskeletal: Negative for back pain, joint pain and neck pain.  Skin:       No acne, rash, or lesions  Neurological: Negative for dizziness, tremors, focal weakness and weakness.  Endo/Heme/Allergies: Negative for polydipsia. Does not bruise/bleed easily.  Psychiatric/Behavioral: Negative for depression. The patient is not nervous/anxious and does not have insomnia.      VITAL SIGNS:   Filed Vitals:   07/27/15 2217 07/27/15 2300 07/27/15 2330  BP: 158/97 107/72 117/76  Pulse: 89 109 99  Temp: 98.3 F (36.8 C)    TempSrc: Oral    Resp: 24 19 13   Height: 6' (1.829 m)    Weight: 110.224 kg (243 lb)     SpO2: 94% 96% 93%   Wt Readings from Last 3 Encounters:  07/27/15 110.224 kg (243 lb)  06/23/15 108.682 kg (239 lb 9.6 oz)  04/21/15 108.863 kg (240 lb)    PHYSICAL EXAMINATION:  Physical Exam  Vitals reviewed. Constitutional: He is oriented to person, place, and time. He appears well-developed and well-nourished. No distress.  HENT:  Head: Normocephalic and atraumatic.  Mouth/Throat: Oropharynx is clear and moist.  Eyes: Conjunctivae and EOM are normal. Pupils are equal, round, and reactive to light. No scleral icterus.  Neck: Normal range of motion. Neck supple. No JVD present. No thyromegaly present.  Cardiovascular: Normal rate, regular rhythm and intact distal pulses.  Exam reveals no gallop and no friction rub.   No murmur heard. Respiratory: Effort normal. No respiratory distress. He has wheezes. He has no rales.  GI: Soft. Bowel sounds  are normal. He exhibits no distension. There is no tenderness.  Musculoskeletal: Normal range of motion. He exhibits no edema.  No arthritis, no gout  Lymphadenopathy:    He has no cervical adenopathy.  Neurological: He is alert and oriented to person, place, and time. No cranial nerve deficit.  No dysarthria, no aphasia  Skin: Skin is warm and dry. No rash noted. No erythema.  Psychiatric: He has a normal mood and affect. His behavior is normal. Judgment and thought content normal.    LABORATORY PANEL:   CBC  Recent Labs Lab 07/27/15 2228  WBC 10.0  HGB 12.1*  HCT 36.9*  PLT 211   ------------------------------------------------------------------------------------------------------------------  Chemistries   Recent Labs Lab 07/27/15 2228  NA 137  K 3.2*  CL 103  CO2 26  GLUCOSE 333*  BUN 27*  CREATININE 1.44*  CALCIUM 8.4*   ------------------------------------------------------------------------------------------------------------------  Cardiac Enzymes  Recent Labs Lab 07/27/15 2228  TROPONINI 0.03    ------------------------------------------------------------------------------------------------------------------  RADIOLOGY:  Dg Chest 2 View  07/27/2015  CLINICAL DATA:  82 year old male with shortness of breath EXAM: CHEST  2 VIEW COMPARISON:  CT dated 06/20/2015 FINDINGS: Two views of the chest demonstrate mildly hyperinflated lungs. There bibasilar linear and ground-glass densities, likely atelectatic changes. Pneumonia is not excluded. There is no focal consolidation, pleural effusion, or pneumothorax. There is cardiomegaly. Median sternotomy wires and CABG vascular clips noted. No acute osseous pathology identified. IMPRESSION: Bibasilar linear and ground-glass densities may represent atelectasis or pneumonia. No focal consolidation Electronically Signed   By: Elgie Collard M.D.   On: 07/27/2015 23:26   Ct Abdomen Pelvis W Contrast  07/28/2015  CLINICAL DATA:  82 year old male with left-sided abdominal pain and swelling. EXAM: CT ABDOMEN AND PELVIS WITH CONTRAST TECHNIQUE: Multidetector CT imaging of the abdomen and pelvis was performed using the standard protocol following bolus administration of intravenous contrast. CONTRAST:  80mL ISOVUE-300 IOPAMIDOL (ISOVUE-300) INJECTION 61% COMPARISON:  Chest CT dated 06/20/2015 and abdominal CT 10/13/2008 FINDINGS: Partially visualized trace bilateral pleural effusions. Patchy areas of airspace density at the lung bases most compatible with pneumonia. There is coronary vascular calcification. No intra-abdominal free air or free fluid. The liver, gallbladder appear unremarkable. There is atrophy of pancreas with scant speckled calcifications, likely sequela of chronic pancreatitis. No evidence of active inflammatory changes. There is a 2 cm round hypodense lesion in the distal body/tail of the pancreas which is increased in size compared to the CT dated 10/13/2008. MRI may provide better characterisation. The spleen, adrenal glands appear unremarkable.  There is moderate bilateral renal parenchyma and cortical atrophy. There is a 1 cm exophytic hypodense lesion in the upper pole of the right kidney, possibly a cyst. There is no hydronephrosis or nephrolithiasis on either side. The visualized ureters appear unremarkable. There is trabecular appearance of the bladder wall compatible with chronic bladder outlet obstruction. There is heterogeneous appearance of the prostate gland. There is colonic diverticulosis without active inflammatory changes. Constipation. No evidence of bowel obstruction. Normal appendix. There is aortoiliac atherosclerotic disease. No portal venous gas identified. There is no adenopathy. Small fat containing umbilical hernia as well as small fat containing left inguinal hernia. There is osteopenia with degenerative changes of the spine and right hip. No acute fracture. IMPRESSION: Small bilateral pleural effusions and lung base opacities concerning for pneumonia. Small hypodense lesion in the distal body/ tail of the pancreas with interval growth since 2010. MRI may provide better characterisation. Colonic diverticulosis. Constipation. No bowel obstruction or inflammation. Normal appendix.  Trabeculated bladder wall indicative of chronic bladder outlet obstruction. Electronically Signed   By: Elgie Collard M.D.   On: 07/28/2015 00:55    EKG:   Orders placed or performed during the hospital encounter of 07/27/15  . EKG 12-Lead  . EKG 12-Lead  . ED EKG  . ED EKG    IMPRESSION AND PLAN:  Principal Problem:   HCAP (healthcare-associated pneumonia) - IV antibiotics started in the ED. Blood cultures sent, sputum culture ordered. Patient is hemodynamically stable. Continue antibiotics and follow up on cultures. Active Problems:   Diabetes mellitus type 2 in obese (HCC) - sliding scale insulin with corresponding glucose checks before meals at bedtime with heart healthy/carb modified diet   Chronic atrial fibrillation (HCC) -  currently rate controlled, continue rate controlling medications from home as well as anticoagulation.   Chronic kidney disease, stage III (moderate) - avoid nephrotoxins, kidney function is stable at baseline, monitor closely.   Chronic systolic (congestive) heart failure (HCC) - continue home meds   Coronary artery disease - continue home medications  All the records are reviewed and case discussed with ED provider. Management plans discussed with the patient and/or family.  DVT PROPHYLAXIS: Systemic anticoagulation  GI PROPHYLAXIS: None  ADMISSION STATUS: Inpatient  CODE STATUS: Full Code Status History    Date Active Date Inactive Code Status Order ID Comments User Context   06/20/2015 12:04 PM 06/23/2015  5:10 PM Full Code [id number]  Milagros Loll, MD ED   03/14/2015 10:19 PM 03/18/2015  5:46 PM Full Code [id number]  Wyatt Haste, MD ED   10/02/2014  3:05 AM 10/02/2014  5:53 PM Full Code [id number]  Gale Journey, MD Inpatient   09/10/2014 12:05 PM 09/12/2014  9:31 PM Full Code [id number]  Altamese Dilling, MD Inpatient      TOTAL TIME TAKING CARE OF THIS PATIENT: 40 minutes.    Martin [name] 07/28/2015, 1:28 AM  [facility]  Office  [phone]  CC: Primary care physician; Pcp Not In System

## 2015-07-28 NOTE — Progress Notes (Addendum)
Dr Elpidio AnisSudini made aware of diabetic coordinator recommendations, states that he will review chart

## 2015-07-28 NOTE — Patient Instructions (Signed)
Follow up with the Heart Failure Clinic  August 10, 2015 @ 11:00 AM  [address] [phone]

## 2015-07-28 NOTE — Progress Notes (Signed)
Pharmacy Antibiotic Note  Martin [name] is a 82 y.o. male admitted on 07/27/2015 with pneumonia.  Pharmacy has been consulted for Zosyn dosing.  Plan: Zosyn 4.5 grams q 8 hours ordered for Pseudomonas risk of antibiotics received during recent admission.  Height: 6' (182.9 cm) Weight: 243 lb (110.224 kg) IBW/kg (Calculated) : 77.6  Temp (24hrs), Avg:98 F (36.7 C), Min:97.6 F (36.4 C), Max:98.3 F (36.8 C)   Recent Labs Lab 07/27/15 2228  WBC 10.0  CREATININE 1.44*    Estimated Creatinine Clearance: 42.8 mL/min (by C-G formula based on Cr of 1.44).    No Known Allergies  Antimicrobials this admission: Zosyn  >>    >>   Dose adjustments this admission:   Microbiology results: 3/24 BCx: pending 3/24 Sputum: pending  3/24 MRSA PCR: pending  CXR: atelectasis vs. pneumonia  Thank you for allowing pharmacy to be a part of this patient's care.  Martin [name] S 07/28/2015 3:10 AM

## 2015-07-28 NOTE — Progress Notes (Signed)
Per diabetic coordinator ok to give 3units of novolog before meal along with one time order of 15units novolog

## 2015-07-28 NOTE — ED Notes (Signed)
Admitting MD at bedside. Pts family present.

## 2015-07-28 NOTE — Progress Notes (Signed)
Inpatient Diabetes Program Recommendations  AACE/ADA: New Consensus Statement on Inpatient Glycemic Control (2015)  Target Ranges:  Prepandial:   less than 140 mg/dL      Peak postprandial:   less than 180 mg/dL (1-2 hours)      Critically ill patients:  140 - 180 mg/dL   Review of Glycemic Control  Results for Martin [name], Martin [name] (MRN [id number]) as of 07/28/2015 10:28  Ref. Range 07/28/2015 07:37  Glucose-Capillary Latest Ref Range: 65-99 mg/dL 409378 (H)    Diabetes history: Type 2 Outpatient Diabetes medications: Lantus 34 units daily, Novolog 3 units tid Current orders for Inpatient glycemic control:  Novolog 0-9 units tid, Novolog 0-5 units qhs  Inpatient Diabetes Program Recommendations: Fasting 378 mg/dl this am.Please consider starting patient on Lantus 17 units qhs and adding Novolog 3 units tid with meals.  Staff- do not give insulin closer than 4 hours apart- patient has poor kidney function.  Susette RacerJulie Marguerite Jarboe, RN, BA, MHA, CDE Diabetes Coordinator Inpatient Diabetes Program  [phone] (Team Pager) [phone] [facility] Office) 07/28/2015 10:30 AM

## 2015-07-28 NOTE — Care Management (Signed)
Patient sleeping soundly when I rounded. He is from [facility]. CSW referral placed. He is on O2 now. RNCM will follow.

## 2015-07-28 NOTE — Plan of Care (Signed)
Problem: Respiratory: Goal: Respiratory status will improve Outcome: Not Progressing Expiratory wheezes bilaterally. Labored breathing. Oxygen saturations went down into the 70's on room air. MD notified. Order given for O2 at 2 liters to keep oxygen saturations > 90%.

## 2015-07-29 LAB — BASIC METABOLIC PANEL
Anion gap: 7 (ref 5–15)
BUN: 32 mg/dL — AB (ref 6–20)
CHLORIDE: 105 mmol/L (ref 101–111)
CO2: 26 mmol/L (ref 22–32)
Calcium: 8.6 mg/dL — ABNORMAL LOW (ref 8.9–10.3)
Creatinine, Ser: 1.38 mg/dL — ABNORMAL HIGH (ref 0.61–1.24)
GFR calc Af Amer: 50 mL/min — ABNORMAL LOW (ref >60)
GFR calc non Af Amer: 43 mL/min — ABNORMAL LOW (ref >60)
Glucose, Bld: 289 mg/dL — ABNORMAL HIGH (ref 65–99)
POTASSIUM: 3.6 mmol/L (ref 3.5–5.1)
SODIUM: 138 mmol/L (ref 135–145)

## 2015-07-29 LAB — GLUCOSE, CAPILLARY
GLUCOSE-CAPILLARY: 270 mg/dL — AB (ref 65–99)
GLUCOSE-CAPILLARY: 428 mg/dL — AB (ref 65–99)
GLUCOSE-CAPILLARY: 453 mg/dL — AB (ref 65–99)
GLUCOSE-CAPILLARY: 326 mg/dL — AB (ref 65–99)
Glucose-Capillary: 157 mg/dL — ABNORMAL HIGH (ref 65–99)

## 2015-07-29 LAB — MAGNESIUM: Magnesium: 2.2 mg/dL (ref 1.7–2.4)

## 2015-07-29 MED ORDER — FUROSEMIDE 40 MG PO TABS
40.0000 mg | ORAL_TABLET | Freq: Two times a day (BID) | ORAL | Status: DC
Start: 2015-07-29 — End: 2015-07-31
  Administered 2015-07-29 – 2015-07-31 (×5): 40 mg via ORAL
  Filled 2015-07-29 (×4): qty 1

## 2015-07-29 MED ORDER — INSULIN GLARGINE 100 UNIT/ML ~~LOC~~ SOLN
24.0000 [IU] | Freq: Every day | SUBCUTANEOUS | Status: DC
  Administered 2015-07-29 – 2015-07-31 (×3): 24 [IU] via SUBCUTANEOUS
  Filled 2015-07-29 (×4): qty 0.24

## 2015-07-29 MED ORDER — METHYLPREDNISOLONE SODIUM SUCC 125 MG IJ SOLR
60.0000 mg | INTRAMUSCULAR | Status: DC
  Administered 2015-07-30: 09:00:00 60 mg via INTRAVENOUS
  Filled 2015-07-29: qty 2

## 2015-07-29 MED ORDER — INSULIN ASPART 100 UNIT/ML ~~LOC~~ SOLN
0.0000 [IU] | Freq: Three times a day (TID) | SUBCUTANEOUS | Status: DC
  Administered 2015-07-29: 17:00:00 15 [IU] via SUBCUTANEOUS
  Administered 2015-07-30: 3 [IU] via SUBCUTANEOUS
  Administered 2015-07-30: 4 [IU] via SUBCUTANEOUS
  Administered 2015-07-30 – 2015-07-31 (×2): 11 [IU] via SUBCUTANEOUS
  Administered 2015-07-31: 08:00:00 4 [IU] via SUBCUTANEOUS
  Filled 2015-07-29: qty 15
  Filled 2015-07-29 (×2): qty 11
  Filled 2015-07-29: qty 3
  Filled 2015-07-29: qty 4
  Filled 2015-07-29: qty 7

## 2015-07-29 MED ORDER — ZOLPIDEM TARTRATE 5 MG PO TABS
5.0000 mg | ORAL_TABLET | Freq: Once | ORAL | Status: AC
  Administered 2015-07-29: 5 mg via ORAL
  Filled 2015-07-29: qty 1

## 2015-07-29 MED ORDER — INSULIN ASPART 100 UNIT/ML ~~LOC~~ SOLN
20.0000 [IU] | Freq: Once | SUBCUTANEOUS | Status: AC
  Administered 2015-07-29: 20 [IU] via SUBCUTANEOUS
  Filled 2015-07-29: qty 20

## 2015-07-29 NOTE — Clinical Social Work Note (Signed)
Clinical Social Work Assessment  Patient Details  Name: Martin [name] MRN: [id number] Date of Birth: 07-27-1921  Date of referral:  07/29/15               Reason for consult:  Other (Comment Required) (Admitted from a facility)                Permission sought to share information with:   (Daughter Azucena Freed) Permission granted to share information::  Yes, Verbal Permission Granted  Name::      Kenney Houseman [place])  Agency::     Relationship::   (Daughter Azucena Freed)  Contact Information:   [phone])  Housing/Transportation Living arrangements for the past 2 months:  [place] of Information:  Patient Patient Interpreter Needed:  None Criminal Activity/Legal Involvement Pertinent to Current Situation/Hospitalization:  No - Comment as needed Significant Relationships:  Adult Children Lives with:  Facility Resident Do you feel safe going back to the place where you live?  Yes Need for family participation in patient care:  Yes (Comment)  Care giving concerns: Reason for consult: Admitted from a facility                 Social Worker assessment / plan: :  Clinical Education officer, museum (CSW) met with patient at bedside. Patient was alert and oriented, sitting up in chair. CSW introduced self and explained role of CSW department. Patient reported that he lives at [facility]. Patient states he has been a resident at [facility] for a little over a year. According to patient, he likes it at [facility] and would like to return upon discharge. Patient is currently on 2L of Oxygen. Emergency contact is daughter Azucena Freed. Patient has request for daughter to be notified once he is ready for d/c. Patient son-in-law Hazle Quant will transport patient back to ALF upon discharge. Patient has some difficulty with hearing, has hearing aids. CSW will continue to follow and assist as needed.   Employment status:  Retired Forensic scientist:  Managed  Care PT Recommendations:  Not assessed at this time Information / Referral to community resources:  Other (Comment Required) ([place])  Patient/Family's Response to care: Patient and family has agreed to return to [facility] once discharged.   Patient/Family's Understanding of and Emotional Response to Diagnosis, Current Treatment, and Prognosis:  Patient was pleasant throughout assessment and thanked CSW for visit.  Emotional Assessment Appearance:  Appears stated age Attitude/Demeanor/Rapport:    Affect (typically observed):  Appropriate, Calm, Pleasant Orientation:  Oriented to Self, Oriented to Place, Oriented to  Time, Oriented to Situation Alcohol / Substance use:    Psych involvement (Current and /or in the community):  No (Comment)  Discharge Needs  Concerns to be addressed:    Readmission within the last 30 days:    Current discharge risk:    Barriers to Discharge:  Continued Medical Work up   [facility]	[place], LCSW 07/29/2015, 10:26 AM

## 2015-07-29 NOTE — Progress Notes (Signed)
[facility] - [place] at [facility]   PATIENT NAME: Martin [name]    MR#:  [id number]  DATE OF BIRTH:  1922/01/22  SUBJECTIVE:  CHIEF COMPLAINT:   Chief Complaint  Patient presents with  . Shortness of Breath   Decreased hearing Shortness of breath is better. Has dry cough. Sitting up in a chair. Good Appetite. Blood sugars uncontrolled.  REVIEW OF SYSTEMS:    Review of Systems  Constitutional: Positive for malaise/fatigue. Negative for fever and chills.  HENT: Negative for sore throat.   Eyes: Negative for blurred vision, double vision and pain.  Respiratory: Positive for cough, shortness of breath and wheezing. Negative for hemoptysis.   Cardiovascular: Negative for chest pain, palpitations, orthopnea and leg swelling.  Gastrointestinal: Negative for heartburn, nausea, vomiting, abdominal pain, diarrhea and constipation.  Genitourinary: Negative for dysuria and hematuria.  Musculoskeletal: Negative for back pain and joint pain.  Skin: Negative for rash.  Neurological: Positive for weakness. Negative for sensory change, speech change, focal weakness and headaches.  Endo/Heme/Allergies: Does not bruise/bleed easily.  Psychiatric/Behavioral: Negative for depression. The patient is not nervous/anxious.     DRUG ALLERGIES:  No Known Allergies  VITALS:  Blood pressure 135/77, pulse 83, temperature 97.8 F (36.6 C), temperature source Oral, resp. rate 16, height 6' (1.829 m), weight 113.626 kg (250 lb 8 oz), SpO2 95 %.  PHYSICAL EXAMINATION:   Physical Exam  GENERAL:  82 y.o.-year-old patient lying in the bed with no acute distress. Decreased hearing EYES: Pupils equal, round, reactive to light and accommodation. No scleral icterus. Extraocular muscles intact.  HEENT: Head atraumatic, normocephalic. Oropharynx and nasopharynx clear.  NECK:  Supple, no jugular venous distention. No thyroid enlargement, no tenderness.  LUNGS: Increased work of  breathing with bilateral wheezing. Poor air entry. CARDIOVASCULAR: S1, S2 normal. No murmurs, rubs, or gallops.  ABDOMEN: Soft, nontender, nondistended. Bowel sounds present. No organomegaly or mass.  EXTREMITIES: No cyanosis, clubbing. 1+ lower extremity edema bilaterally NEUROLOGIC: Cranial nerves II through XII are intact. No focal Motor or sensory deficits b/l.   PSYCHIATRIC: The patient is alert and oriented x 3.  SKIN: No obvious rash, lesion, or ulcer.   LABORATORY PANEL:   CBC  Recent Labs Lab 07/28/15 0447  WBC 11.6*  HGB 11.6*  HCT 35.2*  PLT 194   ------------------------------------------------------------------------------------------------------------------ Chemistries   Recent Labs Lab 07/29/15 0511  NA 138  K 3.6  CL 105  CO2 26  GLUCOSE 289*  BUN 32*  CREATININE 1.38*  CALCIUM 8.6*  MG 2.2   ------------------------------------------------------------------------------------------------------------------  Cardiac Enzymes  Recent Labs Lab 07/27/15 2228  TROPONINI 0.03   ------------------------------------------------------------------------------------------------------------------  RADIOLOGY:  Dg Chest 2 View  07/27/2015  CLINICAL DATA:  82 year old male with shortness of breath EXAM: CHEST  2 VIEW COMPARISON:  CT dated 06/20/2015 FINDINGS: Two views of the chest demonstrate mildly hyperinflated lungs. There bibasilar linear and ground-glass densities, likely atelectatic changes. Pneumonia is not excluded. There is no focal consolidation, pleural effusion, or pneumothorax. There is cardiomegaly. Median sternotomy wires and CABG vascular clips noted. No acute osseous pathology identified. IMPRESSION: Bibasilar linear and ground-glass densities may represent atelectasis or pneumonia. No focal consolidation Electronically Signed   By: Elgie Collard M.D.   On: 07/27/2015 23:26   Ct Abdomen Pelvis W Contrast  07/28/2015  CLINICAL DATA:  82 year old  male with left-sided abdominal pain and swelling. EXAM: CT ABDOMEN AND PELVIS WITH CONTRAST TECHNIQUE: Multidetector CT imaging of the abdomen and pelvis was performed  using the standard protocol following bolus administration of intravenous contrast. CONTRAST:  80mL ISOVUE-300 IOPAMIDOL (ISOVUE-300) INJECTION 61% COMPARISON:  Chest CT dated 06/20/2015 and abdominal CT 10/13/2008 FINDINGS: Partially visualized trace bilateral pleural effusions. Patchy areas of airspace density at the lung bases most compatible with pneumonia. There is coronary vascular calcification. No intra-abdominal free air or free fluid. The liver, gallbladder appear unremarkable. There is atrophy of pancreas with scant speckled calcifications, likely sequela of chronic pancreatitis. No evidence of active inflammatory changes. There is a 2 cm round hypodense lesion in the distal body/tail of the pancreas which is increased in size compared to the CT dated 10/13/2008. MRI may provide better characterisation. The spleen, adrenal glands appear unremarkable. There is moderate bilateral renal parenchyma and cortical atrophy. There is a 1 cm exophytic hypodense lesion in the upper pole of the right kidney, possibly a cyst. There is no hydronephrosis or nephrolithiasis on either side. The visualized ureters appear unremarkable. There is trabecular appearance of the bladder wall compatible with chronic bladder outlet obstruction. There is heterogeneous appearance of the prostate gland. There is colonic diverticulosis without active inflammatory changes. Constipation. No evidence of bowel obstruction. Normal appendix. There is aortoiliac atherosclerotic disease. No portal venous gas identified. There is no adenopathy. Small fat containing umbilical hernia as well as small fat containing left inguinal hernia. There is osteopenia with degenerative changes of the spine and right hip. No acute fracture. IMPRESSION: Small bilateral pleural effusions and lung  base opacities concerning for pneumonia. Small hypodense lesion in the distal body/ tail of the pancreas with interval growth since 2010. MRI may provide better characterisation. Colonic diverticulosis. Constipation. No bowel obstruction or inflammation. Normal appendix. Trabeculated bladder wall indicative of chronic bladder outlet obstruction. Electronically Signed   By: Elgie CollardArash  Radparvar M.D.   On: 07/28/2015 00:55     ASSESSMENT AND PLAN:   * Bilateral pneumonia with COPD exacerbation and acute hypoxic respiratory failure -IV steroids, Antibiotics - Scheduled Nebulizers - Inhalers -Wean O2 as tolerated - Consult pulmonary if no improvement  * Insulin-dependent diabetes mellitus. Uncontrolled due to IV steroids. IV steroid dose decreased today. Increase sliding scale to resistant scale. On Lantus.  * Chronic systolic CHF is stable  * hypertension Continue home medications  * CKD stage III is stable  * DVT prophylaxis with Lovenox  All the records are reviewed and case discussed with Care Management/Social Workerr. Management plans discussed with the patient, family and they are in agreement.  CODE STATUS: Full code  DVT Prophylaxis: SCDs  TOTAL TIME TAKING CARE OF THIS PATIENT: 35 minutes.   POSSIBLE D/C IN 1-2 DAYS, DEPENDING ON CLINICAL CONDITION.  Milagros LollSudini, Jarryn Altland R M.D on 07/29/2015 at 12:40 PM  Between 7am to 6pm - Pager - [phone]  After 6pm go to [email or web address] - password [email or web address]  Fabio Neighbors[facility]  Office  [phone]  CC: Primary care physician; Pcp Not In System  Note: This dictation was prepared with Dragon dictation along with smaller phrase technology. Any transcriptional errors that result from this process are unintentional.

## 2015-07-29 NOTE — Progress Notes (Signed)
Dr Elpidio AnisSudini made aware of lunch time FSBS, new order 20units novolog once

## 2015-07-29 NOTE — NC FL2 (Signed)
[place] MEDICAID FL2 LEVEL OF CARE SCREENING TOOL     IDENTIFICATION  Patient Name: Martin [name] Birthdate: [name] 01, 1924 Sex: male Admission Date (Current Location): 07/27/2015  [place] Number:  Chiropodist and Address:  [facility], [address] [place], [place] [address]      Provider Number: [id number]  Attending Physician Name and Address:  Milagros Loll, MD  Relative Name and Phone Number:   Martin [name] (daughter) [phone])    Current Level of Care: Hospital Recommended Level of Care: Assisted Living Facility Prior Approval Number:    Date Approved/Denied:   PASRR Number:    Discharge Plan:  (ALF )    Current Diagnoses: Patient Active Problem List   Diagnosis Date Noted  . HCAP (healthcare-associated pneumonia) 06/20/2015  . Sepsis (HCC) 03/14/2015  . Diabetes mellitus type 2 in obese (HCC) 10/01/2014  . Chronic atrial fibrillation (HCC) 10/01/2014  . Chronic kidney disease, stage III (moderate) 10/01/2014  . Chronic systolic (congestive) heart failure (HCC) 10/01/2014  . Peripheral vascular disease (HCC) 10/01/2014  . Coronary artery disease 10/01/2014    Orientation RESPIRATION BLADDER Height & Weight     Self, Time, Situation, Place  O2 (2 Liters) Continent Weight: 250 lb 8 oz (113.626 kg) Height:  6' (182.9 cm)  BEHAVIORAL SYMPTOMS/MOOD NEUROLOGICAL BOWEL NUTRITION STATUS      Continent Diet (Heart healthy/Carb modified)  AMBULATORY STATUS COMMUNICATION OF NEEDS Skin   Limited Assist Verbally Normal                       Personal Care Assistance Level of Assistance  Bathing, Dressing Bathing Assistance: Limited assistance   Dressing Assistance: Limited assistance     Functional Limitations Info  Hearing   Hearing Info: Impaired      SPECIAL CARE FACTORS FREQUENCY                       Contractures Contractures Info: Not present    Additional Factors Info  Code  Status Code Status Info:  (Full)             Current Medications (07/29/2015):  This is the current hospital active medication list Current Facility-Administered Medications  Medication Dose Route Frequency Provider Last Rate Last Dose  . acetaminophen (TYLENOL) tablet 650 mg  650 mg Oral Q6H PRN Oralia Manis, MD       Or  . acetaminophen (TYLENOL) suppository 650 mg  650 mg Rectal Q6H PRN Oralia Manis, MD      . aspirin tablet 325 mg  325 mg Oral Daily Oralia Manis, MD   325 mg at 07/29/15 0745  . azithromycin (ZITHROMAX) tablet 500 mg  500 mg Oral Q24H Milagros Loll, MD   500 mg at 07/28/15 1940  . cefTRIAXone (ROCEPHIN) 1 g in dextrose 5 % 50 mL IVPB  1 g Intravenous Q24H Milagros Loll, MD   1 g at 07/28/15 1708  . furosemide (LASIX) tablet 40 mg  40 mg Oral BID Milagros Loll, MD   40 mg at 07/29/15 0747  . gabapentin (NEURONTIN) capsule 300 mg  300 mg Oral BID Oralia Manis, MD   300 mg at 07/29/15 0745  . gabapentin (NEURONTIN) capsule 600 mg  600 mg Oral Q0600 Oralia Manis, MD   600 mg at 07/28/15 1941  . guaiFENesin-dextromethorphan (ROBITUSSIN DM) 100-10 MG/5ML syrup 15 mL  15 mL Oral Q4H PRN Oralia Manis, MD      .  insulin aspart (novoLOG) injection 0-5 Units  0-5 Units Subcutaneous QHS Oralia Manisavid Willis, MD   4 Units at 07/28/15 2204  . insulin aspart (novoLOG) injection 0-9 Units  0-9 Units Subcutaneous TID WC Oralia Manisavid Willis, MD   5 Units at 07/29/15 0747  . insulin aspart (novoLOG) injection 3 Units  3 Units Subcutaneous TID AC Milagros LollSrikar Sudini, MD   3 Units at 07/29/15 0745  . insulin glargine (LANTUS) injection 24 Units  24 Units Subcutaneous Daily Milagros LollSrikar Sudini, MD   24 Units at 07/29/15 0757  . ipratropium-albuterol (DUONEB) 0.5-2.5 (3) MG/3ML nebulizer solution 3 mL  3 mL Nebulization Q4H PRN Oralia Manisavid Willis, MD   3 mL at 07/29/15 0741  . methylPREDNISolone sodium succinate (SOLU-MEDROL) 125 mg/2 mL injection 60 mg  60 mg Intravenous Q8H Oralia Manisavid Willis, MD   60 mg at 07/29/15 0745  .  ondansetron (ZOFRAN) tablet 4 mg  4 mg Oral Q6H PRN Oralia Manisavid Willis, MD       Or  . ondansetron [facility](ZOFRAN) injection 4 mg  4 mg Intravenous Q6H PRN Oralia Manisavid Willis, MD      . pravastatin (PRAVACHOL) tablet 20 mg  20 mg Oral QHS Oralia Manisavid Willis, MD   20 mg at 07/28/15 2205  . Rivaroxaban (XARELTO) tablet 15 mg  15 mg Oral Daily Milagros LollSrikar Sudini, MD   15 mg at 07/29/15 0748  . senna-docusate (Senokot-S) tablet 1 tablet  1 tablet Oral BID Milagros LollSrikar Sudini, MD   1 tablet at 07/29/15 0746  . sodium chloride flush (NS) 0.9 % injection 3 mL  3 mL Intravenous Q12H Oralia Manisavid Willis, MD   3 mL at 07/29/15 0750  . terazosin (HYTRIN) capsule 2 mg  2 mg Oral QHS Oralia Manisavid Willis, MD   2 mg at 07/28/15 2205     Discharge Medications: Please see discharge summary for a list of discharge medications.  Relevant Imaging Results:  Relevant Lab Results:   Additional Information  (SSN: [id number])  Starr SinclairSamantha L Jaynie Hitch, LCSW

## 2015-07-29 NOTE — Evaluation (Signed)
Clinical/Bedside Swallow Evaluation Patient Details  Name: Martin [name] MRN: [id number] Date of Birth: 05-05-1922  Today's Date: 07/29/2015 Time: SLP Start Time (ACUTE ONLY): 0800 SLP Stop Time (ACUTE ONLY): 0900 SLP Time Calculation (min) (ACUTE ONLY): 60 min  Past Medical History:  Past Medical History  Diagnosis Date  . Coronary artery disease   . CHF (congestive heart failure) (HCC)     ischemic cardiomyopathy- EF 25%  . Hyperlipidemia   . Diabetes mellitus without complication (HCC)   . Atrial fibrillation (HCC)   . BPH (benign prostatic hyperplasia)   . Peripheral vascular disease (HCC)   . Chronic kidney disease   . Insomnia   . Renal insufficiency   . Asthma    Past Surgical History:  Past Surgical History  Procedure Laterality Date  . Cardiac surgery    . Coronary artery bypass graft     HPI:  Pt is a 82 y.o. male w/ h/o CHF, CAD, DM, HOH w/ aids, afib, PVD, chronic kidney dis., and other medical dxs who presents with shortness of breath. Patient states that he was walking and became significantly short of breath. He is resident of a nursing facility and was admitted here to the hospital for pneumonia about one month ago. Evaluation in the ED shows likely new basilar pneumonia. Pt denied any s/s of dysphagia or aspiration w/ po's(Dtr. did as well when speaking w/ her yesterday PM). Pt was seen at his Pulmonologist's office who rec'd a f/u MBSS to r/o dysphagia/aspiration as a relationship to his declined respiratory status. Dtr again denied any trouble swallowing and stated that pt's declined breathing presentation was related to his medical issues and obesity. Dtr also stated pt cannot read.   Assessment / Plan / Recommendation Clinical Impression  Pt appears to adequately tolerate trials of thin liquids and soft solids/solids w/ no overt s/s of aspiration noted; no coughing or throat clearing noted and no decline in respiratory status noted during/post po trials of  liquids/foods. Overall, pt's respiratory status appeared improved from yesterday when meeting him and the Dtr in PM. (NSG reported pt was getting scheduled breathing txs to address respiratory status.) When briefly seeing yesterday at end of lunch meal and w/ Dtr in room in PM, pt did not exhibit overt s/s of aspiration w/ po's. Oral phase has been wfl (at pt's baseline status w/ his dentures) per Dtr. Pt is able to feed self. At this time, d/t pt's presentation during meals and po meds w/ NSG, pt appears at reduced risk for aspiration from an oropharyngeal phase standpoint. Rec. continue w/ current po diet w/ general aspiration precautions. No indication for an objective swallow assessment(MBSS) at this time. Pt can be f/u at AL if any changes in status indicates such need. Dtr seemed in agreement w/ this from discussion yesterday PM.     Aspiration Risk   (reduced)    Diet Recommendation  regular(meats cut small, moist); thin liquids; general aspiration precautions; rec. general reflux precautions.  Medication Administration: Whole meds with liquid (as tolerates)    Other  Recommendations Oral Care Recommendations: Oral care BID;Patient independent with oral care;Staff/trained caregiver to provide oral care   Follow up Recommendations  None    Frequency and Duration            Prognosis Prognosis for Safe Diet Advancement: Good      Swallow Study   General Date of Onset: 07/27/15 HPI: Pt is a 82 y.o. male w/ h/o CHF, CAD, DM,  HOH w/ aids, afib, PVD, chronic kidney dis., and other medical dxs who presents with shortness of breath. Patient states that he was walking and became significantly short of breath. He is resident of a nursing facility and was admitted here to the hospital for pneumonia about one month ago. Evaluation in the ED shows likely new basilar pneumonia. Pt denied any s/s of dysphagia or aspiration w/ po's(Dtr. did as well when speaking w/ her yesterday PM). Pt was seen at  his Pulmonologist's office who rec'd a f/u MBSS to r/o dysphagia/aspiration as a relationship to his declined respiratory status. Dtr again denied any trouble swallowing and stated that pt's declined breathing presentation was related to his medical issues and obesity. Dtr also stated pt cannot read. Type of Study: Bedside Swallow Evaluation Previous Swallow Assessment: none Diet Prior to this Study: Regular;Thin liquids Temperature Spikes Noted: No (wbc 10-11.6 until 3.24.17) Respiratory Status: Nasal cannula (2 liters) History of Recent Intubation: No Behavior/Cognition: Alert;Cooperative;Pleasant mood (HOH) Oral Cavity Assessment: Within Functional Limits Oral Care Completed by SLP: No (eating his breakfast meal currently) Oral Cavity - Dentition: Dentures, top;Dentures, bottom Vision: Functional for self-feeding Self-Feeding Abilities: Able to feed self Patient Positioning: Upright in bed (EOB) Baseline Vocal Quality: Normal Volitional Cough: Strong Volitional Swallow: Able to elicit    Oral/Motor/Sensory Function Overall Oral Motor/Sensory Function: Within functional limits (informal; w/ bolus management)   Ice Chips Ice chips: Not tested   Thin Liquid Thin Liquid: Within functional limits Presentation: Straw;Self Fed (~8 ozs)    Nectar Thick Nectar Thick Liquid: Not tested   Honey Thick Honey Thick Liquid: Not tested   Puree Puree: Not tested   Solid   GO     Solid: Within functional limits Presentation: Self Fed;Spoon (all breakfast foods(solids) on tray; then a banana)       Martin SomKatherine Rashema Seawright, MS, CCC-SLP  An[place]ula Severt 07/29/2015,10:03 AM

## 2015-07-29 NOTE — Progress Notes (Signed)
Dr Elpidio AnisSudini made aware that pt had a 5 beat run of vtach, pt asymptomatic, no complaints noted, acknowledged no new orders

## 2015-07-30 LAB — GLUCOSE, CAPILLARY
GLUCOSE-CAPILLARY: 263 mg/dL — AB (ref 65–99)
GLUCOSE-CAPILLARY: 198 mg/dL — AB (ref 65–99)
Glucose-Capillary: 145 mg/dL — ABNORMAL HIGH (ref 65–99)
Glucose-Capillary: 229 mg/dL — ABNORMAL HIGH (ref 65–99)

## 2015-07-30 MED ORDER — TRIAZOLAM 0.25 MG PO TABS
0.5000 mg | ORAL_TABLET | Freq: Every day | ORAL | Status: DC
  Administered 2015-07-30: 22:00:00 0.5 mg via ORAL
  Filled 2015-07-30: qty 2

## 2015-07-30 MED ORDER — LEVOFLOXACIN 750 MG PO TABS
750.0000 mg | ORAL_TABLET | Freq: Once | ORAL | Status: AC
  Administered 2015-07-30: 750 mg via ORAL
  Filled 2015-07-30: qty 1

## 2015-07-30 MED ORDER — LEVOFLOXACIN 750 MG PO TABS: 750.0000 mg | ORAL_TABLET | ORAL | Status: DC

## 2015-07-30 MED ORDER — PREDNISONE 20 MG PO TABS
50.0000 mg | ORAL_TABLET | Freq: Every day | ORAL | Status: DC
  Administered 2015-07-30 – 2015-07-31 (×2): 50 mg via ORAL
  Filled 2015-07-30 (×2): qty 2

## 2015-07-30 NOTE — Consult Note (Signed)
Pharmacy Antibiotic Note  Martin [name] is a 82 y.o. male admitted on 07/27/2015 with pneumonia.  Pharmacy has been consulted for levofloxacin dosing.  Plan: levofloxacin 750mg  q 48 hours based on renal function and indication  Height: 6' (182.9 cm) Weight: 250 lb 8 oz (113.626 kg) IBW/kg (Calculated) : 77.6  Temp (24hrs), Avg:98 F (36.7 C), Min:97.6 F (36.4 C), Max:98.7 F (37.1 C)   Recent Labs Lab 07/27/15 2228 07/28/15 0447 07/29/15 0511  WBC 10.0 11.6*  --   CREATININE 1.44* 1.36* 1.38*    Estimated Creatinine Clearance: 45.4 mL/min (by C-G formula based on Cr of 1.38).    No Known Allergies  Antimicrobials this admission: ceftriaxone 3/24 >> 3/26 azithromyicin 3/24 >> 3/26 levofloxacin 3/26>>  Dose adjustments this admission:   Microbiology results: 3/24 BCx: no growth 1 day  3/24 MRSA PCR: neg  Thank you for allowing pharmacy to be a part of this patient's care.  Martin [name], Pharm.D Clinical Pharmacist   07/30/2015 12:13 PM

## 2015-07-30 NOTE — Progress Notes (Signed)
[facility] - [place] at [facility]   PATIENT NAME: Martin [name]    MR#:  [id number]  DATE OF BIRTH:  [birth year]  SUBJECTIVE:  CHIEF COMPLAINT:   Chief Complaint  Patient presents with  . Shortness of Breath   Decreased hearing Continues to have wheezing. Good appetite. On 2 L oxygen.  REVIEW OF SYSTEMS:    Review of Systems  Constitutional: Positive for malaise/fatigue. Negative for fever and chills.  HENT: Negative for sore throat.   Eyes: Negative for blurred vision, double vision and pain.  Respiratory: Positive for cough, shortness of breath and wheezing. Negative for hemoptysis.   Cardiovascular: Negative for chest pain, palpitations, orthopnea and leg swelling.  Gastrointestinal: Negative for heartburn, nausea, vomiting, abdominal pain, diarrhea and constipation.  Genitourinary: Negative for dysuria and hematuria.  Musculoskeletal: Negative for back pain and joint pain.  Skin: Negative for rash.  Neurological: Positive for weakness. Negative for sensory change, speech change, focal weakness and headaches.  Endo/Heme/Allergies: Does not bruise/bleed easily.  Psychiatric/Behavioral: Negative for depression. The patient is not nervous/anxious.     DRUG ALLERGIES:  No Known Allergies  VITALS:  Blood pressure 150/82, pulse 94, temperature 98.7 F (37.1 C), temperature source Oral, resp. rate 20, height 6' (1.829 m), weight 113.626 kg (250 lb 8 oz), SpO2 94 %.  PHYSICAL EXAMINATION:   Physical Exam  GENERAL:  82 y.o.-year-old patient lying in the bed with no acute distress. Decreased hearing EYES: Pupils equal, round, reactive to light and accommodation. No scleral icterus. Extraocular muscles intact.  HEENT: Head atraumatic, normocephalic. Oropharynx and nasopharynx clear.  NECK:  Supple, no jugular venous distention. No thyroid enlargement, no tenderness.  LUNGS:bilateral wheezing. Good air entry. CARDIOVASCULAR: S1, S2 normal. No  murmurs, rubs, or gallops.  ABDOMEN: Soft, nontender, nondistended. Bowel sounds present. No organomegaly or mass.  EXTREMITIES: No cyanosis, clubbing. 1+ lower extremity edema bilaterally NEUROLOGIC: Cranial nerves II through XII are intact. No focal Motor or sensory deficits b/l.   PSYCHIATRIC: The patient is alert and oriented x 3.  SKIN: No obvious rash, lesion, or ulcer.   LABORATORY PANEL:   CBC  Recent Labs Lab 07/28/15 0447  WBC 11.6*  HGB 11.6*  HCT 35.2*  PLT 194   ------------------------------------------------------------------------------------------------------------------ Chemistries   Recent Labs Lab 07/29/15 0511  NA 138  K 3.6  CL 105  CO2 26  GLUCOSE 289*  BUN 32*  CREATININE 1.38*  CALCIUM 8.6*  MG 2.2   ------------------------------------------------------------------------------------------------------------------  Cardiac Enzymes  Recent Labs Lab 07/27/15 2228  TROPONINI 0.03   ------------------------------------------------------------------------------------------------------------------  RADIOLOGY:  No results found.   ASSESSMENT AND PLAN:   * Bilateral pneumonia with COPD exacerbation and acute hypoxic respiratory failure -IV steroids, Antibiotics - Scheduled Nebulizers - Inhalers -Wean O2 as tolerated  We'll switch antibiotics and steroids to oral today. Likely discharge tomorrow. Improving. May need oxygen at discharge.  * Insulin-dependent diabetes mellitus. Uncontrolled due to IV steroids. Resistant sliding scale On Lantus.  * Chronic systolic CHF is stable On Lasix  * Proximal atrial fibrillation is stable  * hypertension Continue home medications  * CKD stage III is stable  * DVT prophylaxis with Lovenox  All the records are reviewed and case discussed with Care Management/Social Workerr. Management plans discussed with the patient, family and they are in agreement.  CODE STATUS: Full code  DVT  Prophylaxis: SCDs  TOTAL TIME TAKING CARE OF THIS PATIENT: 30 minutes.   POSSIBLE D/C TOMORROW DEPENDING ON CLINICAL CONDITION.  Milagros LollSudini, Gwen Edler R M.D on 07/30/2015 at 12:13 PM  Between 7am to 6pm - Pager - [phone]  After 6pm go to [email or web address] - password [email or web address]  Fabio Neighbors[facility]  Office  [phone]  CC: Primary care physician; Pcp Not In System  Note: This dictation was prepared with Dragon dictation along with smaller phrase technology. Any transcriptional errors that result from this process are unintentional.

## 2015-07-31 LAB — GLUCOSE, CAPILLARY
Glucose-Capillary: 180 mg/dL — ABNORMAL HIGH (ref 65–99)
Glucose-Capillary: 277 mg/dL — ABNORMAL HIGH (ref 65–99)
Glucose-Capillary: 262 mg/dL — ABNORMAL HIGH (ref 65–99)

## 2015-07-31 MED ORDER — PREDNISONE 50 MG PO TABS: 50.0000 mg | ORAL_TABLET | Freq: Every day | ORAL | Status: DC

## 2015-07-31 MED ORDER — LEVOFLOXACIN 500 MG PO TABS: 500.0000 mg | ORAL_TABLET | Freq: Every day | ORAL | Status: DC

## 2015-07-31 NOTE — Care Management Important Message (Signed)
Important Message  Patient Details  Name: Martin [name] MRN: [id number] Date of Birth: Feb 03, 1922   Medicare Important Message Given:  Yes    Collie SiadAngela Rajveer Handler, RN 07/31/2015, 1:51 PM

## 2015-07-31 NOTE — Discharge Instructions (Addendum)
°  DIET:  Cardiac diet and Diabetic diet  DISCHARGE CONDITION:  Stable  ACTIVITY:  Activity as tolerated  OXYGEN:  Home Oxygen: Yes.     Oxygen Delivery: No  DISCHARGE LOCATION:  Assisted living   If you experience worsening of your admission symptoms, develop shortness of breath, life threatening emergency, suicidal or homicidal thoughts you must seek medical attention immediately by calling 911 or calling your MD immediately  if symptoms less severe.  You Must read complete instructions/literature along with all the possible adverse reactions/side effects for all the Medicines you take and that have been prescribed to you. Take any new Medicines after you have completely understood and accpet all the possible adverse reactions/side effects.   Please note  You were cared for by a hospitalist during your hospital stay. If you have any questions about your discharge medications or the care you received while you were in the hospital after you are discharged, you can call the unit and asked to speak with the hospitalist on call if the hospitalist that took care of you is not available. Once you are discharged, your primary care physician will handle any further medical issues. Please note that NO REFILLS for any discharge medications will be authorized once you are discharged, as it is imperative that you return to your primary care physician (or establish a relationship with a primary care physician if you do not have one) for your aftercare needs so that they can reassess your need for medications and monitor your lab values.

## 2015-07-31 NOTE — NC FL2 (Signed)
MEDICAID FL2 LEVEL OF CARE SCREENING TOOL     IDENTIFICATION  Patient Name: Martin [name] Birthdate: 1921-11-28 Sex: male Admission Date (Current Location): 07/27/2015  [place] and [place] Number:  Chiropodist and Address:  [facility], [address], [place], [place] [address]      Provider Number: [id number]  Attending Physician Name and Address:  Milagros Loll, MD  Relative Name and Phone Number:   [name] (daughter) [phone])    Current Level of Care: Hospital Recommended Level of Care: Assisted Living Facility Prior Approval Number:    Date Approved/Denied:   PASRR Number:    Discharge Plan: Domiciliary (Rest home)    Current Diagnoses: Patient Active Problem List   Diagnosis Date Noted  . HCAP (healthcare-associated pneumonia) 06/20/2015  . Sepsis (HCC) 03/14/2015  . Diabetes mellitus type 2 in obese (HCC) 10/01/2014  . Chronic atrial fibrillation (HCC) 10/01/2014  . Chronic kidney disease, stage III (moderate) 10/01/2014  . Chronic systolic (congestive) heart failure (HCC) 10/01/2014  . Peripheral vascular disease (HCC) 10/01/2014  . Coronary artery disease 10/01/2014    Orientation RESPIRATION BLADDER Height & Weight     Self, Time, Situation, Place  Normal Continent Weight: 250 lb 8 oz (113.626 kg) Height:  6' (182.9 cm)  BEHAVIORAL SYMPTOMS/MOOD NEUROLOGICAL BOWEL NUTRITION STATUS      Continent Diet (Heart Healthy/Carb Modified)  AMBULATORY STATUS COMMUNICATION OF NEEDS Skin   Limited Assist Verbally Normal                       Personal Care Assistance Level of Assistance  Bathing, Feeding, Dressing Bathing Assistance: Limited assistance Feeding assistance: Independent Dressing Assistance: Limited assistance     Functional Limitations Info  Sight, Hearing, Speech Sight Info: Adequate Hearing Info: Impaired Speech Info: Adequate    SPECIAL CARE FACTORS FREQUENCY   Insulin  Scheduled    Contractures Contractures Info: Not present    Additional Factors Info  Code Status, Allergies Code Status Info: Full Code Allergies Info: No known allergies           Current Medications (07/31/2015):  This is the current hospital active medication list Current Facility-Administered Medications  Medication Dose Route Frequency Provider Last Rate Last Dose  . acetaminophen (TYLENOL) tablet 650 mg  650 mg Oral Q6H PRN Oralia Manis, MD       Or  . acetaminophen (TYLENOL) suppository 650 mg  650 mg Rectal Q6H PRN Oralia Manis, MD      . aspirin tablet 325 mg  325 mg Oral Daily Oralia Manis, MD   325 mg at 07/31/15 0830  . furosemide (LASIX) tablet 40 mg  40 mg Oral BID Milagros Loll, MD   40 mg at 07/31/15 0831  . gabapentin (NEURONTIN) capsule 300 mg  300 mg Oral BID Oralia Manis, MD   300 mg at 07/31/15 1326  . gabapentin (NEURONTIN) capsule 600 mg  600 mg Oral Q0600 Oralia Manis, MD   600 mg at 07/30/15 2222  . guaiFENesin-dextromethorphan (ROBITUSSIN DM) 100-10 MG/5ML syrup 15 mL  15 mL Oral Q4H PRN Oralia Manis, MD      . insulin aspart (novoLOG) injection 0-20 Units  0-20 Units Subcutaneous TID WC Milagros Loll, MD   11 Units at 07/31/15 1200  . insulin aspart (novoLOG) injection 0-5 Units  0-5 Units Subcutaneous QHS Oralia Manis, MD   2 Units at 07/30/15 2221  . insulin aspart (novoLOG) injection 3 Units  3 Units Subcutaneous TID AC Milagros LollSrikar Sudini, MD   3 Units at 07/31/15 1200  . insulin glargine (LANTUS) injection 24 Units  24 Units Subcutaneous Daily Milagros LollSrikar Sudini, MD   24 Units at 07/31/15 0831  . ipratropium-albuterol (DUONEB) 0.5-2.5 (3) MG/3ML nebulizer solution 3 mL  3 mL Nebulization Q4H PRN Oralia Manisavid Willis, MD   3 mL at 07/29/15 0741  . [START ON 08/01/2015] levofloxacin (LEVAQUIN) tablet 750 mg  750 mg Oral Q48H Srikar Sudini, MD      . ondansetron [facility](ZOFRAN) tablet 4 mg  4 mg Oral Q6H PRN Oralia Manisavid Willis, MD       Or  . ondansetron [facility](ZOFRAN) injection 4 mg  4 mg  Intravenous Q6H PRN Oralia Manisavid Willis, MD      . pravastatin (PRAVACHOL) tablet 20 mg  20 mg Oral QHS Oralia Manisavid Willis, MD   20 mg at 07/30/15 2222  . predniSONE (DELTASONE) tablet 50 mg  50 mg Oral Q breakfast Milagros LollSrikar Sudini, MD   50 mg at 07/31/15 0830  . Rivaroxaban (XARELTO) tablet 15 mg  15 mg Oral Daily Milagros LollSrikar Sudini, MD   15 mg at 07/31/15 0830  . senna-docusate (Senokot-S) tablet 1 tablet  1 tablet Oral BID Milagros LollSrikar Sudini, MD   1 tablet at 07/31/15 0830  . terazosin (HYTRIN) capsule 2 mg  2 mg Oral QHS Oralia Manisavid Willis, MD   2 mg at 07/30/15 2222  . triazolam (HALCION) tablet 0.5 mg  0.5 mg Oral QHS Milagros LollSrikar Sudini, MD   0.5 mg at 07/30/15 2222     Discharge Medications: Please see discharge summary for a list of discharge medications.  Current Discharge Medication List    START taking these medications   Details  predniSONE (DELTASONE) 50 MG tablet Take 1 tablet (50 mg total) by mouth daily with breakfast. Qty: 5 tablet, Refills: 0      CONTINUE these medications which have CHANGED   Details  levofloxacin (LEVAQUIN) 500 MG tablet Take 1 tablet (500 mg total) by mouth daily. Qty: 4 tablet, Refills: 0      CONTINUE these medications which have NOT CHANGED   Details  albuterol (PROVENTIL HFA;VENTOLIN HFA) 108 (90 BASE) MCG/ACT inhaler Inhale 2 puffs into the lungs every 6 (six) hours as needed for wheezing or shortness of breath. Qty: 1 Inhaler, Refills: 2    aspirin 325 MG tablet Take 325 mg by mouth daily.     dextromethorphan-guaiFENesin (MUCINEX DM) 30-600 MG 12hr tablet Take 1 tablet by mouth every 12 (twelve) hours.    dextrose (GLUTOSE) 40 % GEL Take 1 Tube by mouth as needed for low blood sugar.    docusate sodium (COLACE) 100 MG capsule Take 100 mg by mouth 2 (two) times daily.    furosemide (LASIX) 40 MG tablet Take 1 tablet (40 mg total) by mouth daily. Qty: 30 tablet, Refills: 0    gabapentin (NEURONTIN) 300 MG capsule Take 300-600 mg  by mouth 3 (three) times daily. Take 1 capsule at 0900 & 1400 and 2 capsules at 2000.    guaiFENesin-dextromethorphan (ROBITUSSIN DM) 100-10 MG/5ML syrup Take 15 mLs by mouth every 4 (four) hours as needed for cough.    insulin aspart (NOVOLOG) 100 UNIT/ML injection Inject 3 Units into the skin 3 (three) times daily with meals. Qty: 10 mL, Refills: 11    insulin glargine (LANTUS) 100 UNIT/ML injection Inject 0.32 mLs (32 Units total) into the skin daily. Qty: 10 mL, Refills: 11    ipratropium-albuterol (DUONEB) 0.5-2.5 (3)  MG/3ML SOLN Take 3 mLs by nebulization every 6 (six) hours as needed. Qty: 360 mL, Refills: 0    nitroGLYCERIN (NITROSTAT) 0.4 MG SL tablet Place 1 tablet (0.4 mg total) under the tongue every 5 (five) minutes as needed for chest pain. Qty: 30 tablet, Refills: 1    oxyCODONE-acetaminophen (PERCOCET/ROXICET) 5-325 MG tablet Take 0.5 tablets by mouth at bedtime. Pt also takes one-half tablet every six hours as needed for moderate/severe pain. Qty: 30 tablet, Refills: 0    polyethylene glycol (MIRALAX / GLYCOLAX) packet Take 17 g by mouth every Monday, Wednesday, and Friday.     pravastatin (PRAVACHOL) 20 MG tablet Take 20 mg by mouth at bedtime.     rivaroxaban (XARELTO) 20 MG TABS tablet Take 20 mg by mouth daily.    terazosin (HYTRIN) 2 MG capsule Take 2 mg by mouth at bedtime.     triazolam (HALCION) 0.25 MG tablet Take 0.5 mg by mouth at bedtime.     vitamin B-12 (CYANOCOBALAMIN) 500 MCG tablet Take 500 mcg by mouth daily.         Relevant Imaging Results:  Relevant Lab Results:   Additional Information SSN:  [id number]  Dede Query, LCSW

## 2015-07-31 NOTE — Progress Notes (Signed)
Patient refused to have blood sugar taken, insulin to be given, and refused meal due to being discharged.  Nurse explained to patient that it would be a good idea to eat since we did not know when family would arrive to transport patient.  Martin [name] was witness that patient refused meal.

## 2015-07-31 NOTE — Clinical Social Work Note (Signed)
Pt is ready for discharge today and will [facility] [address]. Facility has received discharge information and is ready to accept pt. Pt's son in law, Jonny RuizJohn, will provide transportation. Pt is in agreement with discharge plan. Pt's daughter, Pershing Proudoyna, is aware as well. CSW is signing off as no further needs identified.   Dede QuerySarah Verba Ainley, MSW, LCSW Clinical Social Worker  [phone]

## 2015-07-31 NOTE — Discharge Summary (Addendum)
[facility]   PATIENT NAME: Martin [name]    MR#:  [id number]  DATE OF BIRTH:  01-27-1922  DATE OF ADMISSION:  07/27/2015 ADMITTING PHYSICIAN: Oralia Manis, MD  DATE OF DISCHARGE: 07/31/2015  PRIMARY CARE PHYSICIAN: Pcp Not In System   ADMISSION DIAGNOSIS:  SOB (shortness of breath) [R06.02] Community acquired pneumonia [J18.9] Constipation, unspecified constipation type [K59.00]  DISCHARGE DIAGNOSIS:  Principal Problem:   HCAP (healthcare-associated pneumonia) Active Problems:   Diabetes mellitus type 2 in obese (HCC)   Chronic atrial fibrillation (HCC)   Chronic kidney disease, stage III (moderate)   Chronic systolic (congestive) heart failure (HCC)   Coronary artery disease   SECONDARY DIAGNOSIS:   Past Medical History  Diagnosis Date  . Coronary artery disease   . CHF (congestive heart failure) (HCC)     ischemic cardiomyopathy- EF 25%  . Hyperlipidemia   . Diabetes mellitus without complication (HCC)   . Atrial fibrillation (HCC)   . BPH (benign prostatic hyperplasia)   . Peripheral vascular disease (HCC)   . Chronic kidney disease   . Insomnia   . Renal insufficiency   . Asthma      ADMITTING HISTORY  [name] is a 82 y.o. male who presents with shortness of breath. Patient states that he was walking and became significantly short of breath. He is resident of a nursing facility and was admitted here to the hospital for pneumonia about one month ago. Evaluation in the ED shows likely new basilar pneumonia. Hospitalists were called for admission.  HOSPITAL COURSE:   * Bilateral pneumonia with COPD exacerbation and acute hypoxic respiratory failure -IV steroids, Antibiotics in hospital - Scheduled Nebulizers - Inhalers - Patient was able to be weaned off oxygen with saturations greater than 90% on ambulation on room air.  Change to levaquin and prednisone PO after discharge for 5 days  *  Insulin-dependent diabetes mellitus. Uncontrolled due to IV steroids. On Lantus.  * Chronic systolic CHF is stable On Lasix  * Proximal atrial fibrillation is stable  * hypertension Continue home medications  * CKD stage III is stable  * DVT prophylaxis with Lovenox  Stable for discharge home  High risk for re admission due to COPD  CONSULTS OBTAINED:     DRUG ALLERGIES:  No Known Allergies  DISCHARGE MEDICATIONS:   Current Discharge Medication List    START taking these medications   Details  predniSONE (DELTASONE) 50 MG tablet Take 1 tablet (50 mg total) by mouth daily with breakfast. Qty: 5 tablet, Refills: 0      CONTINUE these medications which have CHANGED   Details  levofloxacin (LEVAQUIN) 500 MG tablet Take 1 tablet (500 mg total) by mouth daily. Qty: 4 tablet, Refills: 0      CONTINUE these medications which have NOT CHANGED   Details  albuterol (PROVENTIL HFA;VENTOLIN HFA) 108 (90 BASE) MCG/ACT inhaler Inhale 2 puffs into the lungs every 6 (six) hours as needed for wheezing or shortness of breath. Qty: 1 Inhaler, Refills: 2    aspirin 325 MG tablet Take 325 mg by mouth daily.     dextromethorphan-guaiFENesin (MUCINEX DM) 30-600 MG 12hr tablet Take 1 tablet by mouth every 12 (twelve) hours.    dextrose (GLUTOSE) 40 % GEL Take 1 Tube by mouth as needed for low blood sugar.    docusate sodium (COLACE) 100 MG capsule Take 100 mg by mouth 2 (two) times daily.    furosemide (LASIX) 40  MG tablet Take 1 tablet (40 mg total) by mouth daily. Qty: 30 tablet, Refills: 0    gabapentin (NEURONTIN) 300 MG capsule Take 300-600 mg by mouth 3 (three) times daily. Take 1 capsule at 0900 & 1400 and 2 capsules at 2000.    guaiFENesin-dextromethorphan (ROBITUSSIN DM) 100-10 MG/5ML syrup Take 15 mLs by mouth every 4 (four) hours as needed for cough.    insulin aspart (NOVOLOG) 100 UNIT/ML injection Inject 3 Units into the skin 3 (three) times daily with meals. Qty: 10  mL, Refills: 11    insulin glargine (LANTUS) 100 UNIT/ML injection Inject 0.32 mLs (32 Units total) into the skin daily. Qty: 10 mL, Refills: 11    ipratropium-albuterol (DUONEB) 0.5-2.5 (3) MG/3ML SOLN Take 3 mLs by nebulization every 6 (six) hours as needed. Qty: 360 mL, Refills: 0    nitroGLYCERIN (NITROSTAT) 0.4 MG SL tablet Place 1 tablet (0.4 mg total) under the tongue every 5 (five) minutes as needed for chest pain. Qty: 30 tablet, Refills: 1    oxyCODONE-acetaminophen (PERCOCET/ROXICET) 5-325 MG tablet Take 0.5 tablets by mouth at bedtime. Pt also takes one-half tablet every six hours as needed for moderate/severe pain. Qty: 30 tablet, Refills: 0    polyethylene glycol (MIRALAX / GLYCOLAX) packet Take 17 g by mouth every Monday, Wednesday, and Friday.     pravastatin (PRAVACHOL) 20 MG tablet Take 20 mg by mouth at bedtime.     rivaroxaban (XARELTO) 20 MG TABS tablet Take 20 mg by mouth daily.    terazosin (HYTRIN) 2 MG capsule Take 2 mg by mouth at bedtime.     triazolam (HALCION) 0.25 MG tablet Take 0.5 mg by mouth at bedtime.     vitamin B-12 (CYANOCOBALAMIN) 500 MCG tablet Take 500 mcg by mouth daily.        Today   VITAL SIGNS:  Blood pressure 146/100, pulse 70, temperature 98 F (36.7 C), temperature source Oral, resp. rate 20, height 6' (1.829 m), weight 113.626 kg (250 lb 8 oz), SpO2 94 %.  I/O:   Intake/Output Summary (Last 24 hours) at 07/31/15 1241 Last data filed at 07/31/15 1048  Gross per 24 hour  Intake    240 ml  Output   1750 ml  Net  -1510 ml    PHYSICAL EXAMINATION:  Physical Exam  GENERAL:  82 y.o.-year-old patient lying in the bed with no acute distress. Decreased hearing LUNGS: Normal breath sounds bilaterally. Mild Expiratory wheezing CARDIOVASCULAR: S1, S2 normal. No murmurs, rubs, or gallops.  ABDOMEN: Soft, non-tender, non-distended. Bowel sounds present. No organomegaly or mass.  NEUROLOGIC: Moves all 4 extremities. PSYCHIATRIC:  The patient is alert and oriented x 3.  SKIN: No obvious rash, lesion, or ulcer.   DATA REVIEW:   CBC  Recent Labs Lab 07/28/15 0447  WBC 11.6*  HGB 11.6*  HCT 35.2*  PLT 194    Chemistries   Recent Labs Lab 07/29/15 0511  NA 138  K 3.6  CL 105  CO2 26  GLUCOSE 289*  BUN 32*  CREATININE 1.38*  CALCIUM 8.6*  MG 2.2    Cardiac Enzymes  Recent Labs Lab 07/27/15 2228  TROPONINI 0.03    Microbiology Results  Results for orders placed or performed during the hospital encounter of 07/27/15  Blood culture (routine x 2)     Status: None (Preliminary result)   Collection Time: 07/28/15 12:42 AM  Result Value Ref Range Status   Specimen Description BLOOD RIGHT ANTECUBITAL  Final  Special Requests   Final    BOTTLES DRAWN AEROBIC AND ANAEROBIC ,   Culture NO GROWTH 3 DAYS  Final   Report Status PENDING  Incomplete  Blood culture (routine x 2)     Status: None (Preliminary result)   Collection Time: 07/28/15 12:42 AM  Result Value Ref Range Status   Specimen Description BLOOD LEFT ANTECUBITAL  Final   Special Requests BOTTLES DRAWN AEROBIC AND ANAEROBIC  Final   Culture NO GROWTH 3 DAYS  Final   Report Status PENDING  Incomplete  MRSA PCR Screening     Status: Abnormal   Collection Time: 07/28/15  3:20 AM  Result Value Ref Range Status   MRSA by PCR (A) NEGATIVE Final    INVALID, UNABLE TO DETERMINE THE PRESENCE OF TARGET DNA DUE TO SPECIMEN INTEGRITY. RECOLLECTION REQUESTED.    Comment:        The GeneXpert MRSA Assay (FDA approved for NASAL specimens only), is one component of a comprehensive MRSA colonization surveillance program. It is not intended to diagnose MRSA infection nor to guide or monitor treatment for MRSA infections. NOTIFIED DRUSILLA JACKSON ON 07/28/15 AT 0620. QSD   MRSA PCR Screening     Status: None   Collection Time: 07/28/15  6:23 AM  Result Value Ref Range Status   MRSA by PCR NEGATIVE NEGATIVE Final     Comment:        The GeneXpert MRSA Assay (FDA approved for NASAL specimens only), is one component of a comprehensive MRSA colonization surveillance program. It is not intended to diagnose MRSA infection nor to guide or monitor treatment for MRSA infections.     RADIOLOGY:  No results found.  Follow up with PCP in 1 week.  Management plans discussed with the patient, family and they are in agreement.  CODE STATUS:     Code Status Orders        Start     Ordered   07/28/15 0259  Full code   Continuous     07/28/15 0258    Code Status History    Date Active Date Inactive Code Status Order ID Comments User Context   06/20/2015 12:04 PM 06/23/2015  5:10 PM Full Code [id number]  Milagros Loll, MD ED   03/14/2015 10:19 PM 03/18/2015  5:46 PM Full Code [id number]  Wyatt Haste, MD ED   10/02/2014  3:05 AM 10/02/2014  5:53 PM Full Code [id number]  Gale Journey, MD Inpatient   09/10/2014 12:05 PM 09/12/2014  9:31 PM Full Code [id number]  Altamese Dilling, MD Inpatient      TOTAL TIME TAKING CARE OF THIS PATIENT ON DAY OF DISCHARGE: more than 30 minutes.   Milagros Loll R M.D on 07/31/2015 at 12:41 PM  Between 7am to 6pm - Pager - [phone]  After 6pm go to [email or web address] - password [email or web address]  [facility]  Office  [phone]  CC: Primary care physician; Pcp Not In System  Note: This dictation was prepared with Dragon dictation along with smaller phrase technology. Any transcriptional errors that result from this process are unintentional.

## 2015-07-31 NOTE — Progress Notes (Signed)
Discharge instructions given to son in law with verbalized understanding.  Patient taken to visitors entrance in wheelchair by nurse aid to be taken to [name] via son in laws personal vehicle.

## 2015-07-31 NOTE — Care Management (Signed)
Per RN patient will not qualify for home O2- sats with ambulation >90% on room air.I have [name] [phone]. [facility] [place]--home health not ordered; MD paged regarding O2 order and no home health.Per RN patient concerned with costs of O2 and home health.  They will not provide transportation to home after 3:30PM. Family not available per RN. CSW will assist with discharge back to facility.

## 2015-08-02 LAB — CULTURE, BLOOD (ROUTINE X 2)
CULTURE: NO GROWTH
CULTURE: NO GROWTH

## 2015-08-03 ENCOUNTER — Ambulatory Visit: Payer: Medicare Other

## 2015-08-10 ENCOUNTER — Ambulatory Visit: Payer: Self-pay | Admitting: Family

## 2015-08-26 ENCOUNTER — Inpatient Hospital Stay
Admission: EM | Admit: 2015-08-26 | Discharge: 2015-08-29 | DRG: 291 | Payer: Medicare Other | Attending: Specialist | Admitting: Specialist

## 2015-08-26 ENCOUNTER — Encounter: Payer: Self-pay | Admitting: Emergency Medicine

## 2015-08-26 ENCOUNTER — Emergency Department: Payer: Medicare Other

## 2015-08-26 DIAGNOSIS — I248 Other forms of acute ischemic heart disease: Secondary | ICD-10-CM | POA: Diagnosis present

## 2015-08-26 DIAGNOSIS — J441 Chronic obstructive pulmonary disease with (acute) exacerbation: Secondary | ICD-10-CM | POA: Diagnosis present

## 2015-08-26 DIAGNOSIS — J45909 Unspecified asthma, uncomplicated: Secondary | ICD-10-CM | POA: Diagnosis present

## 2015-08-26 DIAGNOSIS — E1169 Type 2 diabetes mellitus with other specified complication: Secondary | ICD-10-CM

## 2015-08-26 DIAGNOSIS — Z7901 Long term (current) use of anticoagulants: Secondary | ICD-10-CM

## 2015-08-26 DIAGNOSIS — Z79899 Other long term (current) drug therapy: Secondary | ICD-10-CM | POA: Diagnosis not present

## 2015-08-26 DIAGNOSIS — I255 Ischemic cardiomyopathy: Secondary | ICD-10-CM | POA: Diagnosis present

## 2015-08-26 DIAGNOSIS — Z794 Long term (current) use of insulin: Secondary | ICD-10-CM | POA: Diagnosis not present

## 2015-08-26 DIAGNOSIS — I739 Peripheral vascular disease, unspecified: Secondary | ICD-10-CM | POA: Diagnosis present

## 2015-08-26 DIAGNOSIS — E669 Obesity, unspecified: Secondary | ICD-10-CM | POA: Diagnosis present

## 2015-08-26 DIAGNOSIS — E785 Hyperlipidemia, unspecified: Secondary | ICD-10-CM | POA: Diagnosis present

## 2015-08-26 DIAGNOSIS — E1122 Type 2 diabetes mellitus with diabetic chronic kidney disease: Secondary | ICD-10-CM | POA: Diagnosis present

## 2015-08-26 DIAGNOSIS — Z833 Family history of diabetes mellitus: Secondary | ICD-10-CM | POA: Diagnosis not present

## 2015-08-26 DIAGNOSIS — N183 Chronic kidney disease, stage 3 unspecified: Secondary | ICD-10-CM | POA: Diagnosis present

## 2015-08-26 DIAGNOSIS — I5023 Acute on chronic systolic (congestive) heart failure: Principal | ICD-10-CM | POA: Diagnosis present

## 2015-08-26 DIAGNOSIS — I2510 Atherosclerotic heart disease of native coronary artery without angina pectoris: Secondary | ICD-10-CM | POA: Diagnosis present

## 2015-08-26 DIAGNOSIS — I482 Chronic atrial fibrillation, unspecified: Secondary | ICD-10-CM | POA: Diagnosis present

## 2015-08-26 DIAGNOSIS — Z7982 Long term (current) use of aspirin: Secondary | ICD-10-CM | POA: Diagnosis not present

## 2015-08-26 DIAGNOSIS — Z951 Presence of aortocoronary bypass graft: Secondary | ICD-10-CM

## 2015-08-26 DIAGNOSIS — I447 Left bundle-branch block, unspecified: Secondary | ICD-10-CM | POA: Diagnosis present

## 2015-08-26 DIAGNOSIS — N400 Enlarged prostate without lower urinary tract symptoms: Secondary | ICD-10-CM | POA: Diagnosis present

## 2015-08-26 DIAGNOSIS — J189 Pneumonia, unspecified organism: Secondary | ICD-10-CM | POA: Diagnosis present

## 2015-08-26 DIAGNOSIS — J440 Chronic obstructive pulmonary disease with acute lower respiratory infection: Secondary | ICD-10-CM | POA: Diagnosis present

## 2015-08-26 DIAGNOSIS — R0600 Dyspnea, unspecified: Secondary | ICD-10-CM

## 2015-08-26 DIAGNOSIS — R609 Edema, unspecified: Secondary | ICD-10-CM

## 2015-08-26 DIAGNOSIS — J9601 Acute respiratory failure with hypoxia: Secondary | ICD-10-CM | POA: Diagnosis present

## 2015-08-26 LAB — BASIC METABOLIC PANEL
ANION GAP: 8 (ref 5–15)
BUN: 28 mg/dL — ABNORMAL HIGH (ref 6–20)
CHLORIDE: 107 mmol/L (ref 101–111)
CO2: 24 mmol/L (ref 22–32)
Calcium: 9.2 mg/dL (ref 8.9–10.3)
Creatinine, Ser: 1.39 mg/dL — ABNORMAL HIGH (ref 0.61–1.24)
GFR calc non Af Amer: 43 mL/min — ABNORMAL LOW (ref >60)
GFR, EST AFRICAN AMERICAN: 49 mL/min — AB (ref >60)
Glucose, Bld: 289 mg/dL — ABNORMAL HIGH (ref 65–99)
POTASSIUM: 3.9 mmol/L (ref 3.5–5.1)
SODIUM: 139 mmol/L (ref 135–145)

## 2015-08-26 LAB — BRAIN NATRIURETIC PEPTIDE: B Natriuretic Peptide: 570 pg/mL — ABNORMAL HIGH (ref 0.0–100.0)

## 2015-08-26 LAB — CBC
HCT: 34.9 % — ABNORMAL LOW (ref 40.0–52.0)
HEMOGLOBIN: 11.7 g/dL — AB (ref 13.0–18.0)
MCH: 29.9 pg (ref 26.0–34.0)
MCHC: 33.4 g/dL (ref 32.0–36.0)
MCV: 89.5 fL (ref 80.0–100.0)
Platelets: 222 10*3/uL (ref 150–440)
RBC: 3.9 MIL/uL — AB (ref 4.40–5.90)
RDW: 14.4 % (ref 11.5–14.5)
WBC: 9.3 10*3/uL (ref 3.8–10.6)

## 2015-08-26 LAB — TROPONIN I: TROPONIN I: 0.07 ng/mL — AB (ref <0.031)

## 2015-08-26 MED ORDER — FUROSEMIDE 10 MG/ML IJ SOLN
40.0000 mg | Freq: Once | INTRAMUSCULAR | Status: AC
  Administered 2015-08-26: 40 mg via INTRAVENOUS
  Filled 2015-08-26: qty 4

## 2015-08-26 NOTE — ED Provider Notes (Signed)
[facility] Emergency Department Provider Note  ____________________________________________  Time seen: 10:00 PM  I have reviewed the triage vital signs and the nursing notes.   HISTORY  Chief Complaint Shortness of Breath    HPI [name] is a 82 y.o. male who complains of shortness of breath for the past 3 days. Gradual onset, constant. No chest pain. No cough fever chills runny nose or sore throat. No vomiting or diarrhea or abdominal pain. Does not use oxygen at home. Gets more short of breath when lying supine or walking around.     Past Medical History  Diagnosis Date  . Coronary artery disease   . CHF (congestive heart failure) (HCC)     ischemic cardiomyopathy- EF 25%  . Hyperlipidemia   . Diabetes mellitus without complication (HCC)   . Atrial fibrillation (HCC)   . BPH (benign prostatic hyperplasia)   . Peripheral vascular disease (HCC)   . Chronic kidney disease   . Insomnia   . Renal insufficiency   . Asthma      Patient Active Problem List   Diagnosis Date Noted  . Acute on chronic systolic CHF (congestive heart failure) (HCC) 08/26/2015  . HCAP (healthcare-associated pneumonia) 06/20/2015  . Sepsis (HCC) 03/14/2015  . Diabetes mellitus type 2 in obese (HCC) 10/01/2014  . Chronic atrial fibrillation (HCC) 10/01/2014  . Chronic kidney disease, stage III (moderate) 10/01/2014  . Chronic systolic (congestive) heart failure (HCC) 10/01/2014  . Peripheral vascular disease (HCC) 10/01/2014  . Coronary artery disease 10/01/2014     Past Surgical History  Procedure Laterality Date  . Cardiac surgery    . Coronary artery bypass graft       Current Outpatient Rx  Name  Route  Sig  Dispense  Refill  . albuterol (PROVENTIL HFA;VENTOLIN HFA) 108 (90 BASE) MCG/ACT inhaler   Inhalation   Inhale 2 puffs into the lungs every 6 (six) hours as needed for wheezing or shortness of breath.   1 Inhaler   2   . aspirin 325 MG  tablet   Oral   Take 325 mg by mouth daily.          Marland Kitchen dextromethorphan-guaiFENesin (MUCINEX DM) 30-600 MG 12hr tablet   Oral   Take 1 tablet by mouth every 12 (twelve) hours.         Marland Kitchen dextrose (GLUTOSE) 40 % GEL   Oral   Take 1 Tube by mouth as needed for low blood sugar.         . docusate sodium (COLACE) 100 MG capsule   Oral   Take 100 mg by mouth 2 (two) times daily.         . furosemide (LASIX) 40 MG tablet   Oral   Take 1 tablet (40 mg total) by mouth daily. Patient taking differently: Take 40 mg by mouth daily. Take 1 tablet daily Monday-Friday and 1 tablet twice daily on Saturday and Sunday.   30 tablet   0   . gabapentin (NEURONTIN) 300 MG capsule   Oral   Take 300-600 mg by mouth 3 (three) times daily. Take 1 capsule at 0900 & 1400 and 2 capsules at 2000.         Marland Kitchen guaiFENesin-dextromethorphan (ROBITUSSIN DM) 100-10 MG/5ML syrup   Oral   Take 15 mLs by mouth every 4 (four) hours as needed for cough.         . insulin aspart (NOVOLOG) 100 UNIT/ML injection   Subcutaneous  Inject 3 Units into the skin 3 (three) times daily with meals.   10 mL   11   . insulin glargine (LANTUS) 100 UNIT/ML injection   Subcutaneous   Inject 0.32 mLs (32 Units total) into the skin daily. Patient taking differently: Inject 34 Units into the skin daily.    10 mL   11   . ipratropium-albuterol (DUONEB) 0.5-2.5 (3) MG/3ML SOLN   Nebulization   Take 3 mLs by nebulization every 6 (six) hours as needed.   360 mL   0   . levofloxacin (LEVAQUIN) 500 MG tablet   Oral   Take 1 tablet (500 mg total) by mouth daily.   4 tablet   0   . nitroGLYCERIN (NITROSTAT) 0.4 MG SL tablet   Sublingual   Place 1 tablet (0.4 mg total) under the tongue every 5 (five) minutes as needed for chest pain.   30 tablet   1   . oxyCODONE-acetaminophen (PERCOCET/ROXICET) 5-325 MG tablet   Oral   Take 0.5 tablets by mouth at bedtime. Pt also takes one-half tablet every six hours as  needed for moderate/severe pain.   30 tablet   0   . polyethylene glycol (MIRALAX / GLYCOLAX) packet   Oral   Take 17 g by mouth every Monday, Wednesday, and Friday.          . pravastatin (PRAVACHOL) 20 MG tablet   Oral   Take 20 mg by mouth at bedtime.          . predniSONE (DELTASONE) 50 MG tablet   Oral   Take 1 tablet (50 mg total) by mouth daily with breakfast.   5 tablet   0   . rivaroxaban (XARELTO) 20 MG TABS tablet   Oral   Take 20 mg by mouth daily.         Marland Kitchen terazosin (HYTRIN) 2 MG capsule   Oral   Take 2 mg by mouth at bedtime.          . triazolam (HALCION) 0.25 MG tablet   Oral   Take 0.5 mg by mouth at bedtime.          . vitamin B-12 (CYANOCOBALAMIN) 500 MCG tablet   Oral   Take 500 mcg by mouth daily.            Allergies Review of patient's allergies indicates no known allergies.   Family History  Problem Relation Age of Onset  . Diabetes Other     Social History Social History  Substance Use Topics  . Smoking status: Never Smoker   . Smokeless tobacco: Never Used  . Alcohol Use: No    Review of Systems  Constitutional:   No fever or chills.  Eyes:   No vision changes.  ENT:   No sore throat. No rhinorrhea. Cardiovascular:   No chest pain. Respiratory:   Positive dyspnea without cough. Gastrointestinal:   Negative for abdominal pain, vomiting and diarrhea.  No bloody stool. Genitourinary:   Negative for dysuria or difficulty urinating. Musculoskeletal:   Positive leg swelling bilaterally Neurological:   Negative for headaches 10-point ROS otherwise negative.  ____________________________________________   PHYSICAL EXAM:  VITAL SIGNS: ED Triage Vitals  Enc Vitals Group     BP 08/26/15 2156 134/89 mmHg     Pulse Rate 08/26/15 2156 85     Resp 08/26/15 2156 17     Temp 08/26/15 2156 97.8 F (36.6 C)     Temp Source 08/26/15 2156 Oral  SpO2 08/26/15 2156 97 %     Weight 08/26/15 2156 243 lb (110.224 kg)      Height 08/26/15 2156 6' (1.829 m)     Head Cir --      Peak Flow --      Pain Score --      Pain Loc --      Pain Edu? --      Excl. in GC? --     Vital signs reviewed, nursing assessments reviewed.   Constitutional:   Alert and oriented. Mild respiratory distress. Eyes:   No scleral icterus. No conjunctival pallor. PERRL. EOMI ENT   Head:   Normocephalic and atraumatic.   Nose:   No congestion/rhinnorhea. No septal hematoma   Mouth/Throat:   MMM, no pharyngeal erythema. No peritonsillar mass.    Neck:   No stridor. No SubQ emphysema. No meningismus. Increased jugular venous pressure Hematological/Lymphatic/Immunilogical:   No cervical lymphadenopathy. Cardiovascular:   RRR. Symmetric bilateral radial and DP pulses.  No murmurs.  Respiratory:   Diffuse mild inspiratory wheezing with bibasilar crackles. Tachypnea. Gastrointestinal:   Soft and nontender. Non distended. There is no CVA tenderness.  No rebound, rigidity, or guarding. Genitourinary:   deferred Musculoskeletal:   Nontender with normal range of motion in all extremities. No joint effusions.  No lower extremity tenderness.  1+ pitting edema bilaterally, symmetric. Neurologic:   Normal speech and language.  CN 2-10 normal. Impaired hearing Motor grossly intact. No gross focal neurologic deficits are appreciated.  Skin:    Skin is warm, dry and intact. No rash noted.  No petechiae, purpura, or bullae.  ____________________________________________    LABS (pertinent positives/negatives) (all labs ordered are listed, but only abnormal results are displayed) Labs Reviewed  BASIC METABOLIC PANEL - Abnormal; Notable for the following:    Glucose, Bld 289 (*)    BUN 28 (*)    Creatinine, Ser 1.39 (*)    GFR calc non Af Amer 43 (*)    GFR calc Af Amer 49 (*)    All other components within normal limits  CBC - Abnormal; Notable for the following:    RBC 3.90 (*)    Hemoglobin 11.7 (*)    HCT 34.9 (*)     All other components within normal limits  TROPONIN I - Abnormal; Notable for the following:    Troponin I 0.07 (*)    All other components within normal limits  BRAIN NATRIURETIC PEPTIDE   ____________________________________________   EKG  Interpreted by me Atrial fibrillation rate of 87, normal axis, normal intervals. Left bundle branch block. No acute ischemic changes by Sgarbossa criteria.  ____________________________________________    RADIOLOGY  Chest x-ray with pulmonary vascular congestion and pleural effusion with cardiomegaly.  ____________________________________________   PROCEDURES   ____________________________________________   INITIAL IMPRESSION / ASSESSMENT AND PLAN / ED COURSE  Pertinent labs & imaging results that were available during my care of the patient were reviewed by me and considered in my medical decision making (see chart for details).  Patient ill-appearing with increased work of breathing. Signs and symptoms consistent with CHF exacerbation. The patient IV Lasix. He has an elevated troponin as well, likely due to heart strain. Low suspicion for ACS. Case discussed with the hospitalist for admission.     ____________________________________________   FINAL CLINICAL IMPRESSION(S) / ED DIAGNOSES  Final diagnoses:  Acute on chronic systolic CHF (congestive heart failure) (HCC)  Dyspnea  Peripheral edema       Portions of  this note were generated with dragon dictation software. Dictation errors may occur despite best attempts at proofreading.   Sharman Cheek, MD 08/26/15 [phone]

## 2015-08-26 NOTE — ED Notes (Signed)
Critical troponin of 0.07 called from lab. Dr. Scotty CourtStafford notified.

## 2015-08-26 NOTE — H&P (Signed)
[facility] [place] at [facility]   PATIENT NAME: Martin [name]    MR#:  [id number]  DATE OF BIRTH:  December 03, 1921  DATE OF ADMISSION:  08/26/2015  PRIMARY CARE PHYSICIAN: Pcp Not In System   REQUESTING/REFERRING PHYSICIAN: Scotty Court, MD  CHIEF COMPLAINT:   Chief Complaint  Patient presents with  . Shortness of Breath    HISTORY OF PRESENT ILLNESS:  Martin [name]  is a 82 y.o. male who presents with Progressive shortness of breath and lower extremity and abdominal swelling over the past 2 days. Patient is also had some orthopnea during this time. He was recently here in the hospital for pneumonia. On evaluation here in the ED chest x-ray shows pulmonary edema consistent with exacerbation of heart failure. He has a known history of heart failure. BNP is elevated at 570. He was given some Lasix in the ED, and hospitalists were called for admission.  PAST MEDICAL HISTORY:   Past Medical History  Diagnosis Date  . Coronary artery disease   . CHF (congestive heart failure) (HCC)     ischemic cardiomyopathy- EF 25%  . Hyperlipidemia   . Diabetes mellitus without complication (HCC)   . Atrial fibrillation (HCC)   . BPH (benign prostatic hyperplasia)   . Peripheral vascular disease (HCC)   . Chronic kidney disease   . Insomnia   . Renal insufficiency   . Asthma     PAST SURGICAL HISTORY:   Past Surgical History  Procedure Laterality Date  . Cardiac surgery    . Coronary artery bypass graft      SOCIAL HISTORY:   Social History  Substance Use Topics  . Smoking status: Never Smoker   . Smokeless tobacco: Never Used  . Alcohol Use: No    FAMILY HISTORY:   Family History  Problem Relation Age of Onset  . Diabetes Other     DRUG ALLERGIES:  No Known Allergies  MEDICATIONS AT HOME:   Prior to Admission medications   Medication Sig Start Date End Date Taking? Authorizing Provider  albuterol (PROVENTIL HFA;VENTOLIN HFA) 108 (90 BASE)  MCG/ACT inhaler Inhale 2 puffs into the lungs every 6 (six) hours as needed for wheezing or shortness of breath. 03/18/15   Ramonita Lab, MD  aspirin 325 MG tablet Take 325 mg by mouth daily.     Historical Provider, MD  dextromethorphan-guaiFENesin (MUCINEX DM) 30-600 MG 12hr tablet Take 1 tablet by mouth every 12 (twelve) hours.    Historical Provider, MD  dextrose (GLUTOSE) 40 % GEL Take 1 Tube by mouth as needed for low blood sugar.    Historical Provider, MD  docusate sodium (COLACE) 100 MG capsule Take 100 mg by mouth 2 (two) times daily.    Historical Provider, MD  gabapentin (NEURONTIN) 300 MG capsule Take 300-600 mg by mouth 3 (three) times daily. Take 1 capsule at 0900 & 1400 and 2 capsules at 2000.    Historical Provider, MD  guaiFENesin-dextromethorphan (ROBITUSSIN DM) 100-10 MG/5ML syrup Take 15 mLs by mouth every 4 (four) hours as needed for cough.    Historical Provider, MD  insulin aspart (NOVOLOG) 100 UNIT/ML injection Inject 3 Units into the skin 3 (three) times daily with meals. 03/18/15   Ramonita Lab, MD  insulin glargine (LANTUS) 100 UNIT/ML injection Inject 0.32 mLs (32 Units total) into the skin daily. Patient taking differently: Inject 34 Units into the skin daily.  03/18/15   Ramonita Lab, MD  nitroGLYCERIN (NITROSTAT) 0.4 MG SL tablet Place  1 tablet (0.4 mg total) under the tongue every 5 (five) minutes as needed for chest pain. 09/12/14   Houston Siren, MD  oxyCODONE-acetaminophen (PERCOCET/ROXICET) 5-325 MG tablet Take 0.5 tablets by mouth at bedtime. Pt also takes one-half tablet every six hours as needed for moderate/severe pain. 03/18/15   Ramonita Lab, MD  polyethylene glycol (MIRALAX / GLYCOLAX) packet Take 17 g by mouth every Monday, Wednesday, and Friday.     Historical Provider, MD  pravastatin (PRAVACHOL) 20 MG tablet Take 20 mg by mouth at bedtime.     Historical Provider, MD  predniSONE (DELTASONE) 50 MG tablet Take 1 tablet (50 mg total) by mouth daily with  breakfast. 07/31/15   Milagros Loll, MD  rivaroxaban (XARELTO) 20 MG TABS tablet Take 20 mg by mouth daily.    Historical Provider, MD  terazosin (HYTRIN) 2 MG capsule Take 2 mg by mouth at bedtime.     Historical Provider, MD  triazolam (HALCION) 0.25 MG tablet Take 0.5 mg by mouth at bedtime.     Historical Provider, MD  vitamin B-12 (CYANOCOBALAMIN) 500 MCG tablet Take 500 mcg by mouth daily.    Historical Provider, MD    REVIEW OF SYSTEMS:  Review of Systems  Constitutional: Negative for fever, chills, weight loss and malaise/fatigue.  HENT: Negative for ear pain, hearing loss and tinnitus.   Eyes: Negative for blurred vision, double vision, pain and redness.  Respiratory: Positive for shortness of breath and wheezing. Negative for cough and hemoptysis.   Cardiovascular: Positive for orthopnea and leg swelling. Negative for chest pain and palpitations.  Gastrointestinal: Negative for nausea, vomiting, abdominal pain, diarrhea and constipation.  Genitourinary: Negative for dysuria, frequency and hematuria.  Musculoskeletal: Negative for back pain, joint pain and neck pain.  Skin:       No acne, rash, or lesions  Neurological: Negative for dizziness, tremors, focal weakness and weakness.  Endo/Heme/Allergies: Negative for polydipsia. Does not bruise/bleed easily.  Psychiatric/Behavioral: Negative for depression. The patient is not nervous/anxious and does not have insomnia.      VITAL SIGNS:   Filed Vitals:   08/26/15 2156 08/26/15 2200 08/26/15 2230 08/26/15 2300  BP: 134/89 148/76 124/83 136/65  Pulse: 85 81 89 56  Temp: 97.8 F (36.6 C)     TempSrc: Oral     Resp: Height: 6' (1.829 m)     Weight: 110.224 kg (243 lb)     SpO2: 97% 98% 93% 91%   Wt Readings from Last 3 Encounters:  08/26/15 110.224 kg (243 lb)  07/28/15 113.626 kg (250 lb 8 oz)  06/23/15 108.682 kg (239 lb 9.6 oz)    PHYSICAL EXAMINATION:  Physical Exam  Vitals reviewed. Constitutional:  He is oriented to person, place, and time. He appears well-developed and well-nourished. No distress.  HENT:  Head: Normocephalic and atraumatic.  Mouth/Throat: Oropharynx is clear and moist.  Eyes: Conjunctivae and EOM are normal. Pupils are equal, round, and reactive to light. No scleral icterus.  Neck: Normal range of motion. Neck supple. No JVD present. No thyromegaly present.  Cardiovascular: Normal rate, regular rhythm and intact distal pulses.  Exam reveals no gallop and no friction rub.   Murmur (3/6 systolic murmur) heard. Respiratory: He is in respiratory distress (mild). He has wheezes. He has rales.  GI: Soft. Bowel sounds are normal. He exhibits distension. There is no tenderness.  Musculoskeletal: Normal range of motion. He exhibits edema.  No arthritis, no gout  Lymphadenopathy:  He has no cervical adenopathy.  Neurological: He is alert and oriented to person, place, and time. No cranial nerve deficit.  No dysarthria, no aphasia  Skin: Skin is warm and dry. No rash noted. No erythema.  Psychiatric: He has a normal mood and affect. His behavior is normal. Judgment and thought content normal.    LABORATORY PANEL:   CBC  Recent Labs Lab 08/26/15 2157  WBC 9.3  HGB 11.7*  HCT 34.9*  PLT 222   ------------------------------------------------------------------------------------------------------------------  Chemistries   Recent Labs Lab 08/26/15 2157  NA 139  K 3.9  CL 107  CO2 24  GLUCOSE 289*  BUN 28*  CREATININE 1.39*  CALCIUM 9.2   ------------------------------------------------------------------------------------------------------------------  Cardiac Enzymes  Recent Labs Lab 08/26/15 2157  TROPONINI 0.07*   ------------------------------------------------------------------------------------------------------------------  RADIOLOGY:  Dg Chest 2 View  08/26/2015  CLINICAL DATA:  Shortness of breath, wheezing EXAM: CHEST  2 VIEW COMPARISON:   07/27/2015 FINDINGS: Cardiomegaly with pulmonary vascular congestion. No frank interstitial edema. Suspected small bilateral pleural effusions.  No pneumothorax. Postsurgical changes related to prior CABG.  Median sternotomy. Degenerative changes of the visualized thoracolumbar spine. IMPRESSION: Cardiomegaly with pulmonary vascular congestion. No frank interstitial edema. Small bilateral pleural effusions. Electronically Signed   By: Charline BillsSriyesh  Krishnan M.D.   On: 08/26/2015 22:28    EKG:   Orders placed or performed during the hospital encounter of 08/26/15  . ED EKG within 10 minutes  . ED EKG within 10 minutes  . EKG 12-Lead  . EKG 12-Lead    IMPRESSION AND PLAN:  Principal Problem:   Acute on chronic systolic CHF (congestive heart failure) (HCC) - O2 sats are stable on 2 L oxygen via nasal cannula. Given a dose of IV Lasix in the ED, we'll continue this on admission. We will cycle his enzymes tonight as his initial enzyme and the ED was mildly elevated at 0.07, though this is likely demand ischemia. We will also order an echocardiogram and a cardiology consult. Active Problems:   Diabetes mellitus type 2 in obese (HCC) - continue Lantus at reduced home dose, with sliding scale insulin and corresponding glucose checks before meals and at bedtime, carb modified diet   Chronic atrial fibrillation (HCC) - continue home rate controlling medications and anticoagulation   Chronic kidney disease, stage III (moderate) - stable at baseline, avoid nephrotoxins and monitor   Coronary artery disease - continue home meds   Peripheral vascular disease (HCC) - home meds and anticoagulation as above  All the records are reviewed and case discussed with ED provider. Management plans discussed with the patient and/or family.  DVT PROPHYLAXIS: Systemic anticoagulation  GI PROPHYLAXIS: None  ADMISSION STATUS: Inpatient  CODE STATUS: Full Code Status History    Date Active Date Inactive Code Status  Order ID Comments User Context   07/28/2015  2:58 AM 07/31/2015  8:08 PM Full Code [id number]  Oralia Manisavid Chelesa Weingartner, MD Inpatient   06/20/2015 12:04 PM 06/23/2015  5:10 PM Full Code [id number]  Milagros LollSrikar Sudini, MD ED   03/14/2015 10:19 PM 03/18/2015  5:46 PM Full Code [id number]  Wyatt Hasteavid K Hower, MD ED   10/02/2014  3:05 AM 10/02/2014  5:53 PM Full Code [id number]  Gale Journeyatherine P Walsh, MD Inpatient   09/10/2014 12:05 PM 09/12/2014  9:31 PM Full Code [id number]  Altamese DillingVaibhavkumar Vachhani, MD Inpatient      TOTAL TIME TAKING CARE OF THIS PATIENT: 45 minutes.    Kaydyn Sayas FIELDING 08/26/2015, 11:09 PM  [facility]  Office  [phone]  CC: Primary care physician; Pcp Not In System

## 2015-08-26 NOTE — ED Notes (Signed)
Pt able to speak in clear full sentences, skin normal color warm and dry.

## 2015-08-26 NOTE — ED Notes (Signed)
Per EMS patient comes from [facility] with [facility]. Staff at [name] gave a duo neb tx with little relief. Per EMS patient was stating 94% on RA but was hyperventilating. Hx of diabetes, CBG over 300 per EMS, CHF, and PVD. Patient is very hard of hearing. Here patient stating 91% on RA, patient placed on 3L [place], SpO2 up to 97%.

## 2015-08-27 ENCOUNTER — Encounter: Payer: Self-pay | Admitting: Emergency Medicine

## 2015-08-27 ENCOUNTER — Inpatient Hospital Stay
Admit: 2015-08-27 | Discharge: 2015-08-27 | Disposition: A | Discharge status: Home / Self Care | Payer: Medicare Other | Attending: Internal Medicine | Admitting: Internal Medicine

## 2015-08-27 LAB — BASIC METABOLIC PANEL
ANION GAP: 8 (ref 5–15)
BUN: 27 mg/dL — ABNORMAL HIGH (ref 6–20)
CALCIUM: 9.2 mg/dL (ref 8.9–10.3)
CO2: 26 mmol/L (ref 22–32)
CREATININE: 1.47 mg/dL — AB (ref 0.61–1.24)
Chloride: 106 mmol/L (ref 101–111)
GFR, EST AFRICAN AMERICAN: 46 mL/min — AB (ref >60)
GFR, EST NON AFRICAN AMERICAN: 40 mL/min — AB (ref >60)
Glucose, Bld: 273 mg/dL — ABNORMAL HIGH (ref 65–99)
Potassium: 3.9 mmol/L (ref 3.5–5.1)
SODIUM: 140 mmol/L (ref 135–145)

## 2015-08-27 LAB — GLUCOSE, CAPILLARY
GLUCOSE-CAPILLARY: 280 mg/dL — AB (ref 65–99)
GLUCOSE-CAPILLARY: 143 mg/dL — AB (ref 65–99)
GLUCOSE-CAPILLARY: 217 mg/dL — AB (ref 65–99)
GLUCOSE-CAPILLARY: 397 mg/dL — AB (ref 65–99)
Glucose-Capillary: 353 mg/dL — ABNORMAL HIGH (ref 65–99)

## 2015-08-27 LAB — MRSA PCR SCREENING: MRSA BY PCR: NEGATIVE

## 2015-08-27 LAB — CBC
HCT: 34.8 % — ABNORMAL LOW (ref 40.0–52.0)
HEMOGLOBIN: 11.6 g/dL — AB (ref 13.0–18.0)
MCH: 29.7 pg (ref 26.0–34.0)
MCHC: 33.4 g/dL (ref 32.0–36.0)
MCV: 89.1 fL (ref 80.0–100.0)
PLATELETS: 236 10*3/uL (ref 150–440)
RBC: 3.9 MIL/uL — AB (ref 4.40–5.90)
RDW: 14.7 % — ABNORMAL HIGH (ref 11.5–14.5)
WBC: 9.3 10*3/uL (ref 3.8–10.6)

## 2015-08-27 LAB — TROPONIN I
TROPONIN I: 0.09 ng/mL — AB (ref <0.031)
TROPONIN I: 0.1 ng/mL — AB (ref <0.031)
Troponin I: 0.14 ng/mL — ABNORMAL HIGH (ref <0.031)

## 2015-08-27 LAB — ECHOCARDIOGRAM COMPLETE
Height: 72 in
Weight: 3905.6 oz

## 2015-08-27 LAB — HEMOGLOBIN A1C: HEMOGLOBIN A1C: 9.4 % — AB (ref 4.0–6.0)

## 2015-08-27 MED ORDER — ASPIRIN 325 MG PO TABS
325.0000 mg | ORAL_TABLET | Freq: Every day | ORAL | Status: DC
  Administered 2015-08-27 – 2015-08-29 (×3): 325 mg via ORAL
  Filled 2015-08-27 (×3): qty 1

## 2015-08-27 MED ORDER — INSULIN ASPART 100 UNIT/ML ~~LOC~~ SOLN
0.0000 [IU] | Freq: Three times a day (TID) | SUBCUTANEOUS | Status: DC
  Administered 2015-08-27: 3 [IU] via SUBCUTANEOUS
  Administered 2015-08-27: 9 [IU] via SUBCUTANEOUS
  Administered 2015-08-27: 1 [IU] via SUBCUTANEOUS
  Administered 2015-08-28: 14 [IU] via SUBCUTANEOUS
  Administered 2015-08-28: 7 [IU] via SUBCUTANEOUS
  Administered 2015-08-28: 5 [IU] via SUBCUTANEOUS
  Administered 2015-08-29: 2 [IU] via SUBCUTANEOUS
  Filled 2015-08-27: qty 5
  Filled 2015-08-27: qty 9
  Filled 2015-08-27: qty 1
  Filled 2015-08-27: qty 14
  Filled 2015-08-27: qty 3
  Filled 2015-08-27: qty 2
  Filled 2015-08-27: qty 7

## 2015-08-27 MED ORDER — IPRATROPIUM-ALBUTEROL 0.5-2.5 (3) MG/3ML IN SOLN
3.0000 mL | Freq: Four times a day (QID) | RESPIRATORY_TRACT | Status: DC | PRN
  Administered 2015-08-27 – 2015-08-28 (×2): 3 mL via RESPIRATORY_TRACT
  Filled 2015-08-27 (×2): qty 3

## 2015-08-27 MED ORDER — OXYCODONE-ACETAMINOPHEN 5-325 MG PO TABS
0.5000 | ORAL_TABLET | Freq: Four times a day (QID) | ORAL | Status: DC | PRN
  Administered 2015-08-27: 0.5 via ORAL
  Filled 2015-08-27: qty 1

## 2015-08-27 MED ORDER — BUDESONIDE 0.25 MG/2ML IN SUSP
0.2500 mg | Freq: Two times a day (BID) | RESPIRATORY_TRACT | Status: DC
  Administered 2015-08-27 – 2015-08-29 (×5): 0.25 mg via RESPIRATORY_TRACT
  Filled 2015-08-27 (×5): qty 2

## 2015-08-27 MED ORDER — METHYLPREDNISOLONE SODIUM SUCC 125 MG IJ SOLR
125.0000 mg | Freq: Once | INTRAMUSCULAR | Status: AC
  Administered 2015-08-27: 125 mg via INTRAVENOUS
  Filled 2015-08-27: qty 2

## 2015-08-27 MED ORDER — PRAVASTATIN SODIUM 20 MG PO TABS
20.0000 mg | ORAL_TABLET | Freq: Every day | ORAL | Status: DC
  Administered 2015-08-27 – 2015-08-28 (×2): 20 mg via ORAL
  Filled 2015-08-27 (×2): qty 1

## 2015-08-27 MED ORDER — RIVAROXABAN 20 MG PO TABS
20.0000 mg | ORAL_TABLET | Freq: Every day | ORAL | Status: DC
  Administered 2015-08-27 – 2015-08-29 (×3): 20 mg via ORAL
  Filled 2015-08-27 (×3): qty 1

## 2015-08-27 MED ORDER — METHYLPREDNISOLONE SODIUM SUCC 40 MG IJ SOLR
40.0000 mg | Freq: Four times a day (QID) | INTRAMUSCULAR | Status: DC
  Administered 2015-08-27 – 2015-08-28 (×4): 40 mg via INTRAVENOUS
  Filled 2015-08-27 (×4): qty 1

## 2015-08-27 MED ORDER — TRIAZOLAM 0.25 MG PO TABS
0.5000 mg | ORAL_TABLET | Freq: Every day | ORAL | Status: DC
  Administered 2015-08-27 – 2015-08-28 (×2): 0.5 mg via ORAL
  Filled 2015-08-27 (×2): qty 2

## 2015-08-27 MED ORDER — TERAZOSIN HCL 2 MG PO CAPS
2.0000 mg | ORAL_CAPSULE | Freq: Every day | ORAL | Status: DC
  Administered 2015-08-27 – 2015-08-28 (×2): 2 mg via ORAL
  Filled 2015-08-27 (×2): qty 1

## 2015-08-27 MED ORDER — IPRATROPIUM-ALBUTEROL 0.5-2.5 (3) MG/3ML IN SOLN
3.0000 mL | Freq: Four times a day (QID) | RESPIRATORY_TRACT | Status: DC
  Administered 2015-08-27 – 2015-08-29 (×7): 3 mL via RESPIRATORY_TRACT
  Filled 2015-08-27 (×6): qty 3

## 2015-08-27 MED ORDER — ACETAMINOPHEN 325 MG PO TABS: 650.0000 mg | ORAL_TABLET | Freq: Four times a day (QID) | ORAL | Status: DC | PRN

## 2015-08-27 MED ORDER — ONDANSETRON HCL 4 MG/2ML IJ SOLN: 4.0000 mg | Freq: Four times a day (QID) | INTRAMUSCULAR | Status: DC | PRN

## 2015-08-27 MED ORDER — ACETAMINOPHEN 650 MG RE SUPP: 650.0000 mg | Freq: Four times a day (QID) | RECTAL | Status: DC | PRN

## 2015-08-27 MED ORDER — INSULIN ASPART 100 UNIT/ML ~~LOC~~ SOLN
0.0000 [IU] | Freq: Every day | SUBCUTANEOUS | Status: DC
  Administered 2015-08-27: 5 [IU] via SUBCUTANEOUS
  Administered 2015-08-27: 3 [IU] via SUBCUTANEOUS
  Filled 2015-08-27: qty 3
  Filled 2015-08-27: qty 4

## 2015-08-27 MED ORDER — CETYLPYRIDINIUM CHLORIDE 0.05 % MT LIQD
7.0000 mL | Freq: Two times a day (BID) | OROMUCOSAL | Status: DC
  Administered 2015-08-27 – 2015-08-29 (×3): 7 mL via OROMUCOSAL

## 2015-08-27 MED ORDER — ONDANSETRON HCL 4 MG PO TABS: 4.0000 mg | ORAL_TABLET | Freq: Four times a day (QID) | ORAL | Status: DC | PRN

## 2015-08-27 MED ORDER — INSULIN GLARGINE 100 UNIT/ML SOLOSTAR PEN: 16.0000 [IU] | PEN_INJECTOR | Freq: Every day | SUBCUTANEOUS | Status: DC

## 2015-08-27 MED ORDER — FUROSEMIDE 10 MG/ML IJ SOLN
40.0000 mg | Freq: Four times a day (QID) | INTRAMUSCULAR | Status: AC
  Administered 2015-08-27 (×2): 40 mg via INTRAVENOUS
  Filled 2015-08-27 (×2): qty 4

## 2015-08-27 MED ORDER — INSULIN GLARGINE 100 UNIT/ML ~~LOC~~ SOLN
16.0000 [IU] | Freq: Every day | SUBCUTANEOUS | Status: DC
  Administered 2015-08-27 – 2015-08-28 (×2): 16 [IU] via SUBCUTANEOUS
  Filled 2015-08-27 (×2): qty 0.16

## 2015-08-27 MED ORDER — SODIUM CHLORIDE 0.9% FLUSH
3.0000 mL | Freq: Two times a day (BID) | INTRAVENOUS | Status: DC
  Administered 2015-08-27 – 2015-08-29 (×6): 3 mL via INTRAVENOUS

## 2015-08-27 NOTE — Care Management Important Message (Signed)
Important Message  Patient Details  Name: Martin [name] MRN: [id number] Date of Birth: 05-Dec-1921   Medicare Important Message Given:  Yes    Burrel Legrand A, RN 08/27/2015, 2:12 PM

## 2015-08-27 NOTE — ED Notes (Signed)
Pt transported to room 250 

## 2015-08-27 NOTE — Consult Note (Signed)
[facility]  CARDIOLOGY CONSULT NOTE  Patient ID: Martin [name] MRN: [id number] DOB/AGE: [birth year] 82 y.o.  Admit date: 08/26/2015 Referring Physician: Dr. Royetta AsalAmit Kalra Primary Cardiologist Dr. Gwen PoundsKowalski Reason for Consultation CHf  HPI: patient is a 82 year old male with history of coronary artery disease s/p cabg  as well as chronic severe ischemic systolic hear where her ejection pressure artery 25%, history ofdiabetes and hyperlipidemia who is now admitted with progressive shortness of breath. He was recently admitted with pneumonia. He was discharged to home but has had persistent shortness of breath and peripheral edema. He presented to the er where cxr revealed probable mild pulmonary edema. No obvious pneumonia.Marland Kitchen. He was placed on lasix and has some improvement with diuresis. He has no chest pain and has ruled out for an mi with serum tropoinin  Of 0.10. EKG reveals atrial fibrillation with lbbb. His family states that he is fairly sedentary due to knee pain and difficulty ambulating. He sits with his feet in the dependent position much of the time. He has chornic lower extremey edema, right greater than left.    Review of Systems  Constitutional: Positive for malaise/fatigue.  HENT: Positive for hearing loss.   Eyes: Negative.   Respiratory: Positive for cough, shortness of breath and wheezing.   Cardiovascular: Positive for leg swelling.  Gastrointestinal: Negative.   Genitourinary: Negative.   Musculoskeletal: Negative.   Skin: Negative.   Neurological: Positive for weakness.  Endo/Heme/Allergies: Negative.   Psychiatric/Behavioral: Negative.     Past Medical History  Diagnosis Date  . Coronary artery disease   . CHF (congestive heart failure) (HCC)     ischemic cardiomyopathy- EF 25%  . Hyperlipidemia   . Diabetes mellitus without complication (HCC)   . Atrial fibrillation (HCC)   . BPH (benign prostatic hyperplasia)   .  Peripheral vascular disease (HCC)   . Chronic kidney disease   . Insomnia   . Renal insufficiency   . Asthma     Family History  Problem Relation Age of Onset  . Diabetes Other     Social History   Social History  . Marital Status: Widowed    Spouse Name: N/A  . Number of Children: N/A  . Years of Education: N/A   Occupational History  . Not on file.   Social History Main Topics  . Smoking status: Never Smoker   . Smokeless tobacco: Never Used  . Alcohol Use: No  . Drug Use: No  . Sexual Activity: Not on file   Other Topics Concern  . Not on file   Social History Narrative    Past Surgical History  Procedure Laterality Date  . Cardiac surgery    . Coronary artery bypass graft       Prescriptions prior to admission  Medication Sig Dispense Refill Last Dose  . albuterol (PROVENTIL HFA;VENTOLIN HFA) 108 (90 BASE) MCG/ACT inhaler Inhale 2 puffs into the lungs every 6 (six) hours as needed for wheezing or shortness of breath. 1 Inhaler 2 PRN at PRN  . aspirin 325 MG tablet Take 325 mg by mouth daily.    08/26/2015 at Unknown time  . dextrose (GLUTOSE) 40 % GEL Take 1 Tube by mouth as needed for low blood sugar.   PRN at PRN  . docusate sodium (COLACE) 100 MG capsule Take 100 mg by mouth 2 (two) times daily.   08/26/2015 at Unknown time  . gabapentin (NEURONTIN) 300 MG capsule Take 300-600 mg by  mouth 3 (three) times daily. Take 1 capsule (300 mg) at 0900 & 1400 and 2 capsules (600 mg) at 2000.   08/26/2015 at Unknown time  . glucose blood test strip 1 each by Other route 3 (three) times daily before meals. Use as instructed   08/26/2015 at Unknown time  . guaiFENesin-dextromethorphan (ROBITUSSIN DM) 100-10 MG/5ML syrup Take 15 mLs by mouth every 4 (four) hours as needed for cough.   PRN at PRN  . insulin aspart (NOVOLOG FLEXPEN) 100 UNIT/ML FlexPen Inject 3 Units into the skin 3 (three) times daily with meals.   08/26/2015 at Unknown time  . Insulin Glargine (LANTUS SOLOSTAR)  100 UNIT/ML Solostar Pen Inject 32 Units into the skin daily.   08/26/2015 at Unknown time  . Insulin Pen Needle (NOVOFINE) 32G X 6 MM MISC 1 Device by Does not apply route 3 (three) times daily with meals. To use with Novolog FlexPen   08/26/2015 at Unknown time  . ipratropium-albuterol (DUONEB) 0.5-2.5 (3) MG/3ML SOLN Take 3 mLs by nebulization every 6 (six) hours as needed (shortness of breath).   PRN at PRN  . nitroGLYCERIN (NITROSTAT) 0.4 MG SL tablet Place 1 tablet (0.4 mg total) under the tongue every 5 (five) minutes as needed for chest pain. 30 tablet 1 PRN at PRN  . oxyCODONE-acetaminophen (PERCOCET/ROXICET) 5-325 MG tablet Take 0.5 tablets by mouth at bedtime. Pt also takes one-half tablet every six hours as needed for moderate/severe pain. 30 tablet 0 08/26/2015 at Unknown time  . polyethylene glycol (MIRALAX / GLYCOLAX) packet Take 17 g by mouth every Monday, Wednesday, and Friday.    08/25/2015 at Unknown time  . pravastatin (PRAVACHOL) 20 MG tablet Take 20 mg by mouth at bedtime.    08/26/2015 at Unknown time  . predniSONE (DELTASONE) 50 MG tablet Take 1 tablet (50 mg total) by mouth daily with breakfast. 5 tablet 0 Past Week at Unknown time  . rivaroxaban (XARELTO) 20 MG TABS tablet Take 20 mg by mouth daily.   08/26/2015 at Unknown time  . terazosin (HYTRIN) 2 MG capsule Take 2 mg by mouth at bedtime.    08/26/2015 at Unknown time  . triazolam (HALCION) 0.25 MG tablet Take 0.5 mg by mouth at bedtime.    08/26/2015 at Unknown time  . vitamin B-12 (CYANOCOBALAMIN) 500 MCG tablet Take 500 mcg by mouth daily.   08/26/2015 at Unknown time    Physical Exam: Blood pressure 169/98, pulse 95, temperature 98 F (36.7 C), temperature source Oral, resp. rate 22, height 6' (1.829 m), weight 110.723 kg (244 lb 1.6 oz), SpO2 100 %.   Wt Readings from Last 1 Encounters:  08/27/15 110.723 kg (244 lb 1.6 oz)     General appearance: alert and cooperative Neck: no adenopathy, no carotid bruit, no JVD,  supple, symmetrical, trachea midline and thyroid not enlarged, symmetric, no tenderness/mass/nodules Resp: rhonchi bilaterally and wheezes bilaterally Cardio: irregularly irregular rhythm GI: soft, non-tender; bowel sounds normal; no masses,  no organomegaly Extremities: edema 2-3+ edema  Neurologic: Grossly normal  Labs:   Lab Results  Component Value Date   WBC 9.3 08/27/2015   HGB 11.6* 08/27/2015   HCT 34.8* 08/27/2015   MCV 89.1 08/27/2015   PLT 236 08/27/2015    Recent Labs Lab 08/27/15 0142  NA 140  K 3.9  CL 106  CO2 26  BUN 27*  CREATININE 1.47*  CALCIUM 9.2  GLUCOSE 273*   Lab Results  Component Value Date   CKTOTAL 47  09/12/2013   CKMB 3.4 10/02/2014   TROPONINI 0.10* 08/27/2015      Radiology: mild pulmonary edema EKG: afib with variable vr  ASSESSMENT AND PLAN:  82 yo male with history of ischemic cardiomyopathy with ef of 25% admitted with progressive sob and peripheral edema. Was recently admitted with pneumonia. CXR shows mild pulmonary edema. He is wheezing a lot today. WIll treat with diuretics and brochodilators. Will add iv solumedrol. Mild trponin elevation which appears to be deman ischemi a. Not a candidated for invasive cardiac evaluaiton. Echo is pending but patient has known ef of 25%.  Signed: Dalia Heading MD, [facility] 08/27/2015, 2:38 PM

## 2015-08-27 NOTE — Progress Notes (Signed)
[employer] - [place] [facility]   PATIENT NAME: Martin RosenthalWalter [name]    MR#:  [id number]  DATE OF BIRTH:  [birth year]  SUBJECTIVE:   Patient here due to shortness of breath secondary to suspected CHF. This morning patient has significant wheezing and bronchospasm though. Positive cough but nonproductive.  REVIEW OF SYSTEMS:    Review of Systems  Constitutional: Negative for fever and chills.  HENT: Negative for congestion and tinnitus.   Eyes: Negative for blurred vision and double vision.  Respiratory: Positive for cough, shortness of breath and wheezing.   Cardiovascular: Negative for chest pain, orthopnea and PND.  Gastrointestinal: Negative for nausea, vomiting, abdominal pain and diarrhea.  Genitourinary: Negative for dysuria and hematuria.  Neurological: Negative for dizziness, sensory change and focal weakness.  All other systems reviewed and are negative.   Nutrition: Heart healthy Tolerating Diet: Yes Tolerating PT: Await evaluation     DRUG ALLERGIES:  No Known Allergies  VITALS:  Blood pressure 146/73, pulse 77, temperature 97.4 F (36.3 C), temperature source Oral, resp. rate 20, height 6' (1.829 m), weight 110.723 kg (244 lb 1.6 oz), SpO2 92 %.  PHYSICAL EXAMINATION:   Physical Exam  GENERAL:  82 y.o.-year-old Obese patient lying in the bed in mild resp. Distress with wheezing/bronchospasm,   EYES: Pupils equal, round, reactive to light and accommodation. No scleral icterus. Extraocular muscles intact.  HEENT: Head atraumatic, normocephalic. Oropharynx and nasopharynx clear. Very hard of hearing NECK:  Supple, no jugular venous distention. No thyroid enlargement, no tenderness.  LUNGS: Normal breath sounds bilaterally, diffuse wheezing b/l, bibasilar rales, No rhonchi. No use of accessory muscles of respiration.  CARDIOVASCULAR: S1, S2 normal. No murmurs, rubs, or gallops.  ABDOMEN: Soft, nontender, nondistended. Bowel sounds present. No  organomegaly or mass.  EXTREMITIES: No cyanosis, clubbing, + 2 pitting edema b/l.  NEUROLOGIC: Cranial nerves II through XII are intact. No focal Motor or sensory deficits b/l. Globally weak   PSYCHIATRIC: The patient is alert and oriented x 3.  SKIN: No obvious rash, lesion, or ulcer.    LABORATORY PANEL:   CBC  Recent Labs Lab 08/27/15 0142  WBC 9.3  HGB 11.6*  HCT 34.8*  PLT 236   ------------------------------------------------------------------------------------------------------------------  Chemistries   Recent Labs Lab 08/27/15 0142  NA 140  K 3.9  CL 106  CO2 26  GLUCOSE 273*  BUN 27*  CREATININE 1.47*  CALCIUM 9.2   ------------------------------------------------------------------------------------------------------------------  Cardiac Enzymes  Recent Labs Lab 08/27/15 0649  TROPONINI 0.10*   ------------------------------------------------------------------------------------------------------------------  RADIOLOGY:  Dg Chest 2 View  08/26/2015  CLINICAL DATA:  Shortness of breath, wheezing EXAM: CHEST  2 VIEW COMPARISON:  07/27/2015 FINDINGS: Cardiomegaly with pulmonary vascular congestion. No frank interstitial edema. Suspected small bilateral pleural effusions.  No pneumothorax. Postsurgical changes related to prior CABG.  Median sternotomy. Degenerative changes of the visualized thoracolumbar spine. IMPRESSION: Cardiomegaly with pulmonary vascular congestion. No frank interstitial edema. Small bilateral pleural effusions. Electronically Signed   By: Charline BillsSriyesh  Krishnan M.D.   On: 08/26/2015 22:28     ASSESSMENT AND PLAN:   82 year old male with past medical history of coronary artery disease, systolic CHF, diabetes, chronic afibrillation, hyperlipidemia, peripheral vascular disease, chronic kidney disease who presents to the hospital due to shortness of breath.  1. Acute respiratory failure with hypoxia-likely due to underlying CHF combined with  also COPD. -Patient has been diuresed with IV Lasix but continues to have significant wheezing bronchospasm. I will start the patient on some IV  steroids, DuoNeb nebs, Pulmicort nebs. Wean O2 as tolerated.  2. CHF-acute on chronic systolic dysfunction. -Patient has a cardiomyopathy ejection fraction of 25-30% -She has been diuresed with IV Lasix and will follow I's and O's and daily weights.  3. COPD-mild acute exacerbation as patient is having some wheezing bronchospasm. -Start patient on IV steroids, scheduled DuoNeb's, Pulmicort nebs. Follow clinically.  4. History of chronic atrial fibrillation-currently rate controlled. -Continue Xarelto.  5. BPH-continue Michelene Heady. No evidence of urinary retention.  6. Diabetes type 2 without complication-continue Lantus, sliding scale insulin. Follow blood sugars.  7. Hyperlipidemia-continue Pravachol.   All the records are reviewed and case discussed with Care Management/Social Workerr. Management plans discussed with the patient, family and they are in agreement.  CODE STATUS: Full  DVT Prophylaxis: Xarelto  TOTAL TIME TAKING CARE OF THIS PATIENT: 30 minutes.   POSSIBLE D/C IN 1-2 DAYS, DEPENDING ON CLINICAL CONDITION.   Houston Siren M.D on 08/27/2015 at 12:19 PM  Between 7am to 6pm - Pager - [phone]  After 6pm go to [email or web address] - password [email or web address]  [facility]  Office  [phone]  CC: Primary care physician; Pcp Not In System

## 2015-08-27 NOTE — Progress Notes (Signed)
Pt continues to have sever wheezing s/p breathing treatment- 02 sats stable/ no distress/  Dr. Lady GaryFath made aware/ orders given/ will continue to monitor.

## 2015-08-28 LAB — GLUCOSE, CAPILLARY
GLUCOSE-CAPILLARY: 297 mg/dL — AB (ref 65–99)
GLUCOSE-CAPILLARY: 139 mg/dL — AB (ref 65–99)
Glucose-Capillary: 436 mg/dL — ABNORMAL HIGH (ref 65–99)
Glucose-Capillary: 317 mg/dL — ABNORMAL HIGH (ref 65–99)

## 2015-08-28 LAB — BASIC METABOLIC PANEL
Anion gap: 8 (ref 5–15)
BUN: 35 mg/dL — ABNORMAL HIGH (ref 6–20)
CHLORIDE: 104 mmol/L (ref 101–111)
CO2: 28 mmol/L (ref 22–32)
CREATININE: 1.52 mg/dL — AB (ref 0.61–1.24)
Calcium: 9 mg/dL (ref 8.9–10.3)
GFR calc Af Amer: 44 mL/min — ABNORMAL LOW (ref >60)
GFR calc non Af Amer: 38 mL/min — ABNORMAL LOW (ref >60)
Glucose, Bld: 310 mg/dL — ABNORMAL HIGH (ref 65–99)
POTASSIUM: 4.6 mmol/L (ref 3.5–5.1)
Sodium: 140 mmol/L (ref 135–145)

## 2015-08-28 MED ORDER — CARVEDILOL 6.25 MG PO TABS
6.2500 mg | ORAL_TABLET | Freq: Two times a day (BID) | ORAL | Status: DC
  Administered 2015-08-28 – 2015-08-29 (×3): 6.25 mg via ORAL
  Filled 2015-08-28 (×3): qty 1

## 2015-08-28 MED ORDER — INSULIN ASPART 100 UNIT/ML ~~LOC~~ SOLN
3.0000 [IU] | Freq: Three times a day (TID) | SUBCUTANEOUS | Status: DC
  Administered 2015-08-28 – 2015-08-29 (×2): 3 [IU] via SUBCUTANEOUS
  Filled 2015-08-28 (×2): qty 3

## 2015-08-28 MED ORDER — INSULIN GLARGINE 100 UNIT/ML ~~LOC~~ SOLN
25.0000 [IU] | Freq: Every day | SUBCUTANEOUS | Status: DC
  Administered 2015-08-29: 25 [IU] via SUBCUTANEOUS
  Filled 2015-08-28: qty 0.25

## 2015-08-28 MED ORDER — HYDRALAZINE HCL 20 MG/ML IJ SOLN: 10.0000 mg | Freq: Four times a day (QID) | INTRAMUSCULAR | Status: DC | PRN

## 2015-08-28 MED ORDER — FUROSEMIDE 10 MG/ML IJ SOLN
20.0000 mg | Freq: Two times a day (BID) | INTRAMUSCULAR | Status: DC
  Administered 2015-08-28 (×2): 20 mg via INTRAVENOUS
  Filled 2015-08-28 (×3): qty 2

## 2015-08-28 MED ORDER — METHYLPREDNISOLONE SODIUM SUCC 40 MG IJ SOLR
40.0000 mg | Freq: Two times a day (BID) | INTRAMUSCULAR | Status: DC
  Administered 2015-08-28 – 2015-08-29 (×2): 40 mg via INTRAVENOUS
  Filled 2015-08-28 (×3): qty 1

## 2015-08-28 NOTE — Progress Notes (Addendum)
Dr. Ian BushmanSainiani paged to make aware of fsbs >400.Marland Kitchen./ orders to give 14 units of sliding scale  Insuline/ also made aware of high bp

## 2015-08-28 NOTE — Clinical Social Work Note (Signed)
Clinical Social Work Assessment  Patient Details  Name: Martin [name] MRN: [id number] Date of Birth: [birth year]  Date of referral:  08/28/15               Reason for consult:  Facility Placement                Permission sought to share information with:  Facility Sport and exercise psychologist, Family Supports Permission granted to share information::  Yes, Verbal Permission Granted  Name::        Agency::     Relationship::     Contact Information:     Housing/Transportation Living arrangements for the past 2 months:  [place] of Information:  Patient Patient Interpreter Needed:  None Criminal Activity/Legal Involvement Pertinent to Current Situation/Hospitalization:  No - Comment as needed Significant Relationships:  Adult Children Lives with:  Facility Resident Do you feel safe going back to the place where you live?  Yes Need for family participation in patient care:  No (Coment)  Care giving concerns:  Patient states he has resided at [facility] for 2 years.  Social Worker assessment / plan:  CSW met with patient and he is very hard of hearing. CSW was able to get from patient that he lives at [facility] and that the physician is thinking he might return there tomorrow.   Employment status:  Retired Nurse, adult PT Recommendations:  Not assessed at this time Information / Referral to community resources:     Patient/Family's Response to care:  Patient expressed appreciation for CSW assistance.  Patient/Family's Understanding of and Emotional Response to Diagnosis, Current Treatment, and Prognosis:  Patient is aware of his potential discharge tomorrow and he is wanting to return when time.  Emotional Assessment Appearance:  Appears stated age Attitude/Demeanor/Rapport:   (pleasant and cooperative/very hard of hearing) Affect (typically observed):  Accepting, Calm Orientation:  Oriented to Self, Oriented to  Situation Alcohol / Substance use:  Not Applicable Psych involvement (Current and /or in the community):  No (Comment)  Discharge Needs  Concerns to be addressed:  Care Coordination Readmission within the last 30 days:  No Current discharge risk:  None Barriers to Discharge:  No Barriers Identified   Shela Leff, LCSW 08/28/2015, 3:06 PM

## 2015-08-28 NOTE — Progress Notes (Signed)
Informed Dr. Allena KatzPatel that family is requesting ted place on patient. New order for ted.

## 2015-08-28 NOTE — Progress Notes (Signed)
Inpatient Diabetes Program Recommendations  AACE/ADA: New Consensus Statement on Inpatient Glycemic Control (2015)  Target Ranges:  Prepandial:   less than 140 mg/dL      Peak postprandial:   less than 180 mg/dL (1-2 hours)      Critically ill patients:  140 - 180 mg/dL   Results for Martin [name], Martin [name] (MRN [id number]) as of 08/28/2015 10:41  Ref. Range 08/27/2015 07:48 08/27/2015 12:00 08/27/2015 16:47 08/27/2015 21:11  Glucose-Capillary Latest Ref Range: 65-99 mg/dL 409143 (H) 811217 (H) 914353 (H) 397 (H)   Results for Martin [name], Martin [name] (MRN [id number]) as of 08/28/2015 10:41  Ref. Range 08/28/2015 07:33  Glucose-Capillary Latest Ref Range: 65-99 mg/dL 086297 (H)    Admit with: SOB  History: DM, CHF, COPD, CKD  Home DM Meds: Lantus 32 units daily       Novolog 3 units tidwc  Current Insulin Orders: Lantus 16 units daily      Novolog Sensitive Correction Scale/ SSI (0-9 units) TID AC + HS     -Note patient received 125 mg IV Solumedrol X 1 dose yesterday at 10am.  -Now getting IV Solumedrol 40 mg bid.  -AM CBG today: 297 mg/dl.    MD- Please consider the following in-hospital insulin adjustments:  1. Increase Lantus to 25 units daily (80% of home dose)  2. Start Novolog Meal Coverage- Novolog 3 units tidwc (home dose)      --Will follow patient during hospitalization--  Ambrose FinlandJeannine Johnston Conni Knighton RN, MSN, CDE Diabetes Coordinator Inpatient Glycemic Control Team Team Pager: [phone] (8a-5p)

## 2015-08-28 NOTE — Progress Notes (Signed)
[employer] - [place] at [facility]   PATIENT NAME: Martin [name]    MR#:  [id number]  DATE OF BIRTH:  1921-10-31  SUBJECTIVE:   Patient here due to shortness of breath secondary to suspected CHF. Patient's wheezing and bronchospasm significantly improved since yesterday. Feels a bit better. Blood sugars are uncontrolled.  REVIEW OF SYSTEMS:    Review of Systems  Constitutional: Negative for fever and chills.  HENT: Negative for congestion and tinnitus.   Eyes: Negative for blurred vision and double vision.  Respiratory: Positive for cough, shortness of breath and wheezing.   Cardiovascular: Negative for chest pain, orthopnea and PND.  Gastrointestinal: Negative for nausea, vomiting, abdominal pain and diarrhea.  Genitourinary: Negative for dysuria and hematuria.  Neurological: Negative for dizziness, sensory change and focal weakness.  All other systems reviewed and are negative.   Nutrition: Heart healthy Tolerating Diet: Yes Tolerating PT: Await evaluation     DRUG ALLERGIES:  No Known Allergies  VITALS:  Blood pressure 148/72, pulse 84, temperature 97.7 F (36.5 C), temperature source Oral, resp. rate 18, height 6' (1.829 m), weight 117.346 kg (258 lb 11.2 oz), SpO2 90 %.  PHYSICAL EXAMINATION:   Physical Exam  GENERAL:  82 y.o.-year-old Obese patient lying in the bed in NAD.   EYES: Pupils equal, round, reactive to light and accommodation. No scleral icterus. Extraocular muscles intact.  HEENT: Head atraumatic, normocephalic. Oropharynx and nasopharynx clear. Very hard of hearing NECK:  Supple, no jugular venous distention. No thyroid enlargement, no tenderness.  LUNGS: Normal breath sounds bilaterally, end-exp. Wheezing b/l, bibasilar rales, No rhonchi. No use of accessory muscles of respiration.  CARDIOVASCULAR: S1, S2 normal. No murmurs, rubs, or gallops.  ABDOMEN: Soft, nontender, nondistended. Bowel sounds present. No organomegaly or mass.   EXTREMITIES: No cyanosis, clubbing, + 2 pitting edema b/l.  NEUROLOGIC: Cranial nerves II through XII are intact. No focal Motor or sensory deficits b/l. Globally weak   PSYCHIATRIC: The patient is alert and oriented x 3.  SKIN: No obvious rash, lesion, or ulcer.    LABORATORY PANEL:   CBC  Recent Labs Lab 08/27/15 0142  WBC 9.3  HGB 11.6*  HCT 34.8*  PLT 236   ------------------------------------------------------------------------------------------------------------------  Chemistries   Recent Labs Lab 08/28/15 0438  NA 140  K 4.6  CL 104  CO2 28  GLUCOSE 310*  BUN 35*  CREATININE 1.52*  CALCIUM 9.0   ------------------------------------------------------------------------------------------------------------------  Cardiac Enzymes  Recent Labs Lab 08/27/15 1312  TROPONINI 0.14*   ------------------------------------------------------------------------------------------------------------------  RADIOLOGY:  Dg Chest 2 View  08/26/2015  CLINICAL DATA:  Shortness of breath, wheezing EXAM: CHEST  2 VIEW COMPARISON:  07/27/2015 FINDINGS: Cardiomegaly with pulmonary vascular congestion. No frank interstitial edema. Suspected small bilateral pleural effusions.  No pneumothorax. Postsurgical changes related to prior CABG.  Median sternotomy. Degenerative changes of the visualized thoracolumbar spine. IMPRESSION: Cardiomegaly with pulmonary vascular congestion. No frank interstitial edema. Small bilateral pleural effusions. Electronically Signed   By: Charline Bills M.D.   On: 08/26/2015 22:28     ASSESSMENT AND PLAN:   82 year old male with past medical history of coronary artery disease, systolic CHF, diabetes, chronic afibrillation, hyperlipidemia, peripheral vascular disease, chronic kidney disease who presents to the hospital due to shortness of breath.  1. Acute respiratory failure with hypoxia-likely due to underlying CHF combined with also COPD. -Improved.  Continue low-dose IV Lasix, IV steroids but will taper them. Continue duo nebs, Pulmicort nebs. -Wean O2 as tolerated.  2. CHF-acute  on chronic systolic dysfunction. -Patient has a cardiomyopathy ejection fraction of 25-30% -Continue low-dose IV Lasix, follow I's and O's and daily weights. Patient is about 4 L negative since admission.  3. COPD-mild acute exacerbation.  Wheezing bronchospasm much improved. -Continue IV steroids but will taper, continue scheduled DuoNeb's, Pulmicort nebs. Improving  4. History of chronic atrial fibrillation-currently rate controlled. -Continue Xarelto.  5. BPH-continue Terazosin. No evidence of urinary retention.  6. Diabetes type 2 without complication-blood sugars uncontrolled. Hemoglobin A1c as high as 9. -Appreciate diabetes coordinator input we'll advance Lantus, continue NovoLog with meals and sliding scale insulin. Follow blood sugars.  7. Hyperlipidemia-continue Pravachol.  Await physical therapy evaluation  All the records are reviewed and case discussed with Care Management/Social Workerr. Management plans discussed with the patient, family and they are in agreement.  CODE STATUS: Full  DVT Prophylaxis: Xarelto  TOTAL TIME TAKING CARE OF THIS PATIENT: 30 minutes.   POSSIBLE D/C IN 1-2 DAYS, DEPENDING ON CLINICAL CONDITION.   Houston SirenSAINANI,Lecretia Buczek J M.D on 08/28/2015 at 2:56 PM  Between 7am to 6pm - Pager - [phone]  After 6pm go to [email or web address] - password [email or web address]  Fabio Neighbors[facility] [place] [facility]  Office  [phone]  CC: Primary care physician; Pcp Not In System

## 2015-08-29 LAB — BASIC METABOLIC PANEL
Anion gap: 10 (ref 5–15)
BUN: 44 mg/dL — AB (ref 6–20)
CHLORIDE: 105 mmol/L (ref 101–111)
CO2: 26 mmol/L (ref 22–32)
Calcium: 8.9 mg/dL (ref 8.9–10.3)
Creatinine, Ser: 1.44 mg/dL — ABNORMAL HIGH (ref 0.61–1.24)
GFR calc Af Amer: 47 mL/min — ABNORMAL LOW (ref >60)
GFR calc non Af Amer: 41 mL/min — ABNORMAL LOW (ref >60)
GLUCOSE: 129 mg/dL — AB (ref 65–99)
POTASSIUM: 4 mmol/L (ref 3.5–5.1)
Sodium: 141 mmol/L (ref 135–145)

## 2015-08-29 LAB — CBC
HCT: 35.1 % — ABNORMAL LOW (ref 40.0–52.0)
HEMOGLOBIN: 11.8 g/dL — AB (ref 13.0–18.0)
MCH: 30 pg (ref 26.0–34.0)
MCHC: 33.5 g/dL (ref 32.0–36.0)
MCV: 89.8 fL (ref 80.0–100.0)
Platelets: 270 10*3/uL (ref 150–440)
RBC: 3.92 MIL/uL — AB (ref 4.40–5.90)
RDW: 14.4 % (ref 11.5–14.5)
WBC: 15.6 10*3/uL — ABNORMAL HIGH (ref 3.8–10.6)

## 2015-08-29 LAB — GLUCOSE, CAPILLARY: Glucose-Capillary: 154 mg/dL — ABNORMAL HIGH (ref 65–99)

## 2015-08-29 MED ORDER — CARVEDILOL 6.25 MG PO TABS: 6.2500 mg | ORAL_TABLET | Freq: Two times a day (BID) | ORAL | Status: AC

## 2015-08-29 MED ORDER — FUROSEMIDE 40 MG PO TABS
40.0000 mg | ORAL_TABLET | Freq: Every day | ORAL | Status: DC
  Administered 2015-08-29: 40 mg via ORAL
  Filled 2015-08-29: qty 1

## 2015-08-29 MED ORDER — INSULIN GLARGINE 100 UNIT/ML SOLOSTAR PEN: 25.0000 [IU] | PEN_INJECTOR | Freq: Every day | SUBCUTANEOUS | Status: AC

## 2015-08-29 MED ORDER — INSULIN ASPART 100 UNIT/ML FLEXPEN: 5.0000 [IU] | PEN_INJECTOR | Freq: Three times a day (TID) | SUBCUTANEOUS | Status: AC

## 2015-08-29 MED ORDER — FUROSEMIDE 40 MG PO TABS: 40.0000 mg | ORAL_TABLET | Freq: Every day | ORAL | Status: DC

## 2015-08-29 MED ORDER — PREDNISONE 10 MG PO TABS: ORAL_TABLET | ORAL | Status: DC

## 2015-08-29 NOTE — Progress Notes (Signed)
Patient discharged to assisted living facility, patient left the hospital with son at this time .

## 2015-08-29 NOTE — Progress Notes (Signed)
SATURATION QUALIFICATIONS: (This note is used to comply with regulatory documentation for home oxygen)  Patient Saturations on Room Air at Rest = 94%  Patient Saturations on Room Air while Ambulating = 88%  Patient Saturations on 2 Liters of oxygen while Ambulating =94 %  Please briefly explain why patient needs home oxygen:patient's oxygen saturations decreases with movement.

## 2015-08-29 NOTE — Care Management Note (Signed)
Case Management Note  Patient Details  Name: Martin [name] MRN: [id number] Date of Birth: 21-Mar-1922  Subjective/Objective:  CM consult for discharge planning. Met with son in law (Mr. Barnet Pall) and patient at bedside. Discussed home health nursing and telehealth program. Son in law is agreeable and has no agency preference. TC to [employer], spoke with [employer]. They use [facility] but his insurance does not cover [facility]. [facility] to [facility] and telehealth program. PCP is Dr. Mauricio Po.                 Action/Plan: [facility] and telehealth  Expected Discharge Date:   08/29/2015               Expected Discharge Plan:  Assisted Living / Rest Home  In-House Referral:     Discharge planning Services  CM Consult  Post Acute Care Choice:  Home Health Choice offered to:   (son in law)  DME Arranged:    DME Agency:     HH Arranged:  RN, Disease Management (tele health) [place]:  [place]  Status of Service:  Completed, signed off  Medicare Important Message Given:  Yes Date Medicare IM Given:    Medicare IM give by:    Date Additional Medicare IM Given:    Additional Medicare Important Message give by:     If discussed at [facility] of Stay Meetings, dates discussed:    Additional Comments:  Jolly Mango, RN 08/29/2015, 10:15 AM

## 2015-08-29 NOTE — NC FL2 (Addendum)
[place] MEDICAID FL2 LEVEL OF CARE SCREENING TOOL     IDENTIFICATION  Patient Name: Martin [name] Birthdate: 1921/12/26 Sex: male Admission Date (Current Location): 08/26/2015  [place] and [place] Number:  Chiropodist and Address:  [facility], [address], [place], [place] [address]      Provider Number: [id number]  Attending Physician Name and Address:  Houston Siren, MD  Relative Name and Phone Number:       Current Level of Care: Hospital Recommended Level of Care: Assisted Living Facility Prior Approval Number:    Date Approved/Denied:   PASRR Number:    Discharge Plan:  (ALF)    Current Diagnoses: Patient Active Problem List   Diagnosis Date Noted  . Acute on chronic systolic CHF (congestive heart failure) (HCC) 08/26/2015  . HCAP (healthcare-associated pneumonia) 06/20/2015  . Sepsis (HCC) 03/14/2015  . Diabetes mellitus type 2 in obese (HCC) 10/01/2014  . Chronic atrial fibrillation (HCC) 10/01/2014  . Chronic kidney disease, stage III (moderate) 10/01/2014  . Chronic systolic (congestive) heart failure (HCC) 10/01/2014  . Peripheral vascular disease (HCC) 10/01/2014  . Coronary artery disease 10/01/2014    Orientation RESPIRATION BLADDER Height & Weight     Situation, Self, Place  Normal Continent Weight: 239 lb 1.6 oz (108.455 kg) Height:  6' (182.9 cm)  BEHAVIORAL SYMPTOMS/MOOD NEUROLOGICAL BOWEL NUTRITION STATUS   (none)  (none) Continent Diet (heart healthy/carb modified)  AMBULATORY STATUS COMMUNICATION OF NEEDS Skin   Limited Assist Verbally Normal                       Personal Care Assistance Level of Assistance  Bathing, Dressing Bathing Assistance: Limited assistance   Dressing Assistance: Limited assistance     Functional Limitations Info  Hearing   Hearing Info: Impaired      SPECIAL CARE FACTORS FREQUENCY                       Contractures Contractures Info: Not present     Additional Factors Info  Code Status, Allergies Code Status Info: Full Allergies Info: nka           DISCHARGE MEDICATIONS:   Current Discharge Medication List    START taking these medications   Details  carvedilol (COREG) 6.25 MG tablet Take 1 tablet (6.25 mg total) by mouth 2 (two) times daily with a meal. Qty: 60 tablet, Refills: 1    furosemide (LASIX) 40 MG tablet Take 1 tablet (40 mg total) by mouth daily. Qty: 30 tablet, Refills: 1      CONTINUE these medications which have CHANGED   Details  insulin aspart (NOVOLOG FLEXPEN) 100 UNIT/ML FlexPen Inject 5 Units into the skin 3 (three) times daily with meals. Qty: 15 mL, Refills: 11    Insulin Glargine (LANTUS SOLOSTAR) 100 UNIT/ML Solostar Pen Inject 25 Units into the skin daily. Qty: 15 mL, Refills: 11    predniSONE (DELTASONE) 10 MG tablet Label & dispense according to the schedule below. 5 Pills PO for 1 day then, 4 Pills PO for 1 day, 3 Pills PO for 1 day, 2 Pills PO for 1 day, 1 Pill PO for 1 days then STOP. Qty: 15 tablet, Refills: 0      CONTINUE these medications which have NOT CHANGED   Details  albuterol (PROVENTIL HFA;VENTOLIN HFA) 108 (90 BASE) MCG/ACT inhaler Inhale 2 puffs into the lungs every 6 (six) hours as needed for  wheezing or shortness of breath. Qty: 1 Inhaler, Refills: 2    aspirin 325 MG tablet Take 325 mg by mouth daily.     dextrose (GLUTOSE) 40 % GEL Take 1 Tube by mouth as needed for low blood sugar.    docusate sodium (COLACE) 100 MG capsule Take 100 mg by mouth 2 (two) times daily.    gabapentin (NEURONTIN) 300 MG capsule Take 300-600 mg by mouth 3 (three) times daily. Take 1 capsule (300 mg) at 0900 & 1400 and 2 capsules (600 mg) at 2000.    glucose blood test strip 1 each by Other route 3 (three) times daily before meals. Use as instructed    guaiFENesin-dextromethorphan (ROBITUSSIN DM) 100-10 MG/5ML syrup Take 15 mLs by  mouth every 4 (four) hours as needed for cough.    Insulin Pen Needle (NOVOFINE) 32G X 6 MM MISC 1 Device by Does not apply route 3 (three) times daily with meals. To use with Novolog FlexPen    ipratropium-albuterol (DUONEB) 0.5-2.5 (3) MG/3ML SOLN Take 3 mLs by nebulization every 6 (six) hours as needed (shortness of breath).    nitroGLYCERIN (NITROSTAT) 0.4 MG SL tablet Place 1 tablet (0.4 mg total) under the tongue every 5 (five) minutes as needed for chest pain. Qty: 30 tablet, Refills: 1    oxyCODONE-acetaminophen (PERCOCET/ROXICET) 5-325 MG tablet Take 0.5 tablets by mouth at bedtime. Pt also takes one-half tablet every six hours as needed for moderate/severe pain. Qty: 30 tablet, Refills: 0    polyethylene glycol (MIRALAX / GLYCOLAX) packet Take 17 g by mouth every Monday, Wednesday, and Friday.     pravastatin (PRAVACHOL) 20 MG tablet Take 20 mg by mouth at bedtime.     rivaroxaban (XARELTO) 20 MG TABS tablet Take 20 mg by mouth daily.    terazosin (HYTRIN) 2 MG capsule Take 2 mg by mouth at bedtime.     triazolam (HALCION) 0.25 MG tablet Take 0.5 mg by mouth at bedtime.     vitamin B-12 (CYANOCOBALAMIN) 500 MCG tablet Take 500 mcg by mouth daily.            Please see discharge summary for a list of discharge medications.  Relevant Imaging Results:  Relevant Lab Results:   Additional Information    York SpanielMonica Raziyah Vanvleck, LCSW

## 2015-08-29 NOTE — Progress Notes (Signed)
Initial Heart Failure Clinic appointment scheduled for Sep 07, 2015 at 12:30pm. Of note, patient cancelled his previous appointment on 08/10/15. Thank you.

## 2015-08-29 NOTE — Discharge Summary (Signed)
[facility] - [place] [facility]   PATIENT NAME: Martin RosenthalWalter [name]    MR#:  [id number]  DATE OF BIRTH:  July 07, 1921  DATE OF ADMISSION:  08/26/2015 ADMITTING PHYSICIAN: Oralia Manisavid Willis, MD  DATE OF DISCHARGE: 08/29/2015  PRIMARY CARE PHYSICIAN: [facility].    ADMISSION DIAGNOSIS:  Peripheral edema [R60.9] Dyspnea [R06.00] Acute on chronic systolic CHF (congestive heart failure) (HCC) [I50.23]  DISCHARGE DIAGNOSIS:  Principal Problem:   Acute on chronic systolic CHF (congestive heart failure) (HCC) Active Problems:   Diabetes mellitus type 2 in obese (HCC)   Chronic atrial fibrillation (HCC)   Chronic kidney disease, stage III (moderate)   Peripheral vascular disease (HCC)   Coronary artery disease   SECONDARY DIAGNOSIS:   Past Medical History  Diagnosis Date  . Coronary artery disease   . CHF (congestive heart failure) (HCC)     ischemic cardiomyopathy- EF 25%  . Hyperlipidemia   . Diabetes mellitus without complication (HCC)   . Atrial fibrillation (HCC)   . BPH (benign prostatic hyperplasia)   . Peripheral vascular disease (HCC)   . Chronic kidney disease   . Insomnia   . Renal insufficiency   . Asthma     HOSPITAL COURSE:   82 year old male with past medical history of coronary artery disease, systolic CHF, diabetes, chronic afibrillation, hyperlipidemia, peripheral vascular disease, chronic kidney disease who presents to the hospital due to shortness of breath.  1. Acute respiratory failure with hypoxia- this was likely due to underlying CHF combined with also COPD. -Patient was treated with IV Lasix for his CHF and also given IV steroids, DuoNeb nebs and Pulmicort nebs for his COPD. His wheezing bronchospasm has significantly improved and therefore now he is being discharged home.  2. CHF-acute on chronic systolic dysfunction. -Patient has a cardiomyopathy ejection fraction of 25-30% -Patient was aggressively diuresed with IV Lasix  and he is about 5 L negative since admission. He clinically feels better. He was not on any diuretics prior to coming into the hospital and now he is being discharged on Lasix 40 mg daily. -He was also started on some Coreg. He has some mild renal failure therefore ACE inhibitor was held. Further adjustment to his CHF regimen can be done as an outpatient through his cardiologist's office and also at the CHF clinic.  3. COPD-mild acute exacerbation. This was likely secondary to mild bronchitis. -After aggressive therapy with IV steroids, DuoNeb's, Pulmicort nebs C has significant improved. He is now being discharged on a prednisone taper and he will continue his nebulizer treatments as needed.  4. History of chronic atrial fibrillation-patient is currently rate controlled. He was started on some low-dose Coreg prior to discharge. -he will Continue Xarelto.  5. BPH-he will continue Terazosin. No evidence of urinary retention.  6. Diabetes type 2 without complication-she spent sugars are a little bit uncontrolled. His hemoglobin A1c was high as 9. A diabetes coordinator consult was obtained. At present patient's Lantus dose is being increased to 25 units daily along with NovoLog 5 units 3 times a day with meals. He should follow up with his primary care physician for further adjustments to his diabetic regimen.  -His blood sugars are currently stable prior to discharge  7. Hyperlipidemia- he will continue Pravachol.   DISCHARGE CONDITIONS:   Stable  CONSULTS OBTAINED:  Treatment Team:  Dalia HeadingKenneth A Fath, MD  DRUG ALLERGIES:  No Known Allergies  DISCHARGE MEDICATIONS:   Current Discharge Medication List    START taking  these medications   Details  carvedilol (COREG) 6.25 MG tablet Take 1 tablet (6.25 mg total) by mouth 2 (two) times daily with a meal. Qty: 60 tablet, Refills: 1    furosemide (LASIX) 40 MG tablet Take 1 tablet (40 mg total) by mouth daily. Qty: 30 tablet, Refills: 1       CONTINUE these medications which have CHANGED   Details  insulin aspart (NOVOLOG FLEXPEN) 100 UNIT/ML FlexPen Inject 5 Units into the skin 3 (three) times daily with meals. Qty: 15 mL, Refills: 11    Insulin Glargine (LANTUS SOLOSTAR) 100 UNIT/ML Solostar Pen Inject 25 Units into the skin daily. Qty: 15 mL, Refills: 11    predniSONE (DELTASONE) 10 MG tablet Label  & dispense according to the schedule below. 5 Pills PO for 1 day then, 4 Pills PO for 1 day, 3 Pills PO for 1 day, 2 Pills PO for 1 day, 1 Pill PO for 1 days then STOP. Qty: 15 tablet, Refills: 0      CONTINUE these medications which have NOT CHANGED   Details  albuterol (PROVENTIL HFA;VENTOLIN HFA) 108 (90 BASE) MCG/ACT inhaler Inhale 2 puffs into the lungs every 6 (six) hours as needed for wheezing or shortness of breath. Qty: 1 Inhaler, Refills: 2    aspirin 325 MG tablet Take 325 mg by mouth daily.     dextrose (GLUTOSE) 40 % GEL Take 1 Tube by mouth as needed for low blood sugar.    docusate sodium (COLACE) 100 MG capsule Take 100 mg by mouth 2 (two) times daily.    gabapentin (NEURONTIN) 300 MG capsule Take 300-600 mg by mouth 3 (three) times daily. Take 1 capsule (300 mg) at 0900 & 1400 and 2 capsules (600 mg) at 2000.    glucose blood test strip 1 each by Other route 3 (three) times daily before meals. Use as instructed    guaiFENesin-dextromethorphan (ROBITUSSIN DM) 100-10 MG/5ML syrup Take 15 mLs by mouth every 4 (four) hours as needed for cough.    Insulin Pen Needle (NOVOFINE) 32G X 6 MM MISC 1 Device by Does not apply route 3 (three) times daily with meals. To use with Novolog FlexPen    ipratropium-albuterol (DUONEB) 0.5-2.5 (3) MG/3ML SOLN Take 3 mLs by nebulization every 6 (six) hours as needed (shortness of breath).    nitroGLYCERIN (NITROSTAT) 0.4 MG SL tablet Place 1 tablet (0.4 mg total) under the tongue every 5 (five) minutes as needed for chest pain. Qty: 30 tablet, Refills: 1     oxyCODONE-acetaminophen (PERCOCET/ROXICET) 5-325 MG tablet Take 0.5 tablets by mouth at bedtime. Pt also takes one-half tablet every six hours as needed for moderate/severe pain. Qty: 30 tablet, Refills: 0    polyethylene glycol (MIRALAX / GLYCOLAX) packet Take 17 g by mouth every Monday, Wednesday, and Friday.     pravastatin (PRAVACHOL) 20 MG tablet Take 20 mg by mouth at bedtime.     rivaroxaban (XARELTO) 20 MG TABS tablet Take 20 mg by mouth daily.    terazosin (HYTRIN) 2 MG capsule Take 2 mg by mouth at bedtime.     triazolam (HALCION) 0.25 MG tablet Take 0.5 mg by mouth at bedtime.     vitamin B-12 (CYANOCOBALAMIN) 500 MCG tablet Take 500 mcg by mouth daily.         DISCHARGE INSTRUCTIONS:   DIET:  Cardiac diet and Diabetic diet  DISCHARGE CONDITION:  Stable  ACTIVITY:  Activity as tolerated  OXYGEN:  Home Oxygen: No.  Oxygen Delivery: room air  DISCHARGE LOCATION:  Assisted living with home health nursing.   If you experience worsening of your admission symptoms, develop shortness of breath, life threatening emergency, suicidal or homicidal thoughts you must seek medical attention immediately by calling 911 or calling your MD immediately  if symptoms less severe.  You Must read complete instructions/literature along with all the possible adverse reactions/side effects for all the Medicines you take and that have been prescribed to you. Take any new Medicines after you have completely understood and accpet all the possible adverse reactions/side effects.   Please note  You were cared for by a hospitalist during your hospital stay. If you have any questions about your discharge medications or the care you received while you were in the hospital after you are discharged, you can call the unit and asked to speak with the hospitalist on call if the hospitalist that took care of you is not available. Once you are discharged, your primary care physician will handle any  further medical issues. Please note that NO REFILLS for any discharge medications will be authorized once you are discharged, as it is imperative that you return to your primary care physician (or establish a relationship with a primary care physician if you do not have one) for your aftercare needs so that they can reassess your need for medications and monitor your lab values.     Today   Shortness of breath much improved. Minimal wheezing. Wants to go home.  VITAL SIGNS:  Blood pressure 133/80, pulse 107, temperature 97.6 F (36.4 C), temperature source Oral, resp. rate 16, height 6' (1.829 m), weight 108.455 kg (239 lb 1.6 oz), SpO2 95 %.  I/O:   Intake/Output Summary (Last 24 hours) at 08/29/15 1012 Last data filed at 08/29/15 0810  Gross per 24 hour  Intake    360 ml  Output   1536 ml  Net  -1176 ml    PHYSICAL EXAMINATION:  GENERAL:  82 y.o.-year-old obese patient sitting up in the bed in no acute distress.  EYES: Pupils equal, round, reactive to light and accommodation. No scleral icterus. Extraocular muscles intact.  HEENT: Head atraumatic, normocephalic. Oropharynx and nasopharynx clear.  NECK:  Supple, no jugular venous distention. No thyroid enlargement, no tenderness.  LUNGS: Normal breath sounds bilaterally, minimal end expiratory wheezing, No rales,rhonchi. No use of accessory muscles of respiration.  CARDIOVASCULAR: S1, S2 normal. No murmurs, rubs, or gallops.  ABDOMEN: Soft, non-tender, non-distended. Bowel sounds present. No organomegaly or mass.  EXTREMITIES: +1-2 pedal edema b/l, No cyanosis, or clubbing.  NEUROLOGIC: Cranial nerves II through XII are intact. No focal motor or sensory defecits b/l.  PSYCHIATRIC: The patient is alert and oriented x 3. Good affect.  SKIN: No obvious rash, lesion, or ulcer.   DATA REVIEW:   CBC  Recent Labs Lab 08/29/15 0443  WBC 15.6*  HGB 11.8*  HCT 35.1*  PLT 270    Chemistries   Recent Labs Lab 08/29/15 0443   NA 141  K 4.0  CL 105  CO2 26  GLUCOSE 129*  BUN 44*  CREATININE 1.44*  CALCIUM 8.9    Cardiac Enzymes  Recent Labs Lab 08/27/15 1312  TROPONINI 0.14*    Microbiology Results  Results for orders placed or performed during the hospital encounter of 08/26/15  MRSA PCR Screening     Status: None   Collection Time: 08/27/15  1:11 AM  Result Value Ref Range Status   MRSA by PCR  NEGATIVE NEGATIVE Final    Comment:        The GeneXpert MRSA Assay (FDA approved for NASAL specimens only), is one component of a comprehensive MRSA colonization surveillance program. It is not intended to diagnose MRSA infection nor to guide or monitor treatment for MRSA infections.     RADIOLOGY:  No results found.    Management plans discussed with the patient, family and they are in agreement.  CODE STATUS:     Code Status Orders        Start     Ordered   08/27/15 0058  Full code   Continuous     08/27/15 0057    Code Status History    Date Active Date Inactive Code Status Order ID Comments User Context   07/28/2015  2:58 AM 07/31/2015  8:08 PM Full Code [id number]  Oralia Manis, MD Inpatient   06/20/2015 12:04 PM 06/23/2015  5:10 PM Full Code [id number]  Milagros Loll, MD ED   03/14/2015 10:19 PM 03/18/2015  5:46 PM Full Code [id number]  Wyatt Haste, MD ED   10/02/2014  3:05 AM 10/02/2014  5:53 PM Full Code [id number]  Gale Journey, MD Inpatient   09/10/2014 12:05 PM 09/12/2014  9:31 PM Full Code [id number]  Altamese Dilling, MD Inpatient      TOTAL TIME TAKING CARE OF THIS PATIENT: 40 minutes.    Houston Siren M.D on 08/29/2015 at 10:12 AM  Between 7am to 6pm - Pager - [phone]  After 6pm go to [email or web address] - password [email or web address]  [facility]  Office  [phone]  CC: Primary care physician; Pcp Not In System

## 2015-08-29 NOTE — Progress Notes (Signed)
Bedside report received, patient restting in the bed at this time, denies any pain, patient removed his tele because drsaid his going home. patient to keep tele on til discharge, tele off

## 2015-08-29 NOTE — Clinical Social Work Note (Signed)
Patient discharged today to return to [facility]. Patient's son was in the room and transported patient to return to [facility]. Patient's son stated that they have not been pleased with [facility] but do not feel as though they have had a choice since he admits to understanding that medicaid beds are limited. CSW received call from [name] in order to clarify medications. No further needs. York SpanielMonica Delvon Chipps MSW,LCSW [phone]

## 2015-08-29 NOTE — Progress Notes (Signed)
[facility]  SUBJECTIVE: Feeling better with less bronchospasm and sob. Down approximately 5 liters since admission and 1 liter past 24 hours. Renal function and electrolytes stable.   Filed Vitals:   08/28/15 1959 08/28/15 2035 08/29/15 0436 08/29/15 0439  BP: 142/84  151/99 133/80  Pulse: 54  79 107  Temp: 97.6 F (36.4 C)  97.6 F (36.4 C)   TempSrc: Oral  Oral   Resp: 16  16   Height:      Weight:   108.455 kg (239 lb 1.6 oz)   SpO2: 96% 96% 92% 91%    Intake/Output Summary (Last 24 hours) at 08/29/15 0801 Last data filed at 08/29/15 0440  Gross per 24 hour  Intake    600 ml  Output   1686 ml  Net  -1086 ml    LABS: Basic Metabolic Panel:  Recent Labs  16/02/9603/24/17 0438 08/29/15 0443  NA 140 141  K 4.6 4.0  CL 104 105  CO2 28 26  GLUCOSE 310* 129*  BUN 35* 44*  CREATININE 1.52* 1.44*  CALCIUM 9.0 8.9   Liver Function Tests: No results for input(s): AST, ALT, ALKPHOS, BILITOT, PROT, ALBUMIN in the last 72 hours. No results for input(s): LIPASE, AMYLASE in the last 72 hours. CBC:  Recent Labs  08/27/15 0142 08/29/15 0443  WBC 9.3 15.6*  HGB 11.6* 11.8*  HCT 34.8* 35.1*  MCV 89.1 89.8  PLT 236 270   Cardiac Enzymes:  Recent Labs  08/27/15 0142 08/27/15 0649 08/27/15 1312  TROPONINI 0.09* 0.10* 0.14*   BNP: Invalid input(s): POCBNP D-Dimer: No results for input(s): DDIMER in the last 72 hours. Hemoglobin A1C:  Recent Labs  08/27/15 0142  HGBA1C 9.4*   Fasting Lipid Panel: No results for input(s): CHOL, HDL, LDLCALC, TRIG, CHOLHDL, LDLDIRECT in the last 72 hours. Thyroid Function Tests: No results for input(s): TSH, T4TOTAL, T3FREE, THYROIDAB in the last 72 hours.  Invalid input(s): FREET3 Anemia Panel: No results for input(s): VITAMINB12, FOLATE, FERRITIN, TIBC, IRON, RETICCTPCT in the last 72 hours.   Physical Exam: Blood pressure 133/80, pulse 107, temperature 97.6 F (36.4 C), temperature  source Oral, resp. rate 16, height 6' (1.829 m), weight 108.455 kg (239 lb 1.6 oz), SpO2 91 %.   Wt Readings from Last 1 Encounters:  08/29/15 108.455 kg (239 lb 1.6 oz)     General appearance: cooperative Resp: rhonchi bilaterally Cardio: irregularly irregular rhythm Extremities: edema 1-2+ Neurologic: Grossly normal  TELEMETRY: Reviewed telemetry pt in afib with controlled vr:  ASSESSMENT AND PLAN:  Principal Problem:   Acute on chronic systolic CHF (congestive heart failure) (HCC)-clinically improved after good diuresis. Will stop iv lasix and place on po lasix at 40 mg daily Active Problems:   Diabetes mellitus type 2 in obese (HCC)   Chronic atrial fibrillation (HCC)-rate controlled   Chronic kidney disease, stage III (moderate)-stable   Peripheral vascular disease (HCC)   Coronary artery disease    Dalia HeadingFATH,Eugenia Eldredge A., MD, [facility] CenterFACC 08/29/2015 8:01 AM

## 2015-08-29 NOTE — Care Management Important Message (Signed)
Important Message  Patient Details  Name: Martin [name] MRN: [id number] Date of Birth: 1921/10/06   Medicare Important Message Given:  Yes    Olegario MessierKathy A Deshun Sedivy 08/29/2015, 10:29 AM

## 2015-08-29 NOTE — Discharge Instructions (Signed)
Heart Failure Clinic appointment on Sep 07, 2015 at 12:30pm with Clarisa Kindredina Mai Longnecker, FNP. Please call [phone] to reschedule.

## 2015-09-07 ENCOUNTER — Ambulatory Visit: Payer: Self-pay | Admitting: Family

## 2015-10-18 ENCOUNTER — Emergency Department: Payer: Medicare Other

## 2015-10-18 ENCOUNTER — Emergency Department
Admission: EM | Admit: 2015-10-18 | Discharge: 2015-10-18 | Disposition: A | Discharge status: Home / Self Care | Payer: Medicare Other | Attending: Emergency Medicine | Admitting: Emergency Medicine

## 2015-10-18 DIAGNOSIS — Z79899 Other long term (current) drug therapy: Secondary | ICD-10-CM | POA: Insufficient documentation

## 2015-10-18 DIAGNOSIS — Y999 Unspecified external cause status: Secondary | ICD-10-CM | POA: Insufficient documentation

## 2015-10-18 DIAGNOSIS — I5023 Acute on chronic systolic (congestive) heart failure: Secondary | ICD-10-CM | POA: Diagnosis not present

## 2015-10-18 DIAGNOSIS — E785 Hyperlipidemia, unspecified: Secondary | ICD-10-CM | POA: Diagnosis not present

## 2015-10-18 DIAGNOSIS — E1122 Type 2 diabetes mellitus with diabetic chronic kidney disease: Secondary | ICD-10-CM | POA: Diagnosis not present

## 2015-10-18 DIAGNOSIS — Z8679 Personal history of other diseases of the circulatory system: Secondary | ICD-10-CM | POA: Insufficient documentation

## 2015-10-18 DIAGNOSIS — S0003XA Contusion of scalp, initial encounter: Secondary | ICD-10-CM | POA: Diagnosis not present

## 2015-10-18 DIAGNOSIS — M79662 Pain in left lower leg: Secondary | ICD-10-CM

## 2015-10-18 DIAGNOSIS — I482 Chronic atrial fibrillation: Secondary | ICD-10-CM | POA: Insufficient documentation

## 2015-10-18 DIAGNOSIS — J45909 Unspecified asthma, uncomplicated: Secondary | ICD-10-CM | POA: Insufficient documentation

## 2015-10-18 DIAGNOSIS — S80212A Abrasion, left knee, initial encounter: Secondary | ICD-10-CM | POA: Insufficient documentation

## 2015-10-18 DIAGNOSIS — Z794 Long term (current) use of insulin: Secondary | ICD-10-CM | POA: Insufficient documentation

## 2015-10-18 DIAGNOSIS — Y9389 Activity, other specified: Secondary | ICD-10-CM | POA: Insufficient documentation

## 2015-10-18 DIAGNOSIS — M79632 Pain in left forearm: Secondary | ICD-10-CM | POA: Diagnosis not present

## 2015-10-18 DIAGNOSIS — I130 Hypertensive heart and chronic kidney disease with heart failure and stage 1 through stage 4 chronic kidney disease, or unspecified chronic kidney disease: Secondary | ICD-10-CM | POA: Insufficient documentation

## 2015-10-18 DIAGNOSIS — Z7982 Long term (current) use of aspirin: Secondary | ICD-10-CM | POA: Diagnosis not present

## 2015-10-18 DIAGNOSIS — N189 Chronic kidney disease, unspecified: Secondary | ICD-10-CM | POA: Diagnosis not present

## 2015-10-18 DIAGNOSIS — I255 Ischemic cardiomyopathy: Secondary | ICD-10-CM | POA: Diagnosis not present

## 2015-10-18 DIAGNOSIS — Y92480 Sidewalk as the place of occurrence of the external cause: Secondary | ICD-10-CM | POA: Insufficient documentation

## 2015-10-18 DIAGNOSIS — I2510 Atherosclerotic heart disease of native coronary artery without angina pectoris: Secondary | ICD-10-CM | POA: Diagnosis not present

## 2015-10-18 DIAGNOSIS — S098XXA Other specified injuries of head, initial encounter: Secondary | ICD-10-CM

## 2015-10-18 DIAGNOSIS — S0990XA Unspecified injury of head, initial encounter: Secondary | ICD-10-CM | POA: Diagnosis present

## 2015-10-18 DIAGNOSIS — Z951 Presence of aortocoronary bypass graft: Secondary | ICD-10-CM | POA: Insufficient documentation

## 2015-10-18 DIAGNOSIS — S0081XA Abrasion of other part of head, initial encounter: Secondary | ICD-10-CM | POA: Insufficient documentation

## 2015-10-18 MED ORDER — TETANUS-DIPHTH-ACELL PERTUSSIS 5-2.5-18.5 LF-MCG/0.5 IM SUSP
0.5000 mL | Freq: Once | INTRAMUSCULAR | Status: AC
  Administered 2015-10-18: 0.5 mL via INTRAMUSCULAR
  Filled 2015-10-18: qty 0.5

## 2015-10-18 MED ORDER — BACITRACIN ZINC 500 UNIT/GM EX OINT
TOPICAL_OINTMENT | CUTANEOUS | Status: AC
  Administered 2015-10-18: 1 via TOPICAL
  Filled 2015-10-18: qty 0.9

## 2015-10-18 NOTE — ED Notes (Signed)
Pt arrived via ems - [facility] lives at [facility] [place] and was out on the patio alone - He states he was looking across the road and ran his wheelchair off the small edging and fell out of the wheelchair - He has a small abrasion to left knee and is c/o left lower leg pain - He has a lac above left eyebrow without active bleeding at this time - He is also c/o left arm pain but is using the arm without difficulty and has ROM

## 2015-10-18 NOTE — ED Notes (Signed)
See ems triage note

## 2015-10-18 NOTE — ED Notes (Signed)
Patient is resting comfortably. 
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
## 2015-10-18 NOTE — ED Notes (Signed)
Pt transported to X-ray. Gold necklace was removed and given to pt. Pt placed hs necklace in his right pocket.

## 2015-10-18 NOTE — Discharge Instructions (Signed)

## 2015-10-18 NOTE — ED Notes (Signed)
Laceration to the eyebrow, upper left side uppersterile saline and gauze. Pt tolerated well.

## 2015-11-05 IMAGING — CR DG CHEST 1V PORT
1 series · 1 of 1 positions shown · non-contrast
Comparison: 05/17/2014

CLINICAL DATA: Altered mental status.

EXAM:
PORTABLE CHEST - 1 VIEW

[portable]
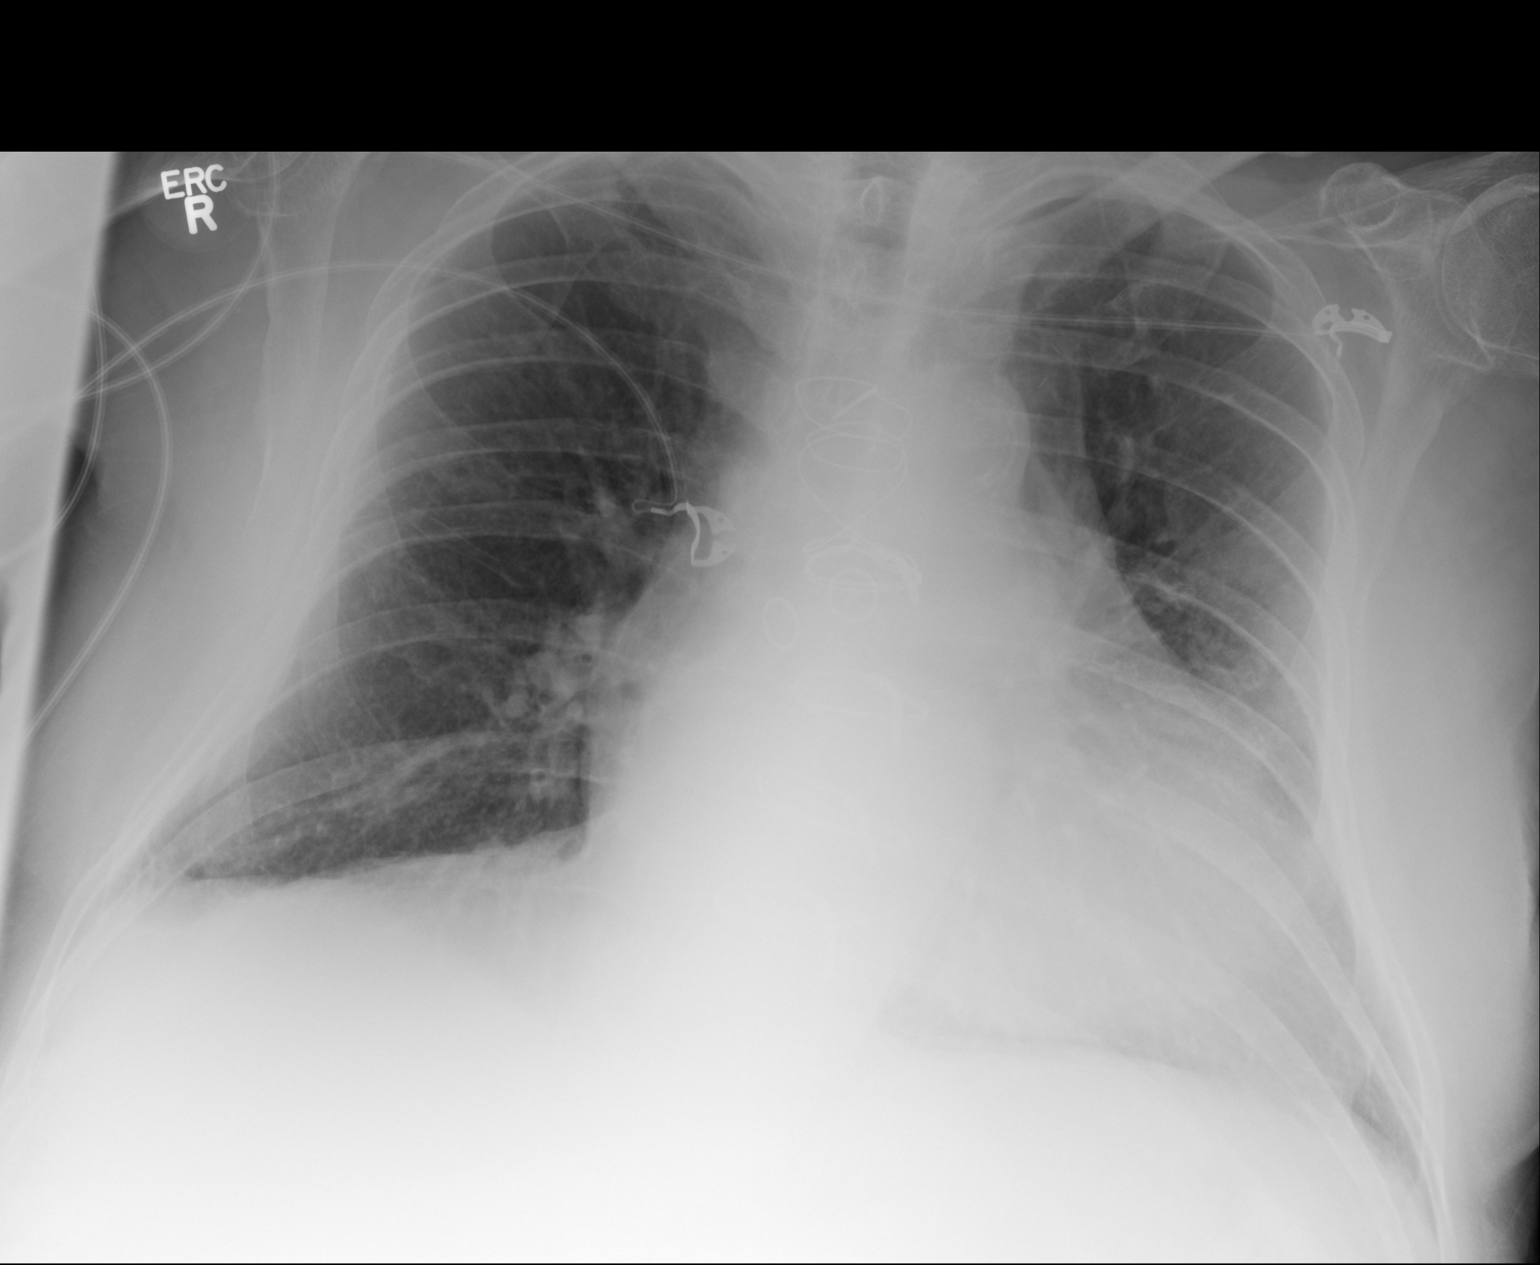

[1 of 1 positions shown; findings below may reference images not displayed]

FINDINGS: Chronic cardiomegaly. The patient is status post median sternotomy
for CABG. Stable aortic and hilar contours. Mild hyperinflation.
There is no edema, consolidation, effusion, or pneumothorax.
IMPRESSION: No active disease.

## 2015-11-06 ENCOUNTER — Inpatient Hospital Stay
Admission: EM | Admit: 2015-11-06 | Discharge: 2015-11-09 | DRG: 291 | Disposition: A | Discharge status: Home / Self Care | Payer: Medicare Other | Attending: Internal Medicine | Admitting: Internal Medicine

## 2015-11-06 ENCOUNTER — Encounter: Payer: Self-pay | Admitting: Emergency Medicine

## 2015-11-06 ENCOUNTER — Emergency Department: Payer: Medicare Other

## 2015-11-06 DIAGNOSIS — I2510 Atherosclerotic heart disease of native coronary artery without angina pectoris: Secondary | ICD-10-CM | POA: Diagnosis present

## 2015-11-06 DIAGNOSIS — Z951 Presence of aortocoronary bypass graft: Secondary | ICD-10-CM

## 2015-11-06 DIAGNOSIS — E1122 Type 2 diabetes mellitus with diabetic chronic kidney disease: Secondary | ICD-10-CM | POA: Diagnosis present

## 2015-11-06 DIAGNOSIS — N189 Chronic kidney disease, unspecified: Secondary | ICD-10-CM | POA: Diagnosis present

## 2015-11-06 DIAGNOSIS — R338 Other retention of urine: Secondary | ICD-10-CM | POA: Diagnosis present

## 2015-11-06 DIAGNOSIS — I5023 Acute on chronic systolic (congestive) heart failure: Principal | ICD-10-CM | POA: Diagnosis present

## 2015-11-06 DIAGNOSIS — N3941 Urge incontinence: Secondary | ICD-10-CM | POA: Diagnosis present

## 2015-11-06 DIAGNOSIS — G4700 Insomnia, unspecified: Secondary | ICD-10-CM | POA: Diagnosis present

## 2015-11-06 DIAGNOSIS — I4891 Unspecified atrial fibrillation: Secondary | ICD-10-CM | POA: Diagnosis present

## 2015-11-06 DIAGNOSIS — J9600 Acute respiratory failure, unspecified whether with hypoxia or hypercapnia: Secondary | ICD-10-CM | POA: Diagnosis present

## 2015-11-06 DIAGNOSIS — I255 Ischemic cardiomyopathy: Secondary | ICD-10-CM | POA: Diagnosis present

## 2015-11-06 DIAGNOSIS — N401 Enlarged prostate with lower urinary tract symptoms: Secondary | ICD-10-CM | POA: Diagnosis present

## 2015-11-06 DIAGNOSIS — H9190 Unspecified hearing loss, unspecified ear: Secondary | ICD-10-CM | POA: Diagnosis present

## 2015-11-06 DIAGNOSIS — Z833 Family history of diabetes mellitus: Secondary | ICD-10-CM

## 2015-11-06 DIAGNOSIS — J441 Chronic obstructive pulmonary disease with (acute) exacerbation: Secondary | ICD-10-CM | POA: Diagnosis present

## 2015-11-06 DIAGNOSIS — Z7982 Long term (current) use of aspirin: Secondary | ICD-10-CM | POA: Diagnosis not present

## 2015-11-06 DIAGNOSIS — R062 Wheezing: Secondary | ICD-10-CM

## 2015-11-06 DIAGNOSIS — I739 Peripheral vascular disease, unspecified: Secondary | ICD-10-CM | POA: Diagnosis present

## 2015-11-06 DIAGNOSIS — I509 Heart failure, unspecified: Secondary | ICD-10-CM

## 2015-11-06 LAB — URINALYSIS COMPLETE WITH MICROSCOPIC (ARMC ONLY)
Bacteria, UA: NONE SEEN
Bilirubin Urine: NEGATIVE
Glucose, UA: 50 mg/dL — AB
HGB URINE DIPSTICK: NEGATIVE
KETONES UR: NEGATIVE mg/dL
LEUKOCYTES UA: NEGATIVE
NITRITE: NEGATIVE
PROTEIN: NEGATIVE mg/dL
RBC / HPF: NONE SEEN RBC/hpf (ref 0–5)
SPECIFIC GRAVITY, URINE: 1.005 (ref 1.005–1.030)
Squamous Epithelial / LPF: NONE SEEN
pH: 6 (ref 5.0–8.0)

## 2015-11-06 LAB — LACTIC ACID, PLASMA
LACTIC ACID, VENOUS: 1.6 mmol/L (ref 0.5–1.9)
LACTIC ACID, VENOUS: 1.3 mmol/L (ref 0.5–1.9)

## 2015-11-06 LAB — CBC WITH DIFFERENTIAL/PLATELET
BASOS ABS: 0.1 10*3/uL (ref 0–0.1)
BASOS PCT: 1 %
Eosinophils Absolute: 0.5 10*3/uL (ref 0–0.7)
Eosinophils Relative: 5 %
HEMATOCRIT: 36.2 % — AB (ref 40.0–52.0)
HEMOGLOBIN: 12 g/dL — AB (ref 13.0–18.0)
LYMPHS PCT: 13 %
Lymphs Abs: 1.3 10*3/uL (ref 1.0–3.6)
MCH: 29.9 pg (ref 26.0–34.0)
MCHC: 33.3 g/dL (ref 32.0–36.0)
MCV: 89.9 fL (ref 80.0–100.0)
Monocytes Absolute: 0.7 10*3/uL (ref 0.2–1.0)
Monocytes Relative: 7 %
NEUTROS ABS: 7.5 10*3/uL — AB (ref 1.4–6.5)
NEUTROS PCT: 74 %
Platelets: 256 10*3/uL (ref 150–440)
RBC: 4.03 MIL/uL — ABNORMAL LOW (ref 4.40–5.90)
RDW: 13.3 % (ref 11.5–14.5)
WBC: 10.1 10*3/uL (ref 3.8–10.6)

## 2015-11-06 LAB — BASIC METABOLIC PANEL
ANION GAP: 9 (ref 5–15)
BUN: 21 mg/dL — ABNORMAL HIGH (ref 6–20)
CHLORIDE: 99 mmol/L — AB (ref 101–111)
CO2: 29 mmol/L (ref 22–32)
Calcium: 8.8 mg/dL — ABNORMAL LOW (ref 8.9–10.3)
Creatinine, Ser: 1.2 mg/dL (ref 0.61–1.24)
GFR calc non Af Amer: 51 mL/min — ABNORMAL LOW (ref >60)
GFR, EST AFRICAN AMERICAN: 59 mL/min — AB (ref >60)
Glucose, Bld: 251 mg/dL — ABNORMAL HIGH (ref 65–99)
POTASSIUM: 3.6 mmol/L (ref 3.5–5.1)
SODIUM: 137 mmol/L (ref 135–145)

## 2015-11-06 LAB — BRAIN NATRIURETIC PEPTIDE: B Natriuretic Peptide: 551 pg/mL — ABNORMAL HIGH (ref 0.0–100.0)

## 2015-11-06 LAB — TROPONIN I
TROPONIN I: 0.04 ng/mL — AB (ref <0.03)
Troponin I: <0.03 ng/mL (ref <0.03)

## 2015-11-06 MED ORDER — TRIAZOLAM 0.25 MG PO TABS
0.5000 mg | ORAL_TABLET | Freq: Every day | ORAL | Status: DC
  Administered 2015-11-06 – 2015-11-08 (×3): 0.5 mg via ORAL
  Filled 2015-11-06 (×3): qty 2

## 2015-11-06 MED ORDER — TERAZOSIN HCL 2 MG PO CAPS
2.0000 mg | ORAL_CAPSULE | Freq: Every day | ORAL | Status: DC
  Administered 2015-11-06 – 2015-11-08 (×3): 2 mg via ORAL
  Filled 2015-11-06 (×3): qty 1

## 2015-11-06 MED ORDER — METHYLPREDNISOLONE SODIUM SUCC 125 MG IJ SOLR
125.0000 mg | Freq: Once | INTRAMUSCULAR | Status: AC
Start: 2015-11-06 — End: 2015-11-06
  Administered 2015-11-06: 125 mg via INTRAVENOUS
  Filled 2015-11-06: qty 2

## 2015-11-06 MED ORDER — POLYETHYLENE GLYCOL 3350 17 G PO PACK
17.0000 g | PACK | ORAL | Status: DC
  Administered 2015-11-08: 17 g via ORAL
  Filled 2015-11-06: qty 1

## 2015-11-06 MED ORDER — VITAMIN B-12 1000 MCG PO TABS
500.0000 ug | ORAL_TABLET | Freq: Every day | ORAL | Status: DC
  Administered 2015-11-07 – 2015-11-09 (×3): 500 ug via ORAL
  Filled 2015-11-06 (×3): qty 1

## 2015-11-06 MED ORDER — METHYLPREDNISOLONE SODIUM SUCC 40 MG IJ SOLR
40.0000 mg | INTRAMUSCULAR | Status: DC
  Administered 2015-11-07: 40 mg via INTRAVENOUS
  Filled 2015-11-06: qty 1

## 2015-11-06 MED ORDER — PRAVASTATIN SODIUM 20 MG PO TABS
20.0000 mg | ORAL_TABLET | Freq: Every day | ORAL | Status: DC
  Administered 2015-11-06 – 2015-11-08 (×3): 20 mg via ORAL
  Filled 2015-11-06 (×3): qty 1

## 2015-11-06 MED ORDER — ASPIRIN 325 MG PO TABS
325.0000 mg | ORAL_TABLET | Freq: Every day | ORAL | Status: DC
  Administered 2015-11-07 – 2015-11-09 (×3): 325 mg via ORAL
  Filled 2015-11-06 (×3): qty 1

## 2015-11-06 MED ORDER — INSULIN ASPART 100 UNIT/ML ~~LOC~~ SOLN
5.0000 [IU] | Freq: Three times a day (TID) | SUBCUTANEOUS | Status: DC
  Administered 2015-11-07 (×2): 5 [IU] via SUBCUTANEOUS
  Administered 2015-11-07: 10 [IU] via SUBCUTANEOUS
  Administered 2015-11-08 – 2015-11-09 (×5): 5 [IU] via SUBCUTANEOUS
  Filled 2015-11-06 (×3): qty 5
  Filled 2015-11-06: qty 10
  Filled 2015-11-06 (×3): qty 5

## 2015-11-06 MED ORDER — FUROSEMIDE 10 MG/ML IJ SOLN
40.0000 mg | Freq: Two times a day (BID) | INTRAMUSCULAR | Status: DC
  Administered 2015-11-07 – 2015-11-08 (×3): 40 mg via INTRAVENOUS
  Filled 2015-11-06 (×3): qty 4

## 2015-11-06 MED ORDER — SODIUM CHLORIDE 0.9% FLUSH
3.0000 mL | Freq: Two times a day (BID) | INTRAVENOUS | Status: DC
  Administered 2015-11-06 – 2015-11-08 (×5): 3 mL via INTRAVENOUS

## 2015-11-06 MED ORDER — DOCUSATE SODIUM 100 MG PO CAPS
100.0000 mg | ORAL_CAPSULE | Freq: Two times a day (BID) | ORAL | Status: DC
  Administered 2015-11-06 – 2015-11-09 (×6): 100 mg via ORAL
  Filled 2015-11-06 (×6): qty 1

## 2015-11-06 MED ORDER — IPRATROPIUM-ALBUTEROL 0.5-2.5 (3) MG/3ML IN SOLN
3.0000 mL | Freq: Four times a day (QID) | RESPIRATORY_TRACT | Status: DC | PRN
  Administered 2015-11-07 – 2015-11-09 (×5): 3 mL via RESPIRATORY_TRACT
  Filled 2015-11-06 (×5): qty 3

## 2015-11-06 MED ORDER — ALBUTEROL SULFATE (2.5 MG/3ML) 0.083% IN NEBU
5.0000 mg | INHALATION_SOLUTION | Freq: Once | RESPIRATORY_TRACT | Status: AC
  Administered 2015-11-06: 5 mg via RESPIRATORY_TRACT
  Filled 2015-11-06: qty 6

## 2015-11-06 MED ORDER — NITROGLYCERIN 0.4 MG SL SUBL: 0.4000 mg | SUBLINGUAL_TABLET | SUBLINGUAL | Status: DC | PRN

## 2015-11-06 MED ORDER — RIVAROXABAN 20 MG PO TABS
20.0000 mg | ORAL_TABLET | Freq: Every day | ORAL | Status: DC
  Administered 2015-11-07: 20 mg via ORAL
  Filled 2015-11-06: qty 1

## 2015-11-06 MED ORDER — CARVEDILOL 6.25 MG PO TABS
6.2500 mg | ORAL_TABLET | Freq: Two times a day (BID) | ORAL | Status: DC
  Administered 2015-11-07 – 2015-11-09 (×5): 6.25 mg via ORAL
  Filled 2015-11-06 (×5): qty 1

## 2015-11-06 MED ORDER — FUROSEMIDE 10 MG/ML IJ SOLN
40.0000 mg | Freq: Once | INTRAMUSCULAR | Status: AC
  Administered 2015-11-06: 40 mg via INTRAVENOUS
  Filled 2015-11-06: qty 4

## 2015-11-06 MED ORDER — OXYCODONE-ACETAMINOPHEN 5-325 MG PO TABS
0.5000 | ORAL_TABLET | Freq: Every day | ORAL | Status: DC
  Administered 2015-11-07 – 2015-11-08 (×2): 0.5 via ORAL
  Filled 2015-11-06 (×2): qty 1

## 2015-11-06 MED ORDER — SODIUM CHLORIDE 0.9% FLUSH
3.0000 mL | INTRAVENOUS | Status: DC | PRN
Start: 2015-11-06 — End: 2015-11-09

## 2015-11-06 MED ORDER — INSULIN GLARGINE 100 UNIT/ML ~~LOC~~ SOLN
25.0000 [IU] | Freq: Every day | SUBCUTANEOUS | Status: DC
  Administered 2015-11-07: 25 [IU] via SUBCUTANEOUS
  Filled 2015-11-06 (×2): qty 0.25

## 2015-11-06 MED ORDER — GABAPENTIN 300 MG PO CAPS
300.0000 mg | ORAL_CAPSULE | Freq: Two times a day (BID) | ORAL | Status: DC
  Administered 2015-11-07 – 2015-11-09 (×5): 300 mg via ORAL
  Filled 2015-11-06 (×7): qty 1

## 2015-11-06 MED ORDER — POTASSIUM CHLORIDE CRYS ER 10 MEQ PO TBCR
10.0000 meq | EXTENDED_RELEASE_TABLET | Freq: Every day | ORAL | Status: DC
  Administered 2015-11-07 – 2015-11-09 (×3): 10 meq via ORAL
  Filled 2015-11-06 (×3): qty 1

## 2015-11-06 MED ORDER — GABAPENTIN 300 MG PO CAPS
600.0000 mg | ORAL_CAPSULE | Freq: Every day | ORAL | Status: DC
  Administered 2015-11-06 – 2015-11-08 (×3): 600 mg via ORAL
  Filled 2015-11-06 (×2): qty 2

## 2015-11-06 MED ORDER — SODIUM CHLORIDE 0.9 % IV SOLN
250.0000 mL | INTRAVENOUS | Status: DC | PRN
Start: 2015-11-06 — End: 2015-11-09

## 2015-11-06 NOTE — ED Notes (Signed)
[facility] since [facility]. Pt with audible expiratory wheezing. HR 52 afib, sats 99% 2lnc.

## 2015-11-06 NOTE — ED Notes (Signed)
Dr. Letitia LibraJohnston notified of elevated troponin.

## 2015-11-06 NOTE — H&P (Signed)
Martin [name] is an 82 y.o. male.   Chief Complaint: Shortness of breath HPI: Two day history of worsening shortness of breath and wheezing. No chest pain. Has not changed meds and diet is controlled at care home.  Past Medical History  Diagnosis Date  . Coronary artery disease   . CHF (congestive heart failure) (HCC)     ischemic cardiomyopathy- EF 25%  . Hyperlipidemia   . Diabetes mellitus without complication ([place])   . Atrial fibrillation ([place])   . BPH (benign prostatic hyperplasia)   . Peripheral vascular disease ([place])   . Chronic kidney disease   . Insomnia   . Renal insufficiency   . Asthma     Past Surgical History  Procedure Laterality Date  . Cardiac surgery    . Coronary artery bypass graft      Family History  Problem Relation Age of Onset  . Diabetes Other    Social History:  reports that he has never smoked. He has never used smokeless tobacco. He reports that he does not drink alcohol or use illicit drugs.  Allergies: No Known Allergies   (Not in a hospital admission)  Results for orders placed or performed during the hospital encounter of 11/06/15 (from the past 48 hour(s))  Lactic acid, plasma     Status: None   Collection Time: 11/06/15  6:47 PM  Result Value Ref Range   Lactic Acid, Venous 1.6 0.5 - 1.9 mmol/L  CBC with Differential     Status: Abnormal   Collection Time: 11/06/15  6:47 PM  Result Value Ref Range   WBC 10.1 3.8 - 10.6 K/uL   RBC 4.03 (L) 4.40 - 5.90 MIL/uL   Hemoglobin 12.0 (L) 13.0 - 18.0 g/dL   HCT 36.2 (L) 40.0 - 52.0 %   MCV 89.9 80.0 - 100.0 fL   MCH 29.9 26.0 - 34.0 pg   MCHC 33.3 32.0 - 36.0 g/dL   RDW 13.3 11.5 - 14.5 %   Platelets 256 150 - 440 K/uL   Neutrophils Relative % 74 %   Neutro Abs 7.5 (H) 1.4 - 6.5 K/uL   Lymphocytes Relative 13 %   Lymphs Abs 1.3 1.0 - 3.6 K/uL   Monocytes Relative 7 %   Monocytes Absolute 0.7 0.2 - 1.0 K/uL   Eosinophils Relative 5 %   Eosinophils Absolute 0.5 0 - 0.7 K/uL    Basophils Relative 1 %   Basophils Absolute 0.1 0 - 0.1 K/uL  Brain natriuretic peptide     Status: Abnormal   Collection Time: 11/06/15  6:47 PM  Result Value Ref Range   B Natriuretic Peptide 551.0 (H) 0.0 - 100.0 pg/mL  Basic metabolic panel     Status: Abnormal   Collection Time: 11/06/15  6:47 PM  Result Value Ref Range   Sodium 137 135 - 145 mmol/L   Potassium 3.6 3.5 - 5.1 mmol/L   Chloride 99 (L) 101 - 111 mmol/L   CO2 29 22 - 32 mmol/L   Glucose, Bld 251 (H) 65 - 99 mg/dL   BUN 21 (H) 6 - 20 mg/dL   Creatinine, Ser 1.20 0.61 - 1.24 mg/dL   Calcium 8.8 (L) 8.9 - 10.3 mg/dL   GFR calc non Af Amer 51 (L) >60 mL/min   GFR calc Af Amer 59 (L) >60 mL/min    Comment: (NOTE) The eGFR has been calculated using the CKD EPI equation. This calculation has not been validated in all clinical situations.  eGFR's persistently <60 mL/min signify possible Chronic Kidney Disease.    Anion gap 9 5 - 15  Troponin I     Status: Abnormal   Collection Time: 11/06/15  6:48 PM  Result Value Ref Range   Troponin I 0.04 (HH) <0.03 ng/mL    Comment: CRITICAL RESULT CALLED TO, READ BACK BY AND VERIFIED WITH  IRIS GUIDRY AT 2109 11/06/15 SDR    Dg Chest Port 1 View  11/06/2015  CLINICAL DATA:  Shortness of Breath EXAM: PORTABLE CHEST 1 VIEW COMPARISON:  08/26/2015 FINDINGS: Moderate cardiac enlargement. Previous median sternotomy and CABG procedure. Aortic atherosclerosis. No pleural effusion or edema identified. Pulmonary vascular congestion is identified. IMPRESSION: 1. Cardiac enlargement and pulmonary vascular congestion. Electronically Signed   By: Kerby Moors M.D.   On: 11/06/2015 18:54    Review of Systems  Constitutional: Negative for fever and chills.  HENT: Positive for hearing loss.   Eyes: Negative for blurred vision.  Respiratory: Positive for shortness of breath and wheezing.   Cardiovascular: Negative for chest pain.  Gastrointestinal: Negative for nausea and vomiting.   Genitourinary: Negative for dysuria.  Musculoskeletal: Negative for joint pain.  Skin: Negative for rash.  Neurological: Negative for sensory change.    Blood pressure 160/84, pulse 55, temperature 98.8 F (37.1 C), temperature source Oral, resp. rate 26, height 6' (1.829 m), weight 110.224 kg (243 lb), SpO2 98 %. Physical Exam  Constitutional: He is oriented to person, place, and time. He appears well-developed and well-nourished.  HENT:  Head: Normocephalic and atraumatic.  Mouth/Throat: Oropharynx is clear and moist. No oropharyngeal exudate.  Hard of hearing  Eyes: EOM are normal. Pupils are equal, round, and reactive to light. No scleral icterus.  Neck: Neck supple. No JVD present. No tracheal deviation present. No thyromegaly present.  Cardiovascular:  Murmur heard. Irregular  Respiratory: He has wheezes. He exhibits no tenderness.  No dullness to percusion No use of accessary muscles.  GI: Soft. Bowel sounds are normal. He exhibits no distension and no mass.  Musculoskeletal: Normal range of motion. He exhibits no edema or tenderness.  Lymphadenopathy:    He has no cervical adenopathy.  Neurological: He is alert and oriented to person, place, and time. No cranial nerve deficit. Coordination normal.  Skin: Skin is warm. No rash noted. No erythema.     Assessment/Plan 1. Acute Respiratory failure: Likely combination of CHF and COPD exacerbation. Oxygen support and treat underlying causes.  2. CHF: Will start IV lasix and continue heart failure meds.  3. COPD Exacerbation: Nebs and steriods.  4. Atrial Fib: Rate controlled. On anticoagulation.  Time spent= 45 min  Baxter Hire, MD 11/06/2015, 9:13 PM

## 2015-11-06 NOTE — ED Provider Notes (Signed)
[facility] Emergency Department Provider Note  ____________________________________________   I have reviewed the triage vital signs and the nursing notes.   HISTORY  Chief Complaint Shortness of Breath    HPI Martin [name] is a 82 y.o. male history of CHF, and repeated pneumonias presents today with wheeze and cough. Started feeling bad yesterday. Has had no fever that he knows of. Denies productive cough. Really complains of wheeze. History is somewhat limited as patient is here by himself, he is very hard of hearing. He does seem to be alert and oriented. He received duo nebs 2 from EMS for wheeze, he was apparently hypoxic for them prior to the demonstration of oxygen. Not on oxygen at baseline. No further history is available at this time.     Past Medical History  Diagnosis Date  . Coronary artery disease   . CHF (congestive heart failure) (HCC)     ischemic cardiomyopathy- EF 25%  . Hyperlipidemia   . Diabetes mellitus without complication (HCC)   . Atrial fibrillation (HCC)   . BPH (benign prostatic hyperplasia)   . Peripheral vascular disease (HCC)   . Chronic kidney disease   . Insomnia   . Renal insufficiency   . Asthma     Patient Active Problem List   Diagnosis Date Noted  . Acute on chronic systolic CHF (congestive heart failure) (HCC) 08/26/2015  . HCAP (healthcare-associated pneumonia) 06/20/2015  . Sepsis (HCC) 03/14/2015  . Diabetes mellitus type 2 in obese (HCC) 10/01/2014  . Chronic atrial fibrillation (HCC) 10/01/2014  . Chronic kidney disease, stage III (moderate) 10/01/2014  . Chronic systolic (congestive) heart failure (HCC) 10/01/2014  . Peripheral vascular disease (HCC) 10/01/2014  . Coronary artery disease 10/01/2014    Past Surgical History  Procedure Laterality Date  . Cardiac surgery    . Coronary artery bypass graft      Current Outpatient Rx  Name  Route  Sig  Dispense  Refill  . albuterol (PROVENTIL  HFA;VENTOLIN HFA) 108 (90 BASE) MCG/ACT inhaler   Inhalation   Inhale 2 puffs into the lungs every 6 (six) hours as needed for wheezing or shortness of breath.   1 Inhaler   2   . aspirin 325 MG tablet   Oral   Take 325 mg by mouth daily.          . carvedilol (COREG) 6.25 MG tablet   Oral   Take 1 tablet (6.25 mg total) by mouth 2 (two) times daily with a meal.   60 tablet   1   . dextrose (GLUTOSE) 40 % GEL   Oral   Take 1 Tube by mouth as needed for low blood sugar.         . docusate sodium (COLACE) 100 MG capsule   Oral   Take 100 mg by mouth 2 (two) times daily.         . furosemide (LASIX) 40 MG tablet   Oral   Take 1 tablet (40 mg total) by mouth daily.   30 tablet   1   . gabapentin (NEURONTIN) 300 MG capsule   Oral   Take 300-600 mg by mouth 3 (three) times daily. Take 1 capsule (300 mg) at 0900 & 1400 and 2 capsules (600 mg) at 2000.         Marland Kitchen glucose blood test strip   Other   1 each by Other route 3 (three) times daily before meals. Use as instructed         .  guaiFENesin-dextromethorphan (ROBITUSSIN DM) 100-10 MG/5ML syrup   Oral   Take 15 mLs by mouth every 4 (four) hours as needed for cough.         . insulin aspart (NOVOLOG FLEXPEN) 100 UNIT/ML FlexPen   Subcutaneous   Inject 5 Units into the skin 3 (three) times daily with meals.   15 mL   11   . Insulin Glargine (LANTUS SOLOSTAR) 100 UNIT/ML Solostar Pen   Subcutaneous   Inject 25 Units into the skin daily.   15 mL   11   . Insulin Pen Needle (NOVOFINE) 32G X 6 MM MISC   Does not apply   1 Device by Does not apply route 3 (three) times daily with meals. To use with Novolog FlexPen         . ipratropium-albuterol (DUONEB) 0.5-2.5 (3) MG/3ML SOLN   Nebulization   Take 3 mLs by nebulization every 6 (six) hours as needed (shortness of breath).         . nitroGLYCERIN (NITROSTAT) 0.4 MG SL tablet   Sublingual   Place 1 tablet (0.4 mg total) under the tongue every 5 (five)  minutes as needed for chest pain.   30 tablet   1   . oxyCODONE-acetaminophen (PERCOCET/ROXICET) 5-325 MG tablet   Oral   Take 0.5 tablets by mouth at bedtime. Pt also takes one-half tablet every six hours as needed for moderate/severe pain.   30 tablet   0   . polyethylene glycol (MIRALAX / GLYCOLAX) packet   Oral   Take 17 g by mouth every Monday, Wednesday, and Friday.          . pravastatin (PRAVACHOL) 20 MG tablet   Oral   Take 20 mg by mouth at bedtime.          . predniSONE (DELTASONE) 10 MG tablet      Label  & dispense according to the schedule below. 5 Pills PO for 1 day then, 4 Pills PO for 1 day, 3 Pills PO for 1 day, 2 Pills PO for 1 day, 1 Pill PO for 1 days then STOP.   15 tablet   0   . rivaroxaban (XARELTO) 20 MG TABS tablet   Oral   Take 20 mg by mouth daily.         Marland Kitchen. terazosin (HYTRIN) 2 MG capsule   Oral   Take 2 mg by mouth at bedtime.          . triazolam (HALCION) 0.25 MG tablet   Oral   Take 0.5 mg by mouth at bedtime.          . vitamin B-12 (CYANOCOBALAMIN) 500 MCG tablet   Oral   Take 500 mcg by mouth daily.           Allergies Review of patient's allergies indicates no known allergies.  Family History  Problem Relation Age of Onset  . Diabetes Other     Social History Social History  Substance Use Topics  . Smoking status: Never Smoker   . Smokeless tobacco: Never Used  . Alcohol Use: No    Review of Systems Constitutional: No fever/chills Eyes: No visual changes. ENT: No sore throat. No stiff neck no neck pain Cardiovascular: Denies chest pain. Respiratory: Positive shortness of breath. Gastrointestinal:   no vomiting.  No diarrhea.  No constipation. Genitourinary: Negative for dysuria. Musculoskeletal: Negative lower extremity swelling Skin: Negative for rash. Neurological: Negative for headaches, focal weakness or numbness. 10-point ROS otherwise  negative.  ____________________________________________   PHYSICAL EXAM:  VITAL SIGNS: ED Triage Vitals  Enc Vitals Group     BP 11/06/15 1826 131/71 mmHg     Pulse Rate 11/06/15 1826 54     Resp --      Temp 11/06/15 1826 98.8 F (37.1 C)     Temp Source 11/06/15 1826 Oral     SpO2 11/06/15 1826 98 %     Weight 11/06/15 1826 243 lb (110.224 kg)     Height 11/06/15 1826 6' (1.829 m)     Head Cir --      Peak Flow --      Pain Score --      Pain Loc --      Pain Edu? --      Excl. in GC? --     Constitutional: Alert and oriented. Well appearing and in no acute distress. Eyes: Conjunctivae are normal. PERRL. EOMI. Head: Atraumatic. Nose: No congestion/rhinnorhea. Mouth/Throat: Mucous membranes are moist.  Oropharynx non-erythematous. Neck: No stridor.   Nontender with no meningismus Cardiovascular: Normal rate, regular rhythm. Grossly normal heart sounds.  Good peripheral circulation. Respiratory: Diffuse wheeze noted, no Rales or rhonchi, diminished in the bases, no significant accessory muscle use Abdominal: Soft and nontender. No distention. No guarding no rebound Back:  There is no focal tenderness or step off there is no midline tenderness there are no lesions noted. there is no CVA tenderness Musculoskeletal: No lower extremity tenderness. No joint effusions, no DVT signs strong distal pulses chronic-appearing symmetric and 2+ bilateral lower extremity edema Neurologic:  Normal speech and language. No gross focal neurologic deficits are appreciated.  Skin:  Skin is warm, dry and intact. No rash noted. Psychiatric: Mood and affect are normal. Speech and behavior are normal.  ____________________________________________   LABS (all labs ordered are listed, but only abnormal results are displayed)  Labs Reviewed  CULTURE, BLOOD (ROUTINE X 2)  CULTURE, BLOOD (ROUTINE X 2)  LACTIC ACID, PLASMA  LACTIC ACID, PLASMA  CBC WITH DIFFERENTIAL/PLATELET  BRAIN NATRIURETIC  PEPTIDE  BASIC METABOLIC PANEL   ____________________________________________  EKG  I personally interpreted any EKGs ordered by me or triage Rate controlled atrial fibrillation rate 58 bpm no acute ST elevation or acute, left bundle-branch block configuration noted. Normal axis. ____________________________________________  RADIOLOGY  I reviewed any imaging ordered by me or triage that were performed during my shift and, if possible, patient and/or family made aware of any abnormal findings. ____________________________________________   PROCEDURES  Procedure(s) performed: None  Critical Care performed: None  ____________________________________________   INITIAL IMPRESSION / ASSESSMENT AND PLAN / ED COURSE  Pertinent labs & imaging results that were available during my care of the patient were reviewed by me and considered in my medical decision making (see chart for details).  Patient with cough and wheeze, history of pneumonia. Also history of CHF. Vital signs are reassuring thus far. We'll give him a albuterol treatment. We will give him Solu-Medrol.  We will hold off on IV fluids as there is a chance this is a cardiogenic wheeze pending workup. He is afebrile, and has a history of both pneumonia as well as CHF. Does not appear to be septic. Patient will require admission however. ____________________________________________   FINAL CLINICAL IMPRESSION(S) / ED DIAGNOSES  Final diagnoses:  Wheeze      This chart was dictated using voice recognition software.  Despite best efforts to proofread,  errors can occur which can change meaning.  Jeanmarie PlantJames A Shasha Buchbinder, MD 11/06/15 [phone]

## 2015-11-06 NOTE — ED Notes (Signed)
Pt placed back on 2LNC from nebulizer treatment.  Pt continues to have audible wheezing, but appears to be in NAD.

## 2015-11-07 LAB — CBC
HCT: 35.4 % — ABNORMAL LOW (ref 40.0–52.0)
Hemoglobin: 11.9 g/dL — ABNORMAL LOW (ref 13.0–18.0)
MCH: 30 pg (ref 26.0–34.0)
MCHC: 33.7 g/dL (ref 32.0–36.0)
MCV: 88.9 fL (ref 80.0–100.0)
PLATELETS: 256 10*3/uL (ref 150–440)
RBC: 3.98 MIL/uL — AB (ref 4.40–5.90)
RDW: 13.5 % (ref 11.5–14.5)
WBC: 12.9 10*3/uL — AB (ref 3.8–10.6)

## 2015-11-07 LAB — GLUCOSE, CAPILLARY
GLUCOSE-CAPILLARY: 412 mg/dL — AB (ref 65–99)
GLUCOSE-CAPILLARY: 432 mg/dL — AB (ref 65–99)
GLUCOSE-CAPILLARY: 329 mg/dL — AB (ref 65–99)
Glucose-Capillary: 340 mg/dL — ABNORMAL HIGH (ref 65–99)
Glucose-Capillary: 307 mg/dL — ABNORMAL HIGH (ref 65–99)

## 2015-11-07 LAB — URINALYSIS COMPLETE WITH MICROSCOPIC (ARMC ONLY)
BILIRUBIN URINE: NEGATIVE
Ketones, ur: NEGATIVE mg/dL
LEUKOCYTES UA: NEGATIVE
Nitrite: NEGATIVE
PH: 6 (ref 5.0–8.0)
Protein, ur: 30 mg/dL — AB
SQUAMOUS EPITHELIAL / LPF: NONE SEEN
Specific Gravity, Urine: 1.015 (ref 1.005–1.030)

## 2015-11-07 LAB — TROPONIN I
Troponin I: 0.03 ng/mL (ref <0.03)
Troponin I: <0.03 ng/mL (ref <0.03)

## 2015-11-07 MED ORDER — INSULIN ASPART 100 UNIT/ML ~~LOC~~ SOLN
0.0000 [IU] | Freq: Three times a day (TID) | SUBCUTANEOUS | Status: DC
  Administered 2015-11-07: 3 [IU] via SUBCUTANEOUS
  Filled 2015-11-07: qty 3

## 2015-11-07 MED ORDER — INSULIN GLARGINE 100 UNIT/ML ~~LOC~~ SOLN
35.0000 [IU] | Freq: Every day | SUBCUTANEOUS | Status: DC
  Administered 2015-11-08 – 2015-11-09 (×2): 35 [IU] via SUBCUTANEOUS
  Filled 2015-11-07 (×3): qty 0.35

## 2015-11-07 MED ORDER — INSULIN GLARGINE 100 UNIT/ML ~~LOC~~ SOLN: 10.0000 [IU] | Freq: Once | SUBCUTANEOUS | Status: DC

## 2015-11-07 MED ORDER — INSULIN GLARGINE 100 UNIT/ML ~~LOC~~ SOLN
8.0000 [IU] | Freq: Once | SUBCUTANEOUS | Status: AC
  Administered 2015-11-07: 8 [IU] via SUBCUTANEOUS
  Filled 2015-11-07: qty 0.08

## 2015-11-07 MED ORDER — INSULIN ASPART 100 UNIT/ML ~~LOC~~ SOLN
0.0000 [IU] | Freq: Three times a day (TID) | SUBCUTANEOUS | Status: DC
  Administered 2015-11-07: 7 [IU] via SUBCUTANEOUS
  Administered 2015-11-08: 3 [IU] via SUBCUTANEOUS
  Administered 2015-11-08: 5 [IU] via SUBCUTANEOUS
  Administered 2015-11-08: 2 [IU] via SUBCUTANEOUS
  Administered 2015-11-08: 7 [IU] via SUBCUTANEOUS
  Administered 2015-11-09: 2 [IU] via SUBCUTANEOUS
  Filled 2015-11-07 (×2): qty 2
  Filled 2015-11-07: qty 5
  Filled 2015-11-07: qty 8
  Filled 2015-11-07: qty 7
  Filled 2015-11-07: qty 5

## 2015-11-07 NOTE — Progress Notes (Signed)
FSBS 432, orders received from Dr. Elisabeth PigeonVachhani for 8 units of Lantus and 8 units of Novolog

## 2015-11-07 NOTE — Plan of Care (Signed)
Problem: Fluid Volume: Goal: Ability to maintain a balanced intake and output will improve Outcome: Progressing Strict I/Os   

## 2015-11-07 NOTE — Evaluation (Signed)
Physical Therapy Evaluation Patient Details Name: Martin [name] MRN: [id number] DOB: 16-May-1921 Today'[name] Date: 11/07/2015   History of Present Illness  Two day history of worsening shortness of breath and wheezing. No chest pain. Has not changed meds and diet is controlled at care home  Clinical Impression  Patient presents with weakness in BLE and needs min assist for transfers sit to stand with RW. Patient ambualtes 5 feet with RW and unsteady gait. Patient will benefit from skilled PT to improve with mobility and strength.     Follow Up Recommendations SNF    Equipment Recommendations  Rolling walker with 5" wheels    Recommendations for Other Services       Precautions / Restrictions Restrictions Weight Bearing Restrictions: No      Mobility  Bed Mobility Overal bed mobility: Modified Independent                Transfers Overall transfer level: Needs assistance Equipment used: Rolling walker (2 wheeled)             General transfer comment: Patient needs cues for hand placement  Ambulation/Gait Ambulation/Gait assistance: Modified independent (Device/Increase time) Ambulation Distance (Feet): 5 Feet Assistive device: Rolling walker (2 wheeled) Gait Pattern/deviations: Step-to pattern     General Gait Details: unsteady  Stairs            Wheelchair Mobility    Modified Rankin (Stroke Patients Only)       Balance Overall balance assessment: Modified Independent                                           Pertinent Vitals/Pain Pain Assessment: No/denies pain    Home Living Family/patient expects to be discharged to:: Skilled nursing facility                      Prior Function Level of Independence: Needs assistance   Gait / Transfers Assistance Needed: Independent with bed mobility, transfers, and short distance ambulating with rollator in room; uses electric w/c for longer distances  ADL'[name] / Homemaking  Assistance Needed: Assist for bathing, meals, cleaning        Hand Dominance        Extremity/Trunk Assessment   Upper Extremity Assessment: Overall WFL for tasks assessed           Lower Extremity Assessment: Generalized weakness      Cervical / Trunk Assessment: Normal  Communication   Communication: HOH  Cognition Arousal/Alertness: Awake/alert Behavior During Therapy: WFL for tasks assessed/performed Overall Cognitive Status: Within Functional Limits for tasks assessed                      General Comments      Exercises General Exercises - Lower Extremity Ankle Circles/Pumps: Strengthening;Right;Left;Seated;AROM Short Arc Quad: Strengthening;Left;Right;10 reps;AROM Long Arc Quad: AROM;10 reps;Strengthening;Left;Right Hip ABduction/ADduction: Strengthening;AROM;10 reps;Right;Left;Standing Straight Leg Raises: Strengthening;AROM;Standing;15 reps Hip Flexion/Marching: AROM;Strengthening;Right;Left;10 reps;Standing      Assessment/Plan    PT Assessment Patient needs continued PT services  PT Diagnosis Difficulty walking   PT Problem List Decreased strength;Decreased activity tolerance;Decreased balance;Decreased safety awareness  PT Treatment Interventions Gait training;Therapeutic activities;Therapeutic exercise   PT Goals (Current goals can be found in the Care Plan section) Acute Rehab PT Goals Patient Stated Goal: to walk better PT Goal Formulation: With patient Time For Goal Achievement: 11/21/15 Potential  to Achieve Goals: Good    Frequency Min 2X/week   Barriers to discharge        Co-evaluation               End of Session Equipment Utilized During Treatment: Gait belt;Oxygen Activity Tolerance: Patient limited by fatigue Patient left: in chair           Time: 0130-0200 PT Time Calculation (min) (ACUTE ONLY): 30 min   Charges:   PT Evaluation $PT Eval Low Complexity: 1 Procedure PT Treatments $Therapeutic  Exercise: 8-22 mins   PT G Codes:      Martin [name], PT, DPT  [place], Martin [name] 11/07/2015, 3:06 PM

## 2015-11-07 NOTE — Progress Notes (Signed)
Notified Dr. Anne HahnWillis of blood sugar of 307 and no order from coverage. Dr. Anne HahnWillis to order HS coverage.

## 2015-11-07 NOTE — Progress Notes (Signed)
Patient foley catheter urine is blood tinged and IV site is bleeding. Dr. Elisabeth PigeonVachhani notified stated he would put in orders.

## 2015-11-07 NOTE — Progress Notes (Addendum)
Patient was admitted from the ER following c/o worsening SOB/wheezing. On admission to the unit patient has a urinary foley catheter placed ion the ER, accompanied by family. Patient and family were oriented to the unit/room, cardiac monitor was placed and verified. Skin was team checked with another RN, patient has healing scab on bilateral knee and bruises on bilateral lower eyes.  Patient rested for most of the night has adequate urine output and stated that he feels better. Patient remained  Controlled asymptomatic afib on the monitor.

## 2015-11-07 NOTE — Progress Notes (Signed)
Patients blood sugar 412 no orders for sliding scale. Dr. Elisabeth Pigeonvachhani notified. Orders received to give 10 units of Novolog now.

## 2015-11-07 NOTE — Care Management (Signed)
Patient from [facility]. Will follow. Will need PT consult.  CSW updated

## 2015-11-07 NOTE — Progress Notes (Addendum)
[facility] but may be a candidate for SNF placement. PT evaluation ordered. CSW contacted [facility] [place] RN. She confirms that patient is from their facility and can return at discharge. She also reports that they can arrange transport for patient at discharge.   FL2/ PASRR Completed but will have to be updated at discharge to include discharge medications.   CSW looked up patient's PASRR. Patient has a ALF level PASRR if SNF placement is required then CSW will have to submit for a new PASRR number. Patient assessment to follow. CSW will continue to follow and assist.  Woodroe Modehristina Jabaree Mercado, MSW, LCSW-A, LCAS-A Clinical Social Worker [phone]

## 2015-11-07 NOTE — NC FL2 (Signed)
[place] MEDICAID FL2 LEVEL OF CARE SCREENING TOOL     IDENTIFICATION  Patient Name: Martin [name] Birthdate: 1922/01/08 Sex: male Admission Date (Current Location): 11/06/2015  [facility] [place] [facility]:  Martin [name]  ([id number]) Facility and Address:  [facility], [address], [place], [place] [address]      Provider Number: [id number]  Attending Physician Name and Address:  Altamese DillingVaibhavkumar Rikayla Demmon, MD  Relative Name and Phone Number:       Current Level of Care: Hospital Recommended Level of Care: Assisted Living Facility ([facility] [place] ) Prior Approval Number:    Date Approved/Denied:   PASRR Number:  [id number])  Discharge Plan: Domiciliary (Rest home) ([facility] [place])    Current Diagnoses: Patient Active Problem List   Diagnosis Date Noted  . Acute respiratory failure (HCC) 11/06/2015  . Acute on chronic systolic CHF (congestive heart failure) (HCC) 08/26/2015  . HCAP (healthcare-associated pneumonia) 06/20/2015  . Sepsis (HCC) 03/14/2015  . Diabetes mellitus type 2 in obese (HCC) 10/01/2014  . Chronic atrial fibrillation (HCC) 10/01/2014  . Chronic kidney disease, stage III (moderate) 10/01/2014  . Chronic systolic (congestive) heart failure (HCC) 10/01/2014  . Peripheral vascular disease (HCC) 10/01/2014  . Coronary artery disease 10/01/2014    Orientation RESPIRATION BLADDER Height & Weight     Self, Time, Situation, Place  O2 (Nasal Cannula 2 (L/min) ) Continent Weight: 241 lb 4.8 oz (109.453 kg) Height:  6' (182.9 cm)  BEHAVIORAL SYMPTOMS/MOOD NEUROLOGICAL BOWEL NUTRITION STATUS   (None)  (None) Continent Diet (heart healthy/carb modified )  AMBULATORY STATUS COMMUNICATION OF NEEDS Skin   Supervision Verbally Normal                       Personal Care Assistance Level of Assistance  Bathing, Feeding, Dressing Bathing Assistance: Limited assistance Feeding assistance: Independent Dressing Assistance:  Limited assistance     Functional Limitations Info  Sight, Hearing, Speech Sight Info: Impaired Hearing Info: Impaired Speech Info: Adequate    SPECIAL CARE FACTORS FREQUENCY                       Contractures      Additional Factors Info  Allergies, Code Status, Insulin Sliding Scale Code Status Info:  (Full Code) Allergies Info:  (No Known Allergies)   Insulin Sliding Scale Info:  (insulin glargine (LANTUS) injection 25 Units 25 Units, Subcutaneous, Daily ; insulin aspart (novoLOG) injection 5 Units 5 Units, Subcutaneous, 3 times daily with meals  )       Current Medications (11/07/2015):  This is the current hospital active medication list Current Facility-Administered Medications  Medication Dose Route Frequency Provider Last Rate Last Dose  . 0.9 %  sodium chloride infusion  250 mL Intravenous PRN Gracelyn NurseJohn D Johnston, MD      . aspirin tablet 325 mg  325 mg Oral Daily Gracelyn NurseJohn D Johnston, MD   325 mg at 11/07/15 [id number]  . carvedilol (COREG) tablet 6.25 mg  6.25 mg Oral BID WC Gracelyn NurseJohn D Johnston, MD   6.25 mg at 11/07/15 [id number]  . docusate sodium (COLACE) capsule 100 mg  100 mg Oral BID Gracelyn NurseJohn D Johnston, MD   100 mg at 11/07/15 0827  . furosemide (LASIX) injection 40 mg  40 mg Intravenous Q12H Gracelyn NurseJohn D Johnston, MD   40 mg at 11/07/15 0819  . gabapentin (NEURONTIN) capsule 300 mg  300 mg Oral BID Gracelyn NurseJohn D Johnston, MD  300 mg at 11/07/15 0827  . gabapentin (NEURONTIN) capsule 600 mg  600 mg Oral Daily Gracelyn NurseJohn D Johnston, MD   600 mg at 11/06/15 2243  . insulin aspart (novoLOG) injection 5 Units  5 Units Subcutaneous TID WC Gracelyn NurseJohn D Johnston, MD   5 Units at 11/07/15 0827  . insulin glargine (LANTUS) injection 25 Units  25 Units Subcutaneous Daily Gracelyn NurseJohn D Johnston, MD   25 Units at 11/07/15 [phone]  . ipratropium-albuterol (DUONEB) 0.5-2.5 (3) MG/3ML nebulizer solution 3 mL  3 mL Nebulization Q6H PRN Gracelyn NurseJohn D Johnston, MD      . methylPREDNISolone sodium succinate (SOLU-MEDROL) 40 mg/mL injection 40  mg  40 mg Intravenous Q24H Gracelyn NurseJohn D Johnston, MD   40 mg at 11/07/15 0830  . nitroGLYCERIN (NITROSTAT) SL tablet 0.4 mg  0.4 mg Sublingual Q5 min PRN Gracelyn NurseJohn D Johnston, MD      . oxyCODONE-acetaminophen (PERCOCET/ROXICET) 5-325 MG per tablet 0.5 tablet  0.5 tablet Oral QHS Gracelyn NurseJohn D Johnston, MD   0.5 tablet at 11/06/15 2243  . [START ON 11/08/2015] polyethylene glycol (MIRALAX / GLYCOLAX) packet 17 g  17 g Oral Q M,W,F Gracelyn NurseJohn D Johnston, MD      . potassium chloride (K-DUR,KLOR-CON) CR tablet 10 mEq  10 mEq Oral Daily Gracelyn NurseJohn D Johnston, MD   10 mEq at 11/07/15 0827  . pravastatin (PRAVACHOL) tablet 20 mg  20 mg Oral QHS Gracelyn NurseJohn D Johnston, MD   20 mg at 11/06/15 2243  . rivaroxaban (XARELTO) tablet 20 mg  20 mg Oral Daily Gracelyn NurseJohn D Johnston, MD   20 mg at 11/07/15 [id number]  . sodium chloride flush (NS) 0.9 % injection 3 mL  3 mL Intravenous Q12H Gracelyn NurseJohn D Johnston, MD   3 mL at 11/07/15 0834  . sodium chloride flush (NS) 0.9 % injection 3 mL  3 mL Intravenous PRN Gracelyn NurseJohn D Johnston, MD      . sodium chloride flush (NS) 0.9 % injection 3 mL  3 mL Intravenous Q12H Gracelyn NurseJohn D Johnston, MD   3 mL at 11/07/15 0834  . terazosin (HYTRIN) capsule 2 mg  2 mg Oral QHS Gracelyn NurseJohn D Johnston, MD   2 mg at 11/06/15 2243  . triazolam (HALCION) tablet 0.5 mg  0.5 mg Oral QHS Gracelyn NurseJohn D Johnston, MD   0.5 mg at 11/06/15 2243  . vitamin B-12 (CYANOCOBALAMIN) tablet 500 mcg  500 mcg Oral Daily Gracelyn NurseJohn D Johnston, MD   500 mcg at 11/07/15 [id number]     Discharge Medications: Please see discharge summary for a list of discharge medications.  Relevant Imaging Results:  Relevant Lab Results:   Additional Information  (SSN [id number])  Verta Ellenhristina E Sunkins, LCSW

## 2015-11-07 NOTE — Progress Notes (Signed)
[facility] - [place] at [facility]   PATIENT NAME: Martin [name]    MR#:  [id number]  DATE OF BIRTH:  1921/11/19  SUBJECTIVE:  CHIEF COMPLAINT:   Chief Complaint  Patient presents with  . Shortness of Breath     Came with SOB, Found to have CHF, On IV lasix- having good diuresis, feels better today.  REVIEW OF SYSTEMS:  CONSTITUTIONAL: No fever, fatigue or weakness.  EYES: No blurred or double vision.  EARS, NOSE, AND THROAT: No tinnitus or ear pain.  RESPIRATORY: No cough, positive for shortness of breath, no wheezing or hemoptysis.  CARDIOVASCULAR: No chest pain, orthopnea, edema.  GASTROINTESTINAL: No nausea, vomiting, diarrhea or abdominal pain.  GENITOURINARY: No dysuria, hematuria.  ENDOCRINE: No polyuria, nocturia,  HEMATOLOGY: No anemia, easy bruising or bleeding SKIN: No rash or lesion. MUSCULOSKELETAL: No joint pain or arthritis.   NEUROLOGIC: No tingling, numbness, weakness.  PSYCHIATRY: No anxiety or depression.   ROS  DRUG ALLERGIES:  No Known Allergies  VITALS:  Blood pressure 160/81, pulse 56, temperature 98.2 F (36.8 C), temperature source Oral, resp. rate 18, height 6' (1.829 m), weight 109.453 kg (241 lb 4.8 oz), SpO2 98 %.  PHYSICAL EXAMINATION:  GENERAL:  82 y.o.-year-old patient lying in the bed with no acute distress.  EYES: Pupils equal, round, reactive to light and accommodation. No scleral icterus. Extraocular muscles intact.  HEENT: Head atraumatic, normocephalic. Oropharynx and nasopharynx clear.  NECK:  Supple, no jugular venous distention. No thyroid enlargement, no tenderness.  LUNGS: Normal breath sounds bilaterally, no wheezing, some crepitation. No use of accessory muscles of respiration.  CARDIOVASCULAR: S1, S2 normal. No murmurs, rubs, or gallops.  ABDOMEN: Soft, nontender, nondistended. Bowel sounds present. No organomegaly or mass.  EXTREMITIES: mild pedal edema around ankle , no cyanosis, or clubbing.  NEUROLOGIC:  Cranial nerves II through XII are intact. Muscle strength 5/5 in all extremities. Sensation intact. Gait not checked.  PSYCHIATRIC: The patient is alert and oriented x 3.  SKIN: No obvious rash, lesion, or ulcer.   Physical Exam LABORATORY PANEL:   CBC  Recent Labs Lab 11/06/15 1847  WBC 10.1  HGB 12.0*  HCT 36.2*  PLT 256   ------------------------------------------------------------------------------------------------------------------  Chemistries   Recent Labs Lab 11/06/15 1847  NA 137  K 3.6  CL 99*  CO2 29  GLUCOSE 251*  BUN 21*  CREATININE 1.20  CALCIUM 8.8*   ------------------------------------------------------------------------------------------------------------------  Cardiac Enzymes  Recent Labs Lab 11/07/15 0348 11/07/15 0950  TROPONINI 0.03* <0.03   ------------------------------------------------------------------------------------------------------------------  RADIOLOGY:  Dg Chest Port 1 View  11/06/2015  CLINICAL DATA:  Shortness of Breath EXAM: PORTABLE CHEST 1 VIEW COMPARISON:  08/26/2015 FINDINGS: Moderate cardiac enlargement. Previous median sternotomy and CABG procedure. Aortic atherosclerosis. No pleural effusion or edema identified. Pulmonary vascular congestion is identified. IMPRESSION: 1. Cardiac enlargement and pulmonary vascular congestion. Electronically Signed   By: Signa Kellaylor  Stroud M.D.   On: 11/06/2015 18:54    ASSESSMENT AND PLAN:   Active Problems:   Acute respiratory failure (HCC)  * Ac respi failure with Ac on ch systolic CHF   IV lasix- showing good response.   Monitor I/o, and renal function.  * COPD   No exacerbation, was started on IV steroids, stop it for now.    Cont nebs and inhalers.  * A fib   On rate controlling coreg and xarelto.  * DM   Lantus, meal coverage and ISS.  * BPH   Terazosin.   As  per daughter- after removing foley- he may have trouble urinating.  All the records are reviewed and case  discussed with Care Management/Social Workerr. Management plans discussed with the patient, family and they are in agreement.  CODE STATUS: full  TOTAL TIME TAKING CARE OF THIS PATIENT: 35 minutes.   POSSIBLE D/C IN 1-2 DAYS, DEPENDING ON CLINICAL CONDITION.   Altamese DillingVACHHANI, Arlester Keehan M.D on 11/07/2015   Between 7am to 6pm - Pager - [phone]  After 6pm go to [email or web address] - password [email or web address]  [facility] [place] [facility]  Office  [phone]  CC: Primary care physician; Pcp Not In System  Note: This dictation was prepared with Dragon dictation along with smaller phrase technology. Any transcriptional errors that result from this process are unintentional.

## 2015-11-07 NOTE — Clinical Social Work Note (Signed)
Clinical Social Work Assessment  Patient Details  Name: Martin [name] MRN: [id number] Date of Birth: 08-20-1921  Date of referral:  11/07/15               Reason for consult:  Discharge Planning                Permission sought to share information with:  Family Supports Permission granted to share information::  Yes, Verbal Permission Granted  Name::        Agency::     Relationship::   Kenney Houseman- Daughter )  Contact Information:     Housing/Transportation Living arrangements for the past 2 months:  [place] ([place] ) Source of Information:  Patient Patient Interpreter Needed:  None Criminal Activity/Legal Involvement Pertinent to Current Situation/Hospitalization:  No - Comment as needed Significant Relationships:  Other Family Members, Adult Children Lives with:  Facility Resident Do you feel safe going back to the place where you live?  Yes Need for family participation in patient care:  Yes (Comment) Kenney Houseman- Daughter )  Care giving concerns:  PT recommended SNF for patient.   Social Worker assessment / plan:  CSW met with patient at bedside. Introduced herself and her role. Patient is hard at hearing. Per patient he is a resident at [place]. Reported he'd like to return at discharge. Stated he has ane electric chair he uses and fell in it last week. Stated that he already has [facility] services at [facility] and would like them to resume. Granted CSW verbal permission to contact his daughter if needed. CSW informed RNCM of above. CSW will continue to follow and assist.   Employment status:  Retired Nurse, adult PT Recommendations:  [place] / Referral to community resources:  [place]  Patient/Family's Response to care:  Patient would like to return to [facility] at discharge. Stated that he receives PT there through [facility] services.   Patient/Family's Understanding of and  Emotional Response to Diagnosis, Current Treatment, and Prognosis:  Patient understands why he was admitted into [facility] and stated that he's appreciative of CSW's assistance.   Emotional Assessment Appearance:  Appears stated age Attitude/Demeanor/Rapport:   (None) Affect (typically observed):  Calm, Pleasant, Accepting Orientation:  Oriented to Self, Oriented to Place, Oriented to  Time, Oriented to Situation Alcohol / Substance use:  Not Applicable Psych involvement (Current and /or in the community):  No (Comment)  Discharge Needs  Concerns to be addressed:  Discharge Planning Concerns Readmission within the last 30 days:  No Current discharge risk:  Chronically ill Barriers to Discharge:  Continued Medical Work up   [employer], LCSW 11/07/2015, 2:30 PM

## 2015-11-08 LAB — GLUCOSE, CAPILLARY
GLUCOSE-CAPILLARY: 208 mg/dL — AB (ref 65–99)
GLUCOSE-CAPILLARY: 256 mg/dL — AB (ref 65–99)
Glucose-Capillary: 154 mg/dL — ABNORMAL HIGH (ref 65–99)
Glucose-Capillary: 261 mg/dL — ABNORMAL HIGH (ref 65–99)

## 2015-11-08 LAB — BASIC METABOLIC PANEL
Anion gap: 8 (ref 5–15)
BUN: 35 mg/dL — AB (ref 6–20)
CHLORIDE: 99 mmol/L — AB (ref 101–111)
CO2: 32 mmol/L (ref 22–32)
CREATININE: 1.45 mg/dL — AB (ref 0.61–1.24)
Calcium: 8.4 mg/dL — ABNORMAL LOW (ref 8.9–10.3)
GFR calc Af Amer: 47 mL/min — ABNORMAL LOW (ref >60)
GFR calc non Af Amer: 41 mL/min — ABNORMAL LOW (ref >60)
GLUCOSE: 202 mg/dL — AB (ref 65–99)
POTASSIUM: 3.6 mmol/L (ref 3.5–5.1)
Sodium: 139 mmol/L (ref 135–145)

## 2015-11-08 MED ORDER — FINASTERIDE 5 MG PO TABS
5.0000 mg | ORAL_TABLET | Freq: Every day | ORAL | Status: AC
Start: 2015-11-08 — End: ?

## 2015-11-08 MED ORDER — FINASTERIDE 5 MG PO TABS
5.0000 mg | ORAL_TABLET | Freq: Every day | ORAL | Status: DC
Start: 2015-11-08 — End: 2015-11-09
  Administered 2015-11-08 – 2015-11-09 (×2): 5 mg via ORAL
  Filled 2015-11-08 (×2): qty 1

## 2015-11-08 MED ORDER — FUROSEMIDE 40 MG PO TABS
40.0000 mg | ORAL_TABLET | Freq: Every day | ORAL | Status: DC
  Administered 2015-11-08 – 2015-11-09 (×2): 40 mg via ORAL
  Filled 2015-11-08: qty 1

## 2015-11-08 MED ORDER — LISINOPRIL 5 MG PO TABS
5.0000 mg | ORAL_TABLET | Freq: Every day | ORAL | Status: DC
  Administered 2015-11-08 – 2015-11-09 (×2): 5 mg via ORAL
  Filled 2015-11-08 (×2): qty 1

## 2015-11-08 MED ORDER — RIVAROXABAN 20 MG PO TABS: 20.0000 mg | ORAL_TABLET | Freq: Every day | ORAL | Status: AC

## 2015-11-08 NOTE — Progress Notes (Signed)
CSW spoke to patient. Informed him that he will discharge tomorrow back home. Patient reports that he hasn't been able to sleep. Reported he'll stay one more night but that's it. Contacted patient's son-in-law and informed him of above. Reported he's coming to visit patient today at 6 PM. CSW will continue to follow and assist.  Woodroe Modehristina Vaudine Dutan, MSW, LCSW-A, LCAS-A Clinical Social Worker [phone]

## 2015-11-08 NOTE — Progress Notes (Signed)
[facility] - [place] at [facility]   PATIENT NAME: Martin [name]    MR#:  [id number]  DATE OF BIRTH:  [birth year]  SUBJECTIVE:  CHIEF COMPLAINT:   Chief Complaint  Patient presents with  . Shortness of Breath     Came with SOB, Found to have CHF, On IV lasix- having good diuresis, feels better today.   Had hematuria yesterday. Urine still light red in foley.   No new complains by pt.  REVIEW OF SYSTEMS:  CONSTITUTIONAL: No fever, fatigue or weakness.  EYES: No blurred or double vision.  EARS, NOSE, AND THROAT: No tinnitus or ear pain.  RESPIRATORY: No cough, positive for shortness of breath, no wheezing or hemoptysis.  CARDIOVASCULAR: No chest pain, orthopnea, edema.  GASTROINTESTINAL: No nausea, vomiting, diarrhea or abdominal pain.  GENITOURINARY: No dysuria, positive for hematuria.  ENDOCRINE: No polyuria, nocturia,  HEMATOLOGY: No anemia, easy bruising or bleeding SKIN: No rash or lesion. MUSCULOSKELETAL: No joint pain or arthritis.   NEUROLOGIC: No tingling, numbness, weakness.  PSYCHIATRY: No anxiety or depression.   ROS  DRUG ALLERGIES:  No Known Allergies  VITALS:  Blood pressure 130/74, pulse 58, temperature 97.8 F (36.6 C), temperature source Oral, resp. rate 21, height 6' (1.829 m), weight 109.453 kg (241 lb 4.8 oz), SpO2 95 %.  PHYSICAL EXAMINATION:  GENERAL:  82 y.o.-year-old patient lying in the bed with no acute distress.  EYES: Pupils equal, round, reactive to light and accommodation. No scleral icterus. Extraocular muscles intact.  HEENT: Head atraumatic, normocephalic. Oropharynx and nasopharynx clear.  NECK:  Supple, no jugular venous distention. No thyroid enlargement, no tenderness.  LUNGS: Normal breath sounds bilaterally, no wheezing, some crepitation. No use of accessory muscles of respiration.  CARDIOVASCULAR: S1, S2 normal. No murmurs, rubs, or gallops.  ABDOMEN: Soft, nontender, nondistended. Bowel sounds present. No  organomegaly or mass.  EXTREMITIES: mild pedal edema around ankle , no cyanosis, or clubbing.  NEUROLOGIC: Cranial nerves II through XII are intact. Muscle strength 5/5 in all extremities. Sensation intact. Gait not checked.  PSYCHIATRIC: The patient is alert and oriented x 3.  SKIN: No obvious rash, lesion, or ulcer.   Physical Exam LABORATORY PANEL:   CBC  Recent Labs Lab 11/07/15 1518  WBC 12.9*  HGB 11.9*  HCT 35.4*  PLT 256   ------------------------------------------------------------------------------------------------------------------  Chemistries   Recent Labs Lab 11/08/15 0423  NA 139  K 3.6  CL 99*  CO2 32  GLUCOSE 202*  BUN 35*  CREATININE 1.45*  CALCIUM 8.4*   ------------------------------------------------------------------------------------------------------------------  Cardiac Enzymes  Recent Labs Lab 11/07/15 0348 11/07/15 0950  TROPONINI 0.03* <0.03   ------------------------------------------------------------------------------------------------------------------  RADIOLOGY:  Dg Chest Port 1 View  11/06/2015  CLINICAL DATA:  Shortness of Breath EXAM: PORTABLE CHEST 1 VIEW COMPARISON:  08/26/2015 FINDINGS: Moderate cardiac enlargement. Previous median sternotomy and CABG procedure. Aortic atherosclerosis. No pleural effusion or edema identified. Pulmonary vascular congestion is identified. IMPRESSION: 1. Cardiac enlargement and pulmonary vascular congestion. Electronically Signed   By: Signa Kellaylor  [place] M.D.   On: 11/06/2015 18:54    ASSESSMENT AND PLAN:   Active Problems:   Acute respiratory failure (HCC)  * Ac respi failure with Ac on ch systolic CHF   IV lasix- showing good response. Switched to oral.   Monitor I/o, and renal function slightly worse.  * COPD   No exacerbation, was started on IV steroids, stop it for now.   Cont nebs and inhalers.  * A fib   On  rate controlling coreg and xarelto.  * DM   Lantus, meal coverage and  ISS.  * BPH   Terazosin.   As per daughter- after removing foley- he may have trouble urinating.    Added finesteride by urology.  * Hematuria   Not much now. Held his anticoagulation.   May resume after 4-5 days   D/c foley today.  All the records are reviewed and case discussed with Care Management/Social Workerr. Management plans discussed with the patient, family and they are in agreement.  CODE STATUS: full  TOTAL TIME TAKING CARE OF THIS PATIENT: 35 minutes.   POSSIBLE D/C IN 1-2 DAYS, DEPENDING ON CLINICAL CONDITION.   Altamese DillingVACHHANI, Martin [name] M.D on 11/08/2015   Between 7am to 6pm - Pager - [phone]  After 6pm go to [email or web address] - password [email or web address]  [facility] [place] [facility]  Office  [phone]  CC: Primary care physician; Pcp Not In System  Note: This dictation was prepared with Dragon dictation along with smaller phrase technology. Any transcriptional errors that result from this process are unintentional.

## 2015-11-08 NOTE — Progress Notes (Signed)
Physical Therapy Treatment Patient Details Name: Martin [name] MRN: [id number] DOB: [birth year] Today's Date: 11/08/2015    History of Present Illness Pt presents from [facility] with a two day history of worsening SOB and wheezing. Admitted for acute respiratory failure. PMH significant for CAD, CHF, hyperlipidemia, DM, AFib, PVD, CKD, asthma, and h/o CABG.    PT Comments    Pt is making good progress towards gait goal; demonstrating the ability to ambulate 6350ft with RW and close standby-min guard assist. O2 sats on room air dropped as low as 86% with activity, but quickly recovered to >92% with rest/verbal cues. RN notified.  Pt resides at ALF and per pt report, at his baseline he ambulates with rollator walker only in his room and uses the electric w/c for further distances (meals, social events, etc). Pt demonstrates good stability and appropriate DME use with RW and anticipate would be safe to d/c home with use of rollator walker and HHPT for LE strengthening and increasing ambulation tolerance.     Follow Up Recommendations  Home health PT     Equipment Recommendations   (pt has rollator walker and electric w/c)    Recommendations for Other Services       Precautions / Restrictions Precautions Precautions: Fall Restrictions Weight Bearing Restrictions: No    Mobility  Bed Mobility Overal bed mobility: Modified Independent General bed mobility comments: With [facility] elevated slightly and use of bed rails, pt assumes sitting EOB mod I.  Transfers Overall transfer level: Needs assistance Equipment used: Rolling walker (2 wheeled) Transfers: Sit to/from Stand (x 2 trials) Sit to Stand: close standby to min guard General transfer comment: Pt achieves sit <> stand x2 with RW and CGA for safety. No vc's required for hand/foot placement or DME use.   Ambulation/Gait Ambulation/Gait assistance: close standby to min guard Ambulation Distance (Feet): 50 Feet Assistive device:  Rolling walker (2 wheeled) Gait Pattern/deviations: Step-to pattern;Decreased step length - right;Decreased step length - left;Staggering left;Decreased stance time - right Gait velocity: Decreased   General Gait Details: Pt with decreased step length bilat, little to no improvement with vc's. Pt demonstrates somewhat of a limp on the R, reports that his R knee has given him trouble for years. Verbal/tactile cues for direction of RW, pt reports he feels less comfortable using this RW compared to the rollator walker he uses at baseline.   Stairs    Wheelchair Mobility    Modified Rankin (Stroke Patients Only)       Balance Overall balance assessment: Needs assistance Sitting-balance support: Bilateral upper extremity supported;Feet supported Sitting balance-Leahy Scale: Good     Standing balance support: Bilateral upper extremity supported (on RW) Standing balance-Leahy Scale: Good    Cognition Arousal/Alertness: Awake/alert Behavior During Therapy: WFL for tasks assessed/performed Overall Cognitive Status: Within Functional Limits for tasks assessed    Exercises General Exercises - Lower Extremity Ankle Circles/Pumps: AROM Quad Sets: AROM Gluteal Sets: AROM Short Arc Quad: AROM Heel Slides: AROM Hip ABduction/ADduction: AROM Straight Leg Raises: AROM  All exercises performed bilaterally x 10 reps in supine.     General Comments General comments (skin integrity, edema, etc.): Ecchymosis about R eye, pt attributes to a fall from his w/c last week.      Pertinent Vitals/Pain Pain Assessment: No/denies pain  SaO2 monitored throughout session and responded as follows (all on room air): -91% at rest -86% following supine TherEx, recovered to 95% with vc's for breathing -88% following ambulation x 8750ft, recovered  to 92% with sitting and vc's for breathing -94% after seated rest RN aware of above.             PT Goals (current goals can now be found in the care  plan section) Acute Rehab PT Goals Patient Stated Goal: to walk better PT Goal Formulation: With patient Time For Goal Achievement: 11/21/15 Potential to Achieve Goals: Good Progress towards PT goals: Progressing toward goals    Frequency  Min 2X/week    PT Plan Current plan remains appropriate       End of Session Equipment Utilized During Treatment: Gait belt Activity Tolerance: Patient tolerated treatment well Patient left: in chair;with call bell/phone within reach;with chair alarm set     Time: [id number] PT Time Calculation (min) (ACUTE ONLY): 29 min  Charges:                       G Codes:      Johnette Teigen. SPT 11/08/2015, 1:13 PM

## 2015-11-08 NOTE — Progress Notes (Signed)
SATURATION QUALIFICATIONS: (This note is used to comply with regulatory documentation for home oxygen)  Patient Saturations on Room Air at Rest = 92%  Patient Saturations on Room Air while Ambulating = 88%  Patient Saturations on 1 Liters of oxygen while Ambulating = 95%  Please briefly explain why patient needs home oxygen: 

## 2015-11-08 NOTE — Care Management Important Message (Signed)
Important Message  Patient Details  Name: Martin [name] MRN: [id number] Date of Birth: [birth year]   Medicare Important Message Given:  Yes    Olegario MessierKathy A Arleene Settle 11/08/2015, 11:16 AM

## 2015-11-08 NOTE — Discharge Summary (Signed)
[facility] - [place] at [facility]   PATIENT NAME: Martin [name]    MR#:  [id number]  DATE OF BIRTH:  Oct 04, 1921  DATE OF ADMISSION:  11/06/2015 ADMITTING PHYSICIAN: Gracelyn Nurse, MD  DATE OF DISCHARGE: 11/08/2015   PRIMARY CARE PHYSICIAN: Pcp Not In System    ADMISSION DIAGNOSIS:  [place] [R06.2] Acute on chronic congestive heart failure, unspecified congestive heart failure type (HCC) [I50.9]  DISCHARGE DIAGNOSIS:  Active Problems:   Acute respiratory failure (HCC) chf   SECONDARY DIAGNOSIS:   Past Medical History  Diagnosis Date  . Coronary artery disease   . CHF (congestive heart failure) (HCC)     ischemic cardiomyopathy- EF 25%  . Hyperlipidemia   . Diabetes mellitus without complication (HCC)   . Atrial fibrillation (HCC)   . BPH (benign prostatic hyperplasia)   . Peripheral vascular disease (HCC)   . Chronic kidney disease   . Insomnia   . Renal insufficiency   . Asthma     HOSPITAL COURSE:    Ac respi failure with Ac on ch systolic CHF  IV lasix- showing good response. Switched to oral.  Monitor I/o, and renal function slightly worse.  * COPD  No exacerbation, was started on IV steroids, stop it for now.  Cont nebs and inhalers.  * A fib  On rate controlling coreg and xarelto.  * DM  Lantus, meal coverage and ISS.  * BPH  Terazosin.  As per daughter- after removing foley- he may have trouble urinating.  Added finesteride by urology.  * Hematuria  Not much now. Held his anticoagulation.  May resume after 4-5 days  D/c foley today. DISCHARGE CONDITIONS:   stable  CONSULTS OBTAINED:  Treatment Team:  Jerilee Field, MD  DRUG ALLERGIES:  No Known Allergies  DISCHARGE MEDICATIONS:   Current Discharge Medication List    START taking these medications   Details  finasteride (PROSCAR) 5 MG tablet Take 1 tablet (5 mg total) by mouth daily. Qty: 30 tablet, Refills: 0      CONTINUE these  medications which have CHANGED   Details  rivaroxaban (XARELTO) 20 MG TABS tablet Take 1 tablet (20 mg total) by mouth daily. Qty: 30 tablet, Refills: 0      CONTINUE these medications which have NOT CHANGED   Details  albuterol (PROVENTIL HFA;VENTOLIN HFA) 108 (90 BASE) MCG/ACT inhaler Inhale 2 puffs into the lungs every 6 (six) hours as needed for wheezing or shortness of breath. Qty: 1 Inhaler, Refills: 2    aspirin 325 MG tablet Take 325 mg by mouth daily.     carvedilol (COREG) 6.25 MG tablet Take 1 tablet (6.25 mg total) by mouth 2 (two) times daily with a meal. Qty: 60 tablet, Refills: 1    dextrose (GLUTOSE) 40 % GEL Take 1 Tube by mouth as needed for low blood sugar.    docusate sodium (COLACE) 100 MG capsule Take 100 mg by mouth 2 (two) times daily.    furosemide (LASIX) 20 MG tablet Take 20 mg by mouth daily after lunch.    gabapentin (NEURONTIN) 300 MG capsule Take 300-600 mg by mouth 3 (three) times daily. Take 1 capsule (300 mg) at 0900 & 1400 and 2 capsules (600 mg) at 2000.    guaiFENesin-dextromethorphan (ROBITUSSIN DM) 100-10 MG/5ML syrup Take 15 mLs by mouth every 4 (four) hours as needed for cough.    insulin aspart (NOVOLOG FLEXPEN) 100 UNIT/ML FlexPen Inject 5 Units into the skin 3 (three)  times daily with meals. Qty: 15 mL, Refills: 11    Insulin Glargine (LANTUS SOLOSTAR) 100 UNIT/ML Solostar Pen Inject 25 Units into the skin daily. Qty: 15 mL, Refills: 11    Insulin Pen Needle (NOVOFINE) 32G X 6 MM MISC 1 Device by Does not apply route 3 (three) times daily with meals. To use with Novolog FlexPen    ipratropium-albuterol (DUONEB) 0.5-2.5 (3) MG/3ML SOLN Take 3 mLs by nebulization every 6 (six) hours as needed (shortness of breath).    nitroGLYCERIN (NITROSTAT) 0.4 MG SL tablet Place 1 tablet (0.4 mg total) under the tongue every 5 (five) minutes as needed for chest pain. Qty: 30 tablet, Refills: 1    oxyCODONE-acetaminophen (PERCOCET/ROXICET) 5-325 MG  tablet Take 0.5 tablets by mouth at bedtime. Pt also takes one-half tablet every six hours as needed for moderate/severe pain. Qty: 30 tablet, Refills: 0    polyethylene glycol (MIRALAX / GLYCOLAX) packet Take 17 g by mouth every Monday, Wednesday, and Friday.     potassium chloride (K-DUR,KLOR-CON) 10 MEQ tablet Take 10 mEq by mouth daily.    pravastatin (PRAVACHOL) 20 MG tablet Take 20 mg by mouth at bedtime.     terazosin (HYTRIN) 2 MG capsule Take 2 mg by mouth at bedtime.     triazolam (HALCION) 0.25 MG tablet Take 0.5 mg by mouth at bedtime.     vitamin B-12 (CYANOCOBALAMIN) 500 MCG tablet Take 500 mcg by mouth daily.    glucose blood test strip 1 each by Other route 3 (three) times daily before meals. Use as instructed         DISCHARGE INSTRUCTIONS:   Check renal function in 1week. Start xarelto in 4 days.  If you experience worsening of your admission symptoms, develop shortness of breath, life threatening emergency, suicidal or homicidal thoughts you must seek medical attention immediately by calling 911 or calling your MD immediately  if symptoms less severe.  You Must read complete instructions/literature along with all the possible adverse reactions/side effects for all the Medicines you take and that have been prescribed to you. Take any new Medicines after you have completely understood and accept all the possible adverse reactions/side effects.   Please note  You were cared for by a hospitalist during your hospital stay. If you have any questions about your discharge medications or the care you received while you were in the hospital after you are discharged, you can call the unit and asked to speak with the hospitalist on call if the hospitalist that took care of you is not available. Once you are discharged, your primary care physician will handle any further medical issues. Please note that NO REFILLS for any discharge medications will be authorized once you are  discharged, as it is imperative that you return to your primary care physician (or establish a relationship with a primary care physician if you do not have one) for your aftercare needs so that they can reassess your need for medications and monitor your lab values.    Today   CHIEF COMPLAINT:   Chief Complaint  Patient presents with  . Shortness of Breath    HISTORY OF PRESENT ILLNESS:  Martin [name]  is a 82 y.o. male came with sob and had chf on presentation.   VITAL SIGNS:  Blood pressure 130/74, pulse 58, temperature 97.8 F (36.6 C), temperature source Oral, resp. rate 21, height 6' (1.829 m), weight 109.453 kg (241 lb 4.8 oz), SpO2 95 %.  I/O:  Intake/Output Summary (Last 24 hours) at 11/08/15 1643 Last data filed at 11/08/15 1610  Gross per 24 hour  Intake    480 ml  Output   2450 ml  Net  -1970 ml    PHYSICAL EXAMINATION:  GENERAL: 82 y.o.-year-old patient lying in the bed with no acute distress.  EYES: Pupils equal, round, reactive to light and accommodation. No scleral icterus. Extraocular muscles intact.  HEENT: Head atraumatic, normocephalic. Oropharynx and nasopharynx clear.  NECK: Supple, no jugular venous distention. No thyroid enlargement, no tenderness.  LUNGS: Normal breath sounds bilaterally, no wheezing, some crepitation. No use of accessory muscles of respiration.  CARDIOVASCULAR: S1, S2 normal. No murmurs, rubs, or gallops.  ABDOMEN: Soft, nontender, nondistended. Bowel sounds present. No organomegaly or mass.  EXTREMITIES: mild pedal edema around ankle , no cyanosis, or clubbing.  NEUROLOGIC: Cranial nerves II through XII are intact. Muscle strength 5/5 in all extremities. Sensation intact. Gait not checked.  PSYCHIATRIC: The patient is alert and oriented x 3.  SKIN: No obvious rash, lesion, or ulcer.   DATA REVIEW:   CBC  Recent Labs Lab 11/07/15 1518  WBC 12.9*  HGB 11.9*  HCT 35.4*  PLT 256    Chemistries   Recent  Labs Lab 11/08/15 0423  NA 139  K 3.6  CL 99*  CO2 32  GLUCOSE 202*  BUN 35*  CREATININE 1.45*  CALCIUM 8.4*    Cardiac Enzymes  Recent Labs Lab 11/07/15 0950  TROPONINI <0.03    Microbiology Results  Results for orders placed or performed during the hospital encounter of 11/06/15  Culture, blood (routine x 2)     Status: None (Preliminary result)   Collection Time: 11/06/15  6:47 PM  Result Value Ref Range Status   Specimen Description BLOOD RIGHT HAND  Final   Special Requests   Final    BOTTLES DRAWN AEROBIC AND ANAEROBIC  AERO 10CC ANA 15CC   Culture NO GROWTH 2 DAYS  Final   Report Status PENDING  Incomplete  Culture, blood (routine x 2)     Status: None (Preliminary result)   Collection Time: 11/06/15  6:48 PM  Result Value Ref Range Status   Specimen Description BLOOD RIGHT ASSIST CONTROL  Final   Special Requests   Final    BOTTLES DRAWN AEROBIC AND ANAEROBIC  AERO 9CC ANA 12CC   Culture NO GROWTH 2 DAYS  Final   Report Status PENDING  Incomplete    RADIOLOGY:  Dg Chest Port 1 View  11/06/2015  CLINICAL DATA:  Shortness of Breath EXAM: PORTABLE CHEST 1 VIEW COMPARISON:  08/26/2015 FINDINGS: Moderate cardiac enlargement. Previous median sternotomy and CABG procedure. Aortic atherosclerosis. No pleural effusion or edema identified. Pulmonary vascular congestion is identified. IMPRESSION: 1. Cardiac enlargement and pulmonary vascular congestion. Electronically Signed   By: Signa Kellaylor  Stroud M.D.   On: 11/06/2015 18:54    EKG:   Orders placed or performed during the hospital encounter of 11/06/15  . ED EKG  . ED EKG      Management plans discussed with the patient, family and they are in agreement.  CODE STATUS:     Code Status Orders        Start     Ordered   11/06/15 2206  Full code   Continuous     11/06/15 2205    Code Status History    Date Active Date Inactive Code Status Order ID Comments User Context   08/27/2015 12:57 AM 08/29/2015  1:59  PM Full Code [id number]  Oralia Manis, MD Inpatient   07/28/2015  2:58 AM 07/31/2015  8:08 PM Full Code [id number]  Oralia Manis, MD Inpatient   06/20/2015 12:04 PM 06/23/2015  5:10 PM Full Code [id number]  Milagros Loll, MD ED   03/14/2015 10:19 PM 03/18/2015  5:46 PM Full Code [id number]  Wyatt Haste, MD ED   10/02/2014  3:05 AM 10/02/2014  5:53 PM Full Code [id number]  Gale Journey, MD Inpatient   09/10/2014 12:05 PM 09/12/2014  9:31 PM Full Code [id number]  Altamese Dilling, MD Inpatient      TOTAL TIME TAKING CARE OF THIS PATIENT: 35 minutes.    Altamese Dilling M.D on 11/08/2015 at 4:43 PM  Between 7am to 6pm - Pager - [phone]  After 6pm go to [email or web address] - password [email or web address]  [place] [facility]  Office  [phone]  CC: Primary care physician; Pcp Not In System   Note: This dictation was prepared with Dragon dictation along with smaller phrase technology. Any transcriptional errors that result from this process are unintentional.

## 2015-11-08 NOTE — Care Management (Signed)
Attempted to call [name] [place] to clarify which home health agency patient is active with. No answer. Will send resumption of care orders with CSW packet.

## 2015-11-08 NOTE — Progress Notes (Signed)
Inpatient Diabetes Program Recommendations  AACE/ADA: New Consensus Statement on Inpatient Glycemic Control (2015)  Target Ranges:  Prepandial:   less than 140 mg/dL      Peak postprandial:   less than 180 mg/dL (1-2 hours)      Critically ill patients:  140 - 180 mg/dL   Lab Results  Component Value Date   GLUCAP 154* 11/08/2015   HGBA1C 9.4* 08/27/2015    Review of Glycemic Control  Results for Martin [name], Martin [name] (MRN [id number]) as of 11/08/2015 09:59  Ref. Range 11/07/2015 11:59 11/07/2015 16:41 11/07/2015 21:29 11/07/2015 23:08 11/08/2015 07:18  Glucose-Capillary Latest Ref Range: 65-99 mg/dL 409412 (H) 811432 (H) 914329 (H) 307 (H) 154 (H)    Diabetes history: Type 2 Outpatient Diabetes medications: Novolog 5 units tid, Lantus 25 units qday Current orders for Inpatient glycemic control: Lantus 35 units qday, Novolog sensitive correction tid and hs, Novolog 5 units tid  Inpatient Diabetes Program Recommendations: Steroids were d/c'd yesterday and Lantus insulin was increased.  Will likely need Lantus decreased back to 25 units qday- watch trends.   Susette RacerJulie Herberth Deharo, RN, BA, MHA, CDE Diabetes Coordinator Inpatient Diabetes Program  [phone] (Team Pager) [phone] [facility] Office) 11/08/2015 10:14 AM

## 2015-11-08 NOTE — Consult Note (Signed)
Consult: gross hematuria  Requested by: Dr. Elisabeth Pigeon  Chief Complaint: gross hematuria   History of Present Illness: Martin [name] is a 82 year old white male is in hospital with CHF and COPD exacerbation undergoing Lasix diuresis. A Foley catheter was placed in the emergency department and there was some lightly bloody urine in the bag. Per the nurse the blood was not significant and in fact his Foley catheter was removed just prior to my arrival.  The patient did undergo a CT scan of the abdomen and pelvis March 2017 which revealed atrophic kidneys bilaterally, a heavily trabeculated bladder and about 100 g prostate with a median lobe. I reviewed the images. Per the notes and discussing with the patient exam psychiatric had an issue with urinary retention before which required a Foley catheter. Currently he is on terazosin.   The nurse thought the patient was pulling on the Foley catheter which made contributed to the gross hematuria but on further questioning he reports that yesterday the penis felt "hot" and as he messed with it he had a "release" which had not happened in about 11 years. He was quite pleased.  Reports he typically voids with a good stream but will have sudden bouts of urgency and urge incontinence.  Past Medical History  Diagnosis Date  . Coronary artery disease   . CHF (congestive heart failure) (HCC)     ischemic cardiomyopathy- EF 25%  . Hyperlipidemia   . Diabetes mellitus without complication (HCC)   . Atrial fibrillation (HCC)   . BPH (benign prostatic hyperplasia)   . Peripheral vascular disease (HCC)   . Chronic kidney disease   . Insomnia   . Renal insufficiency   . Asthma    Past Surgical History  Procedure Laterality Date  . Cardiac surgery    . Coronary artery bypass graft      Home Medications:  Prescriptions prior to admission  Medication Sig Dispense Refill Last Dose  . albuterol (PROVENTIL HFA;VENTOLIN HFA) 108 (90 BASE) MCG/ACT inhaler Inhale 2  puffs into the lungs every 6 (six) hours as needed for wheezing or shortness of breath. 1 Inhaler 2 prn at prn  . aspirin 325 MG tablet Take 325 mg by mouth daily.    11/06/2015 at 0900  . carvedilol (COREG) 6.25 MG tablet Take 1 tablet (6.25 mg total) by mouth 2 (two) times daily with a meal. 60 tablet 1 11/06/2015 at 0600  . dextrose (GLUTOSE) 40 % GEL Take 1 Tube by mouth as needed for low blood sugar.   prn at prn  . docusate sodium (COLACE) 100 MG capsule Take 100 mg by mouth 2 (two) times daily.   11/06/2015 at 0900  . furosemide (LASIX) 20 MG tablet Take 20 mg by mouth daily after lunch.   11/06/2015 at 1400  . furosemide (LASIX) 40 MG tablet Take 1 tablet (40 mg total) by mouth daily. 30 tablet 1 11/06/2015 at 0900  . gabapentin (NEURONTIN) 300 MG capsule Take 300-600 mg by mouth 3 (three) times daily. Take 1 capsule (300 mg) at 0900 & 1400 and 2 capsules (600 mg) at 2000.   11/06/2015 at 1400  . guaiFENesin-dextromethorphan (ROBITUSSIN DM) 100-10 MG/5ML syrup Take 15 mLs by mouth every 4 (four) hours as needed for cough.   prn at prn  . insulin aspart (NOVOLOG FLEXPEN) 100 UNIT/ML FlexPen Inject 5 Units into the skin 3 (three) times daily with meals. 15 mL 11 11/06/2015 at 1200  . Insulin Glargine (LANTUS SOLOSTAR) 100 UNIT/ML  Solostar Pen Inject 25 Units into the skin daily. 15 mL 11 11/06/2015 at 0900  . Insulin Pen Needle (NOVOFINE) 32G X 6 MM MISC 1 Device by Does not apply route 3 (three) times daily with meals. To use with Novolog FlexPen   08/26/2015 at Unknown time  . ipratropium-albuterol (DUONEB) 0.5-2.5 (3) MG/3ML SOLN Take 3 mLs by nebulization every 6 (six) hours as needed (shortness of breath).   prn at prn  . nitroGLYCERIN (NITROSTAT) 0.4 MG SL tablet Place 1 tablet (0.4 mg total) under the tongue every 5 (five) minutes as needed for chest pain. 30 tablet 1 prn at prn  . oxyCODONE-acetaminophen (PERCOCET/ROXICET) 5-325 MG tablet Take 0.5 tablets by mouth at bedtime. Pt also takes one-half tablet  every six hours as needed for moderate/severe pain. 30 tablet 0 11/05/2015 at 2100  . polyethylene glycol (MIRALAX / GLYCOLAX) packet Take 17 g by mouth every Monday, Wednesday, and Friday.    11/06/2015 at 0900  . potassium chloride (K-DUR,KLOR-CON) 10 MEQ tablet Take 10 mEq by mouth daily.   11/06/2015 at 0800  . pravastatin (PRAVACHOL) 20 MG tablet Take 20 mg by mouth at bedtime.    11/05/2015 at 2100  . rivaroxaban (XARELTO) 20 MG TABS tablet Take 20 mg by mouth daily.   11/06/2015 at 0900  . terazosin (HYTRIN) 2 MG capsule Take 2 mg by mouth at bedtime.    11/05/2015 at 2100  . triazolam (HALCION) 0.25 MG tablet Take 0.5 mg by mouth at bedtime.    11/05/2015 at 2100  . vitamin B-12 (CYANOCOBALAMIN) 500 MCG tablet Take 500 mcg by mouth daily.   11/06/2015 at 0900  . glucose blood test strip 1 each by Other route 3 (three) times daily before meals. Use as instructed   08/26/2015 at Unknown time   Allergies: No Known Allergies  Family History  Problem Relation Age of Onset  . Diabetes Other    Social History:  reports that he has never smoked. He has never used smokeless tobacco. He reports that he does not drink alcohol or use illicit drugs.  ROS: A complete review of systems was performed.  All systems are negative except for pertinent findings as noted. ROS   Physical Exam:  Vital signs in last 24 hours: Temp:  [97.8 F (36.6 C)-98.1 F (36.7 C)] 97.8 F (36.6 C) (07/05 1132) Pulse Rate:  [58-67] 58 (07/05 1132) Resp:  [20-22] 21 (07/05 1132) BP: (97-143)/(51-74) 130/74 mmHg (07/05 1132) SpO2:  [95 %-97 %] 95 % (07/05 1132) General:  Alert and oriented, No acute distress HEENT: Normocephalic, atraumatic Cardiovascular: Regular rate and rhythm Lungs: Regular rate and effort Abdomen: Soft, nontender, nondistended, no abdominal masses Back: No CVA tenderness Extremities: No edema Neurologic: Grossly intact GU: penis and foreskin appear normal, scrotum appears normal.   Laboratory Data:   Results for orders placed or performed during the hospital encounter of 11/06/15 (from the past 24 hour(s))  CBC     Status: Abnormal   Collection Time: 11/07/15  3:18 PM  Result Value Ref Range   WBC 12.9 (H) 3.8 - 10.6 K/uL   RBC 3.98 (L) 4.40 - 5.90 MIL/uL   Hemoglobin 11.9 (L) 13.0 - 18.0 g/dL   HCT 29.535.4 (L) 62.140.0 - 30.852.0 %   MCV 88.9 80.0 - 100.0 fL   MCH 30.0 26.0 - 34.0 pg   MCHC 33.7 32.0 - 36.0 g/dL   RDW 65.713.5 84.611.5 - 96.214.5 %   Platelets 256 150 - 440  K/uL  Glucose, capillary     Status: Abnormal   Collection Time: 11/07/15  4:41 PM  Result Value Ref Range   Glucose-Capillary 432 (H) 65 - 99 mg/dL   Comment 1 Notify RN   Urinalysis complete, with microscopic ([facility] only)     Status: Abnormal   Collection Time: 11/07/15  5:28 PM  Result Value Ref Range   Color, Urine YELLOW (A) YELLOW   APPearance HAZY (A) CLEAR   Glucose, UA >500 (A) NEGATIVE mg/dL   Bilirubin Urine NEGATIVE NEGATIVE   Ketones, ur NEGATIVE NEGATIVE mg/dL   Specific Gravity, Urine 1.015 1.005 - 1.030   Hgb urine dipstick 3+ (A) NEGATIVE   pH 6.0 5.0 - 8.0   Protein, ur 30 (A) NEGATIVE mg/dL   Nitrite NEGATIVE NEGATIVE   Leukocytes, UA NEGATIVE NEGATIVE   RBC / HPF TOO NUMEROUS TO COUNT 0 - 5 RBC/hpf   WBC, UA 6-30 0 - 5 WBC/hpf   Bacteria, UA RARE (A) NONE SEEN   Squamous Epithelial / LPF NONE SEEN NONE SEEN   Mucous PRESENT   Glucose, capillary     Status: Abnormal   Collection Time: 11/07/15  9:29 PM  Result Value Ref Range   Glucose-Capillary 329 (H) 65 - 99 mg/dL   Comment 1 Notify RN   Glucose, capillary     Status: Abnormal   Collection Time: 11/07/15 11:08 PM  Result Value Ref Range   Glucose-Capillary 307 (H) 65 - 99 mg/dL   Comment 1 Notify RN   Basic metabolic panel     Status: Abnormal   Collection Time: 11/08/15  4:23 AM  Result Value Ref Range   Sodium 139 135 - 145 mmol/L   Potassium 3.6 3.5 - 5.1 mmol/L   Chloride 99 (L) 101 - 111 mmol/L   CO2 32 22 - 32 mmol/L   Glucose, Bld  202 (H) 65 - 99 mg/dL   BUN 35 (H) 6 - 20 mg/dL   Creatinine, Ser 1.611.45 (H) 0.61 - 1.24 mg/dL   Calcium 8.4 (L) 8.9 - 10.3 mg/dL   GFR calc non Af Amer 41 (L) >60 mL/min   GFR calc Af Amer 47 (L) >60 mL/min   Anion gap 8 5 - 15  Glucose, capillary     Status: Abnormal   Collection Time: 11/08/15  7:18 AM  Result Value Ref Range   Glucose-Capillary 154 (H) 65 - 99 mg/dL  Glucose, capillary     Status: Abnormal   Collection Time: 11/08/15 11:31 AM  Result Value Ref Range   Glucose-Capillary 208 (H) 65 - 99 mg/dL   Recent Results (from the past 240 hour(s))  Culture, blood (routine x 2)     Status: None (Preliminary result)   Collection Time: 11/06/15  6:47 PM  Result Value Ref Range Status   Specimen Description BLOOD RIGHT HAND  Final   Special Requests   Final    BOTTLES DRAWN AEROBIC AND ANAEROBIC  AERO 10CC ANA 15CC   Culture NO GROWTH 2 DAYS  Final   Report Status PENDING  Incomplete  Culture, blood (routine x 2)     Status: None (Preliminary result)   Collection Time: 11/06/15  6:48 PM  Result Value Ref Range Status   Specimen Description BLOOD RIGHT ASSIST CONTROL  Final   Special Requests   Final    BOTTLES DRAWN AEROBIC AND ANAEROBIC  AERO 9CC ANA 12CC   Culture NO GROWTH 2 DAYS  Final   Report Status  PENDING  Incomplete   Creatinine:  Recent Labs  11/06/15 1847 11/08/15 0423  CREATININE 1.20 1.45*    Impression/Assessment/gross hematuria:  Gross hematuria-likely from BPH and Foley catheter placement. Patient may benefit from cystoscopy as an outpatient. We can see him  In the office to recheck a urine and consider cystoscopy.  BPH-patient with about 100 g prostate on CT with mild gross hematuria at this time as well as prior history of urinary retention. Also significant trabeculation of bladder on CT. In the long run he would likely benefit with adding finasteride to the terazosin. I went over the nature risk benefits and alternatives to 5 alpha reductase  inhibitors with the patient. Again he said he is typically not sexually active. We will check a PVR when pt returns in f/u.   I will sign off. Please call Urology with any questions, concerns or change in patient status.   Martin [name] 11/08/2015, 2:09 PM

## 2015-11-08 NOTE — Progress Notes (Signed)
Pt wean to room air. A fib with BBB. Pt reports no pain. HOH. A & O. Takes meds ok. Up to chair and tolerated it well. Pt worked with pt and they suggested SNF. FS are stable.  Family was updated on possible d/c 7/6.3 Pt has no further concerns at this time.

## 2015-11-09 LAB — BASIC METABOLIC PANEL
Anion gap: 7 (ref 5–15)
BUN: 40 mg/dL — AB (ref 6–20)
CHLORIDE: 100 mmol/L — AB (ref 101–111)
CO2: 33 mmol/L — ABNORMAL HIGH (ref 22–32)
CREATININE: 1.5 mg/dL — AB (ref 0.61–1.24)
Calcium: 8.4 mg/dL — ABNORMAL LOW (ref 8.9–10.3)
GFR calc Af Amer: 45 mL/min — ABNORMAL LOW (ref >60)
GFR calc non Af Amer: 39 mL/min — ABNORMAL LOW (ref >60)
GLUCOSE: 108 mg/dL — AB (ref 65–99)
POTASSIUM: 3.7 mmol/L (ref 3.5–5.1)
Sodium: 140 mmol/L (ref 135–145)

## 2015-11-09 LAB — CBC
HEMATOCRIT: 36.3 % — AB (ref 40.0–52.0)
Hemoglobin: 12.1 g/dL — ABNORMAL LOW (ref 13.0–18.0)
MCH: 30.1 pg (ref 26.0–34.0)
MCHC: 33.2 g/dL (ref 32.0–36.0)
MCV: 90.6 fL (ref 80.0–100.0)
PLATELETS: 256 10*3/uL (ref 150–440)
RBC: 4.01 MIL/uL — ABNORMAL LOW (ref 4.40–5.90)
RDW: 13.2 % (ref 11.5–14.5)
WBC: 10.9 10*3/uL — ABNORMAL HIGH (ref 3.8–10.6)

## 2015-11-09 LAB — GLUCOSE, CAPILLARY
Glucose-Capillary: 112 mg/dL — ABNORMAL HIGH (ref 65–99)
Glucose-Capillary: 200 mg/dL — ABNORMAL HIGH (ref 65–99)

## 2015-11-09 MED ORDER — FUROSEMIDE 20 MG PO TABS: 20.0000 mg | ORAL_TABLET | Freq: Two times a day (BID) | ORAL | Status: AC

## 2015-11-09 MED ORDER — IPRATROPIUM-ALBUTEROL 0.5-2.5 (3) MG/3ML IN SOLN
3.0000 mL | Freq: Once | RESPIRATORY_TRACT | Status: AC
Start: 2015-11-09 — End: 2015-11-09

## 2015-11-09 NOTE — Discharge Instructions (Signed)
DIET:  Cardiac diet  DISCHARGE CONDITION:  Stable  ACTIVITY:  Activity as tolerated  OXYGEN:  Home Oxygen: No.   Oxygen Delivery: room air  DISCHARGE LOCATION:  home    ADDITIONAL DISCHARGE INSTRUCTION: Start taking xarelto after 4 days. Check kidney function in 1 week.  If you experience worsening of your admission symptoms, develop shortness of breath, life threatening emergency, suicidal or homicidal thoughts you must seek medical attention immediately by calling 911 or calling your MD immediately  if symptoms less severe.  You Must read complete instructions/literature along with all the possible adverse reactions/side effects for all the Medicines you take and that have been prescribed to you. Take any new Medicines after you have completely understood and accpet all the possible adverse reactions/side effects.   Please note  You were cared for by a hospitalist during your hospital stay. If you have any questions about your discharge medications or the care you received while you were in the hospital after you are discharged, you can call the unit and asked to speak with the hospitalist on call if the hospitalist that took care of you is not available. Once you are discharged, your primary care physician will handle any further medical issues. Please note that NO REFILLS for any discharge medications will be authorized once you are discharged, as it is imperative that you return to your primary care physician (or establish a relationship with a primary care physician if you do not have one) for your aftercare needs so that they can reassess your need for medications and monitor your lab values.  Start taking xarelto after 4 days. Check kidney function in 1 week. Heart Failure Heart failure means your heart has trouble pumping blood. This makes it hard for your body to work well. Heart failure is usually a long-term (chronic) condition. You must take good care  of yourself and follow your doctor's treatment plan. HOME CARE  Take your heart medicine as told by your doctor.  Do not stop taking medicine unless your doctor tells you to.  Do not skip any dose of medicine.  Refill your medicines before they run out.  Take other medicines only as told by your doctor or pharmacist.  Stay active if told by your doctor. The elderly and people with severe heart failure should talk with a doctor about physical activity.  Eat heart-healthy foods. Choose foods that are without trans fat and are low in saturated fat, cholesterol, and salt (sodium). This includes fresh or frozen fruits and vegetables, fish, lean meats, fat-free or low-fat dairy foods, whole grains, and high-fiber foods. Lentils and dried peas and beans (legumes) are also good choices.  Limit salt if told by your doctor.  Cook in a healthy way. Roast, grill, broil, bake, poach, steam, or stir-fry foods.  Limit fluids as told by your doctor.  Weigh yourself every morning. Do this after you pee (urinate) and before you eat breakfast. Write down your weight to give to your doctor.  Take your blood pressure and write it down if your doctor tells you to.  Ask your doctor how to check your pulse. Check your pulse as told.  Lose weight if told by your doctor.  Stop smoking or chewing tobacco. Do not use gum or patches that help you quit without your doctor's approval.  Schedule and go to doctor visits as told.  Nonpregnant women should have no more than 1 drink a day.  Men should have no more than 2 drinks a day. Talk to your doctor about drinking alcohol.  Stop illegal drug use.  Stay current with shots (immunizations).  Manage your health conditions as told by your doctor.  Learn to manage your stress.  Rest when you are tired.  If it is really hot outside:  Avoid intense activities.  Use air conditioning or fans, or get in a cooler place.  Avoid caffeine and alcohol.  Wear  loose-fitting, lightweight, and light-colored clothing.  If it is really cold outside:  Avoid intense activities.  Layer your clothing.  Wear mittens or gloves, a hat, and a scarf when going outside.  Avoid alcohol.  Learn about heart failure and get support as needed.  Get help to maintain or improve your quality of life and your ability to care for yourself as needed. GET HELP IF:   You gain weight quickly.  You are more short of breath than usual.  You cannot do your normal activities.  You tire easily.  You cough more than normal, especially with activity.  You have any or more puffiness (swelling) in areas such as your hands, feet, ankles, or belly (abdomen).  You cannot sleep because it is hard to breathe.  You feel like your heart is beating fast (palpitations).  You get dizzy or light-headed when you stand up. GET HELP RIGHT AWAY IF:   You have trouble breathing.  There is a change in mental status, such as becoming less alert or not being able to focus.  You have chest pain or discomfort.  You faint. MAKE SURE YOU:   Understand these instructions.  Will watch your condition.  Will get help right away if you are not doing well or get worse.   This information is not intended to replace advice given to you by your health care provider. Make sure you discuss any questions you have with your health care provider.   Document Released: 01/30/2008 Document Revised: 05/13/2014 Document Reviewed: 06/08/2012 Elsevier Interactive Patient Education [employer]2016 [employer]vier Inc.

## 2015-11-09 NOTE — Progress Notes (Signed)
Patient has expiratory wheezes bilaterally breathing treatment given this morning at 8:30 and they are Q6H. Dr Allena KatzPatel notified stated to go ahead and give another treatment now

## 2015-11-09 NOTE — Progress Notes (Signed)
Patient discharged via wheelchair and private vehicle. IV removed and catheter intact. All discharge instructions given to patient and son they verbalize understanding. Tele removed and returned. No prescriptions given to patient No distress noted.

## 2015-11-09 NOTE — Progress Notes (Signed)
Clinical Social Worker was informed that patient will be medically ready to discharge to [facility] [place]. Patient and his family are in a agreement with plan. CSW called the administrator at [name] to confirm that patient can return today. Alll discharge information [facility] to [facility]. HH orders and face-to-face added to discharge packet.Call to patient's daughter. She will give CSW a call back with a pick up time. Stated her husband will get off of work to transport patient and stated that they would like patient ready at that specific time. Awaiting phone call back.   Woodroe Modehristina Preesha Benjamin, MSW, LCSW-A, LCAS-A Clinical Social Worker [phone]

## 2015-11-09 NOTE — Progress Notes (Signed)
CSW was contacted by patient's son in law. Reported he'll pick patient up at 11:30 AM. CSW informed RN. Patient will [facility] via his son-in-law.  Woodroe Modehristina Shahin Knierim, MSW, LCSW-A, LCAS-A Clinical Social Worker [phone]

## 2015-11-09 NOTE — Discharge Summary (Signed)
[facility] [place] [facility]   PATIENT NAME: Martin [name]    MR#:  [id number]  DATE OF BIRTH:  17-Apr-1922  DATE OF ADMISSION:  11/06/2015 ADMITTING PHYSICIAN: Gracelyn Nurse, MD  DATE OF DISCHARGE: 11/09/2015   PRIMARY CARE PHYSICIAN: Pcp Not In System    ADMISSION DIAGNOSIS:  Wheeze [R06.2] Acute on chronic congestive heart failure, unspecified congestive heart failure type (HCC) [I50.9]  DISCHARGE DIAGNOSIS:  Active Problems:   Acute respiratory failure (HCC) Acute on chronic systolic chf exaceberation   SECONDARY DIAGNOSIS:   Past Medical History  Diagnosis Date  . Coronary artery disease   . CHF (congestive heart failure) (HCC)     ischemic cardiomyopathy- EF 25%  . Hyperlipidemia   . Diabetes mellitus without complication (HCC)   . Atrial fibrillation (HCC)   . BPH (benign prostatic hyperplasia)   . Peripheral vascular disease (HCC)   . Chronic kidney disease   . Insomnia   . Renal insufficiency   . Asthma     HOSPITAL COURSE:    *Ac respi failure due to  Acute  on chronoc systolic CHF   Treated with IV lasix- showing good response. Switched to oral.     Now breating improved  * COPD  No exacerbation, was started on IV steroids, stop it for now.  Cont nebs and inhalers.  * A fib- hr stable  On rate controlling coreg and xarelto held for hematuria.  * DM  Lantus, meal coverage and ISS.  * BPH  Terazosin.  As per daughter- after removing foley- he may have trouble urinating.  Added finesteride by urology.  * Hematuria  Not much now. Held his anticoagulation.  May resume after 4 days  DISCHARGE CONDITIONS:   stable  CONSULTS OBTAINED:  Treatment Team:  Jerilee Field, MD  DRUG ALLERGIES:  No Known Allergies  DISCHARGE MEDICATIONS:   Discharge Medication List as of 11/09/2015 11:16 AM    START taking these medications   Details  finasteride (PROSCAR) 5 MG tablet Take 1 tablet (5 mg total) by  mouth daily., Starting 11/08/2015, Until Discontinued, Normal      CONTINUE these medications which have CHANGED   Details  furosemide (LASIX) 20 MG tablet Take 1 tablet (20 mg total) by mouth 2 (two) times daily., Starting 11/09/2015, Until Discontinued, Normal    rivaroxaban (XARELTO) 20 MG TABS tablet Take 1 tablet (20 mg total) by mouth daily., Starting 11/12/2015, Until Discontinued, Normal      CONTINUE these medications which have NOT CHANGED   Details  albuterol (PROVENTIL HFA;VENTOLIN HFA) 108 (90 BASE) MCG/ACT inhaler Inhale 2 puffs into the lungs every 6 (six) hours as needed for wheezing or shortness of breath., Starting 03/18/2015, Until Discontinued, Normal    aspirin 325 MG tablet Take 325 mg by mouth daily. , Until Discontinued, Historical Med    carvedilol (COREG) 6.25 MG tablet Take 1 tablet (6.25 mg total) by mouth 2 (two) times daily with a meal., Starting 08/29/2015, Until Discontinued, Print    dextrose (GLUTOSE) 40 % GEL Take 1 Tube by mouth as needed for low blood sugar., Until Discontinued, Historical Med    docusate sodium (COLACE) 100 MG capsule Take 100 mg by mouth 2 (two) times daily., Until Discontinued, Historical Med    gabapentin (NEURONTIN) 300 MG capsule Take 300-600 mg by mouth 3 (three) times daily. Take 1 capsule (300 mg) at 0900 & 1400 and 2 capsules (600 mg) at 2000., Until Discontinued, Historical  Med    guaiFENesin-dextromethorphan (ROBITUSSIN DM) 100-10 MG/5ML syrup Take 15 mLs by mouth every 4 (four) hours as needed for cough., Until Discontinued, Historical Med    insulin aspart (NOVOLOG FLEXPEN) 100 UNIT/ML FlexPen Inject 5 Units into the skin 3 (three) times daily with meals., Starting 08/29/2015, Until Discontinued, No Print    Insulin Glargine (LANTUS SOLOSTAR) 100 UNIT/ML Solostar Pen Inject 25 Units into the skin daily., Starting 08/29/2015, Until Discontinued, No Print    Insulin Pen Needle (NOVOFINE) 32G X 6 MM MISC 1 Device by Does not apply  route 3 (three) times daily with meals. To use with Novolog FlexPen, Until Discontinued, Historical Med    ipratropium-albuterol (DUONEB) 0.5-2.5 (3) MG/3ML SOLN Take 3 mLs by nebulization every 6 (six) hours as needed (shortness of breath)., Until Discontinued, Historical Med    nitroGLYCERIN (NITROSTAT) 0.4 MG SL tablet Place 1 tablet (0.4 mg total) under the tongue every 5 (five) minutes as needed for chest pain., Starting 09/12/2014, Until Discontinued, Normal    oxyCODONE-acetaminophen (PERCOCET/ROXICET) 5-325 MG tablet Take 0.5 tablets by mouth at bedtime. Pt also takes one-half tablet every six hours as needed for moderate/severe pain., Starting 03/18/2015, Until Discontinued, Print    polyethylene glycol (MIRALAX / GLYCOLAX) packet Take 17 g by mouth every Monday, Wednesday, and Friday. , Until Discontinued, Historical Med    potassium chloride (K-DUR,KLOR-CON) 10 MEQ tablet Take 10 mEq by mouth daily., Until Discontinued, Historical Med    pravastatin (PRAVACHOL) 20 MG tablet Take 20 mg by mouth at bedtime. , Until Discontinued, Historical Med    terazosin (HYTRIN) 2 MG capsule Take 2 mg by mouth at bedtime. , Until Discontinued, Historical Med    triazolam (HALCION) 0.25 MG tablet Take 0.5 mg by mouth at bedtime. , Until Discontinued, Historical Med    vitamin B-12 (CYANOCOBALAMIN) 500 MCG tablet Take 500 mcg by mouth daily., Until Discontinued, Historical Med    glucose blood test strip 1 each by Other route 3 (three) times daily before meals. Use as instructed, Until Discontinued, Historical Med         DISCHARGE INSTRUCTIONS:   Check renal function in 1week. Start xarelto in 4 days.  If you experience worsening of your admission symptoms, develop shortness of breath, life threatening emergency, suicidal or homicidal thoughts you must seek medical attention immediately by calling 911 or calling your MD immediately  if symptoms less severe.  You Must read complete  instructions/literature along with all the possible adverse reactions/side effects for all the Medicines you take and that have been prescribed to you. Take any new Medicines after you have completely understood and accept all the possible adverse reactions/side effects.   Please note  You were cared for by a hospitalist during your hospital stay. If you have any questions about your discharge medications or the care you received while you were in the hospital after you are discharged, you can call the unit and asked to speak with the hospitalist on call if the hospitalist that took care of you is not available. Once you are discharged, your primary care physician will handle any further medical issues. Please note that NO REFILLS for any discharge medications will be authorized once you are discharged, as it is imperative that you return to your primary care physician (or establish a relationship with a primary care physician if you do not have one) for your aftercare needs so that they can reassess your need for medications and monitor your lab values.  Today   CHIEF COMPLAINT:   Chief Complaint  Patient presents with  . Shortness of Breath    HISTORY OF PRESENT ILLNESS:  Willodean RosenthalWalter Bangs  is a 82 y.o. male came with sob and had chf on presentation. Feels better wants to go home  VITAL SIGNS:  Blood pressure 124/68, pulse 68, temperature 98.7 F (37.1 C), temperature source Oral, resp. rate 18, height 6' (1.829 m), weight 109.453 kg (241 lb 4.8 oz), SpO2 94 %.  I/O:    Intake/Output Summary (Last 24 hours) at 11/09/15 1656 Last data filed at 11/09/15 1135  Gross per 24 hour  Intake    240 ml  Output    800 ml  Net   -560 ml    PHYSICAL EXAMINATION:  GENERAL: 82 y.o.-year-old patient lying in the bed with no acute distress.  EYES: Pupils equal, round, reactive to light and accommodation. No scleral icterus. Extraocular muscles intact.  HEENT: Head atraumatic, normocephalic.  Oropharynx and nasopharynx clear.  NECK: Supple, no jugular venous distention. No thyroid enlargement, no tenderness.  LUNGS: Normal breath sounds bilaterally, no wheezing, some crepitation. No use of accessory muscles of respiration.  CARDIOVASCULAR: S1, S2 normal. No murmurs, rubs, or gallops.  ABDOMEN: Soft, nontender, nondistended. Bowel sounds present. No organomegaly or mass.  EXTREMITIES: mild pedal edema around ankle , no cyanosis, or clubbing.  NEUROLOGIC: Cranial nerves II through XII are intact. Muscle strength 5/5 in all extremities. Sensation intact. Gait not checked.  PSYCHIATRIC: The patient is alert and oriented x 3.  SKIN: No obvious rash, lesion, or ulcer.   DATA REVIEW:   CBC  Recent Labs Lab 11/09/15 0425  WBC 10.9*  HGB 12.1*  HCT 36.3*  PLT 256    Chemistries   Recent Labs Lab 11/09/15 0425  NA 140  K 3.7  CL 100*  CO2 33*  GLUCOSE 108*  BUN 40*  CREATININE 1.50*  CALCIUM 8.4*    Cardiac Enzymes  Recent Labs Lab 11/07/15 0950  TROPONINI <0.03    Microbiology Results  Results for orders placed or performed during the hospital encounter of 11/06/15  Culture, blood (routine x 2)     Status: None (Preliminary result)   Collection Time: 11/06/15  6:47 PM  Result Value Ref Range Status   Specimen Description BLOOD RIGHT HAND  Final   Special Requests   Final    BOTTLES DRAWN AEROBIC AND ANAEROBIC  AERO 10CC ANA 15CC   Culture NO GROWTH 3 DAYS  Final   Report Status PENDING  Incomplete  Culture, blood (routine x 2)     Status: None (Preliminary result)   Collection Time: 11/06/15  6:48 PM  Result Value Ref Range Status   Specimen Description BLOOD RIGHT ASSIST CONTROL  Final   Special Requests   Final    BOTTLES DRAWN AEROBIC AND ANAEROBIC  AERO 9CC ANA 12CC   Culture NO GROWTH 3 DAYS  Final   Report Status PENDING  Incomplete    RADIOLOGY:  No results found.  EKG:   Orders placed or performed during the hospital encounter  of 11/06/15  . ED EKG  . ED EKG      Management plans discussed with the patient, family and they are in agreement.  CODE STATUS:     Code Status Orders        Start     Ordered   11/06/15 2206  Full code   Continuous     11/06/15 2205  Code Status History    Date Active Date Inactive Code Status Order ID Comments User Context   08/27/2015 12:57 AM 08/29/2015  1:59 PM Full Code [id number]  Oralia Manis, MD Inpatient   07/28/2015  2:58 AM 07/31/2015  8:08 PM Full Code [id number]  Oralia Manis, MD Inpatient   06/20/2015 12:04 PM 06/23/2015  5:10 PM Full Code [id number]  Milagros Loll, MD ED   03/14/2015 10:19 PM 03/18/2015  5:46 PM Full Code [id number]  Wyatt Haste, MD ED   10/02/2014  3:05 AM 10/02/2014  5:53 PM Full Code [id number]  Gale Journey, MD Inpatient   09/10/2014 12:05 PM 09/12/2014  9:31 PM Full Code [id number]  Altamese Dilling, MD Inpatient      TOTAL TIME TAKING CARE OF THIS PATIENT: 35 minutes.    Auburn Bilberry M.D on 11/09/2015 at 4:56 PM  Between 7am to 6pm - Pager - [phone]  After 6pm go to [email or web address] - password [email or web address]  [facility]  Office  [phone]  CC: Primary care physician; Pcp Not In System   Note: This dictation was prepared with Dragon dictation along with smaller phrase technology. Any transcriptional errors that result from this process are unintentional.

## 2015-11-09 NOTE — Care Management (Signed)
Spoke with son. He is requesting a hospital bed. Patient has dyspnea at rest while lying flat. [facility]. Faxed prescription to [phone]

## 2015-11-09 NOTE — Care Management (Signed)
Discharge orders for home health SN and PT. Will send with CSW orders

## 2015-11-09 NOTE — [place] FL2 (Signed)
[place] MEDICAID FL2 LEVEL OF CARE SCREENING TOOL     IDENTIFICATION  Patient Name: Martin [name] Birthdate: February 28, 1922 Sex: male Admission Date (Current Location): 11/06/2015  [facility] [place]  [place]  ([id number]) Facility and Address:  [facility], [address], [place], [place] [address]      Provider Number: [id number]  Attending Physician Name and Address:  Auburn BilberryShreyang Patel, MD  Relative Name and Phone Number:       Current Level of Care: Hospital Recommended Level of Care: Assisted Living Facility ([facility] ) Prior Approval Number:    Date Approved/Denied:   PASRR Number:  [id number])  Discharge Plan: Domiciliary (Rest home) ([facility])    Current Diagnoses: Patient Active Problem List   Diagnosis Date Noted  . Acute respiratory failure (HCC) 11/06/2015  . Acute on chronic systolic CHF (congestive heart failure) (HCC) 08/26/2015  . HCAP (healthcare-associated pneumonia) 06/20/2015  . Sepsis (HCC) 03/14/2015  . Diabetes mellitus type 2 in obese (HCC) 10/01/2014  . Chronic atrial fibrillation (HCC) 10/01/2014  . Chronic kidney disease, stage III (moderate) 10/01/2014  . Chronic systolic (congestive) heart failure (HCC) 10/01/2014  . Peripheral vascular disease (HCC) 10/01/2014  . Coronary artery disease 10/01/2014    Orientation RESPIRATION BLADDER Height & Weight     Self, Time, Situation, Place  O2 (Nasal Cannula 2 (L/min) ) Continent Weight: 241 lb 4.8 oz (109.453 kg) Height:  6' (182.9 cm)  BEHAVIORAL SYMPTOMS/MOOD NEUROLOGICAL BOWEL NUTRITION STATUS   (None)  (None) Continent Diet (heart healthy/carb modified )  AMBULATORY STATUS COMMUNICATION OF NEEDS Skin   Supervision Verbally Normal                       Personal Care Assistance Level of Assistance  Bathing, Feeding, Dressing Bathing Assistance: Limited assistance Feeding assistance: Independent Dressing Assistance:  Limited assistance     Functional Limitations Info  Sight, Hearing, Speech Sight Info: Impaired Hearing Info: Impaired Speech Info: Adequate    SPECIAL CARE FACTORS FREQUENCY    PT (By licensed PT),        PT Frequency: (2-3)              Contractures      Additional Factors Info  Allergies, Code Status, Insulin Sliding Scale Code Status Info:  (Full Code) Allergies Info:  (No Known Allergies)   Insulin Sliding Scale Info:  (insulin glargine (LANTUS) injection 25 Units 25 Units, Subcutaneous, Daily ; insulin aspart (novoLOG) injection 5 Units 5 Units, Subcutaneous, 3 times daily with meals  )          Discharge Medications: Current Discharge Medication List    START taking these medications   Details  finasteride (PROSCAR) 5 MG tablet Take 1 tablet (5 mg total) by mouth daily. Qty: 30 tablet, Refills: 0      CONTINUE these medications which have CHANGED   Details  rivaroxaban (XARELTO) 20 MG TABS tablet Take 1 tablet (20 mg total) by mouth daily. Qty: 30 tablet, Refills: 0      CONTINUE these medications which have NOT CHANGED   Details  albuterol (PROVENTIL HFA;VENTOLIN HFA) 108 (90 BASE) MCG/ACT inhaler Inhale 2 puffs into the lungs every 6 (six) hours as needed for wheezing or shortness of breath. Qty: 1 Inhaler, Refills: 2    aspirin 325 MG tablet Take 325 mg by mouth daily.     carvedilol (COREG) 6.25 MG tablet Take 1 tablet (6.25  mg total) by mouth 2 (two) times daily with a meal. Qty: 60 tablet, Refills: 1    dextrose (GLUTOSE) 40 % GEL Take 1 Tube by mouth as needed for low blood sugar.    docusate sodium (COLACE) 100 MG capsule Take 100 mg by mouth 2 (two) times daily.    furosemide (LASIX) 20 MG tablet Take 20 mg by mouth daily after lunch.    gabapentin (NEURONTIN) 300 MG capsule Take 300-600 mg by mouth 3 (three) times daily. Take 1 capsule (300 mg) at 0900 & 1400 and 2 capsules (600 mg) at 2000.     guaiFENesin-dextromethorphan (ROBITUSSIN DM) 100-10 MG/5ML syrup Take 15 mLs by mouth every 4 (four) hours as needed for cough.    insulin aspart (NOVOLOG FLEXPEN) 100 UNIT/ML FlexPen Inject 5 Units into the skin 3 (three) times daily with meals. Qty: 15 mL, Refills: 11    Insulin Glargine (LANTUS SOLOSTAR) 100 UNIT/ML Solostar Pen Inject 25 Units into the skin daily. Qty: 15 mL, Refills: 11    Insulin Pen Needle (NOVOFINE) 32G X 6 MM MISC 1 Device by Does not apply route 3 (three) times daily with meals. To use with Novolog FlexPen    ipratropium-albuterol (DUONEB) 0.5-2.5 (3) MG/3ML SOLN Take 3 mLs by nebulization every 6 (six) hours as needed (shortness of breath).    nitroGLYCERIN (NITROSTAT) 0.4 MG SL tablet Place 1 tablet (0.4 mg total) under the tongue every 5 (five) minutes as needed for chest pain. Qty: 30 tablet, Refills: 1    oxyCODONE-acetaminophen (PERCOCET/ROXICET) 5-325 MG tablet Take 0.5 tablets by mouth at bedtime. Pt also takes one-half tablet every six hours as needed for moderate/severe pain. Qty: 30 tablet, Refills: 0    polyethylene glycol (MIRALAX / GLYCOLAX) packet Take 17 g by mouth every Monday, Wednesday, and Friday.     potassium chloride (K-DUR,KLOR-CON) 10 MEQ tablet Take 10 mEq by mouth daily.    pravastatin (PRAVACHOL) 20 MG tablet Take 20 mg by mouth at bedtime.     terazosin (HYTRIN) 2 MG capsule Take 2 mg by mouth at bedtime.     triazolam (HALCION) 0.25 MG tablet Take 0.5 mg by mouth at bedtime.     vitamin B-12 (CYANOCOBALAMIN) 500 MCG tablet Take 500 mcg by mouth daily.    glucose blood test strip 1 each by Other route 3 (three) times daily before meals. Use as instructed             Relevant Imaging Results:  Relevant Lab Results:   Additional Information  (SSN [id number])  Verta Ellenhristina E Vernetta Dizdarevic, LCSW

## 2015-11-09 NOTE — Progress Notes (Signed)
Notified by CCMD that patients heart rate is in the 30's upon assessment patient was sleeping. Dr. Allena Katzpatel notified and stated to get patient up and walking. Upon waking patient up his heart rate went up into 60's.

## 2015-11-11 LAB — CULTURE, BLOOD (ROUTINE X 2)
CULTURE: NO GROWTH
Culture: NO GROWTH

## 2016-04-13 IMAGING — CR DG CHEST 2V
1 series · 2 of 2 positions shown · non-contrast
Comparison: 03/08/2015

CLINICAL DATA: Pain in weakness all over.  Short of breath.

EXAM:
CHEST  2 VIEW

[Series 9: x chest ap · 0.14mm/px · 2 of 2 slices shown]
[im 1/2]
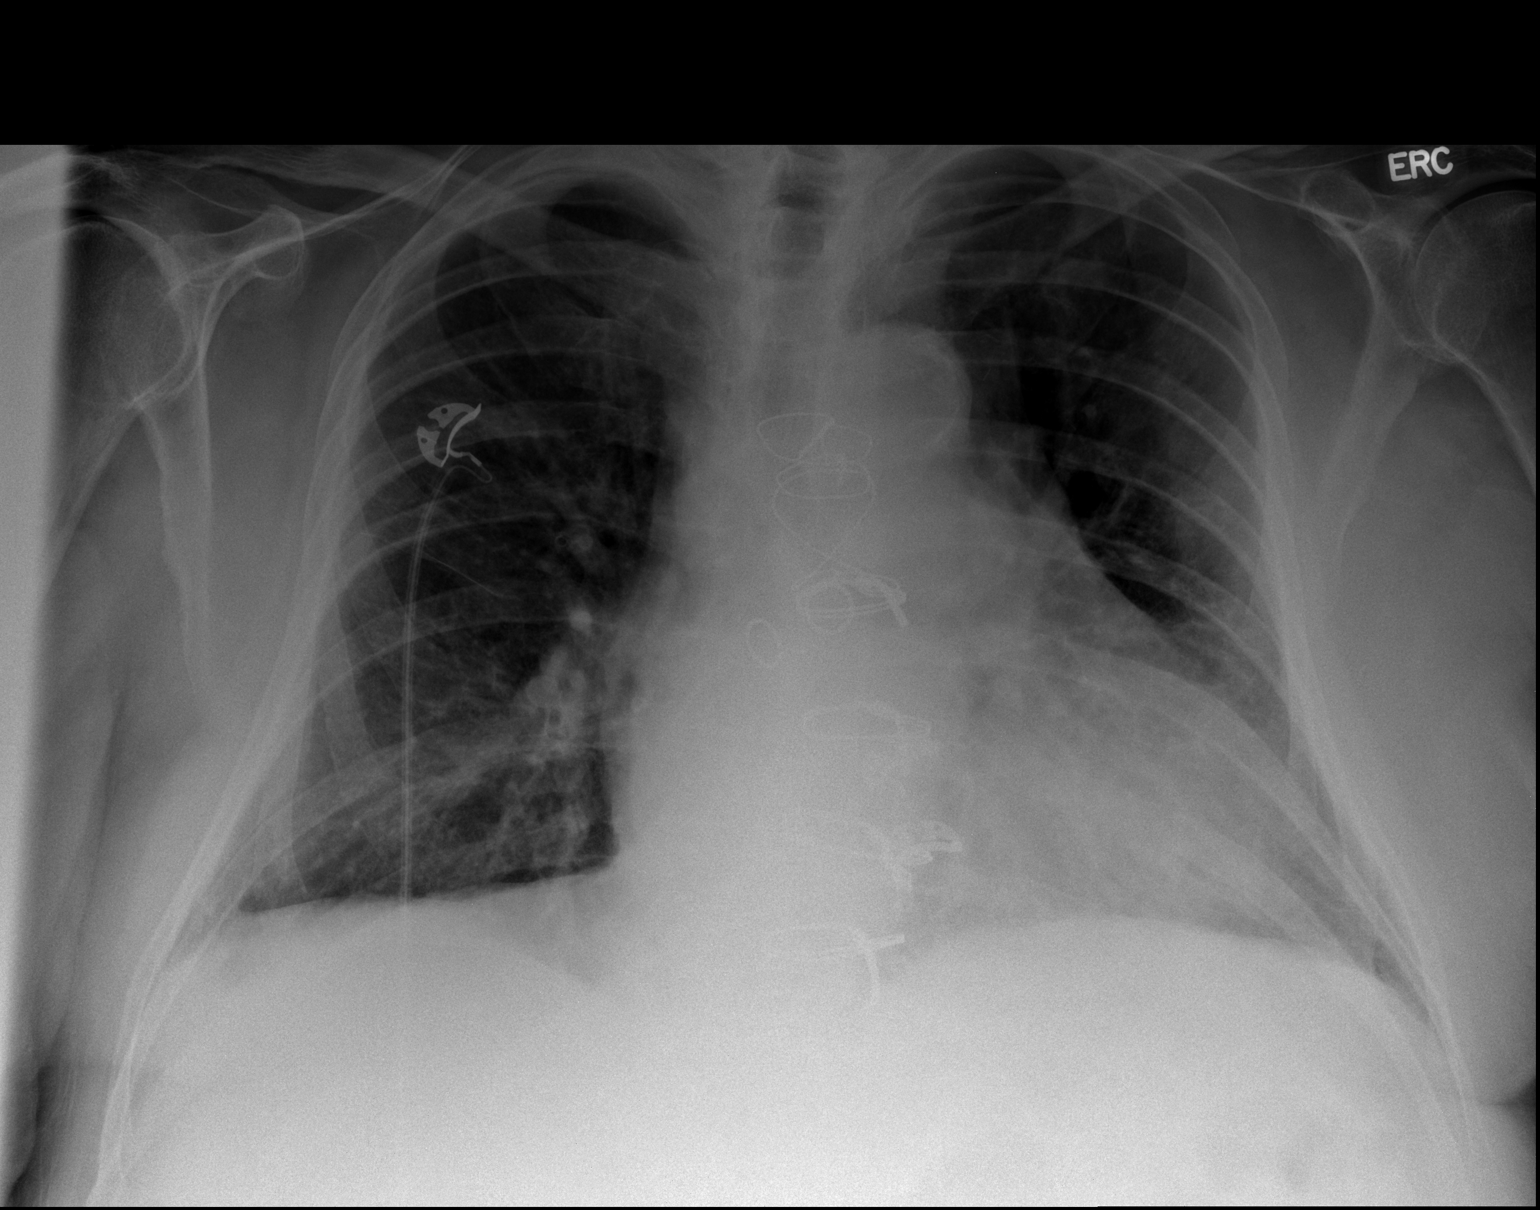
[im 2/2]
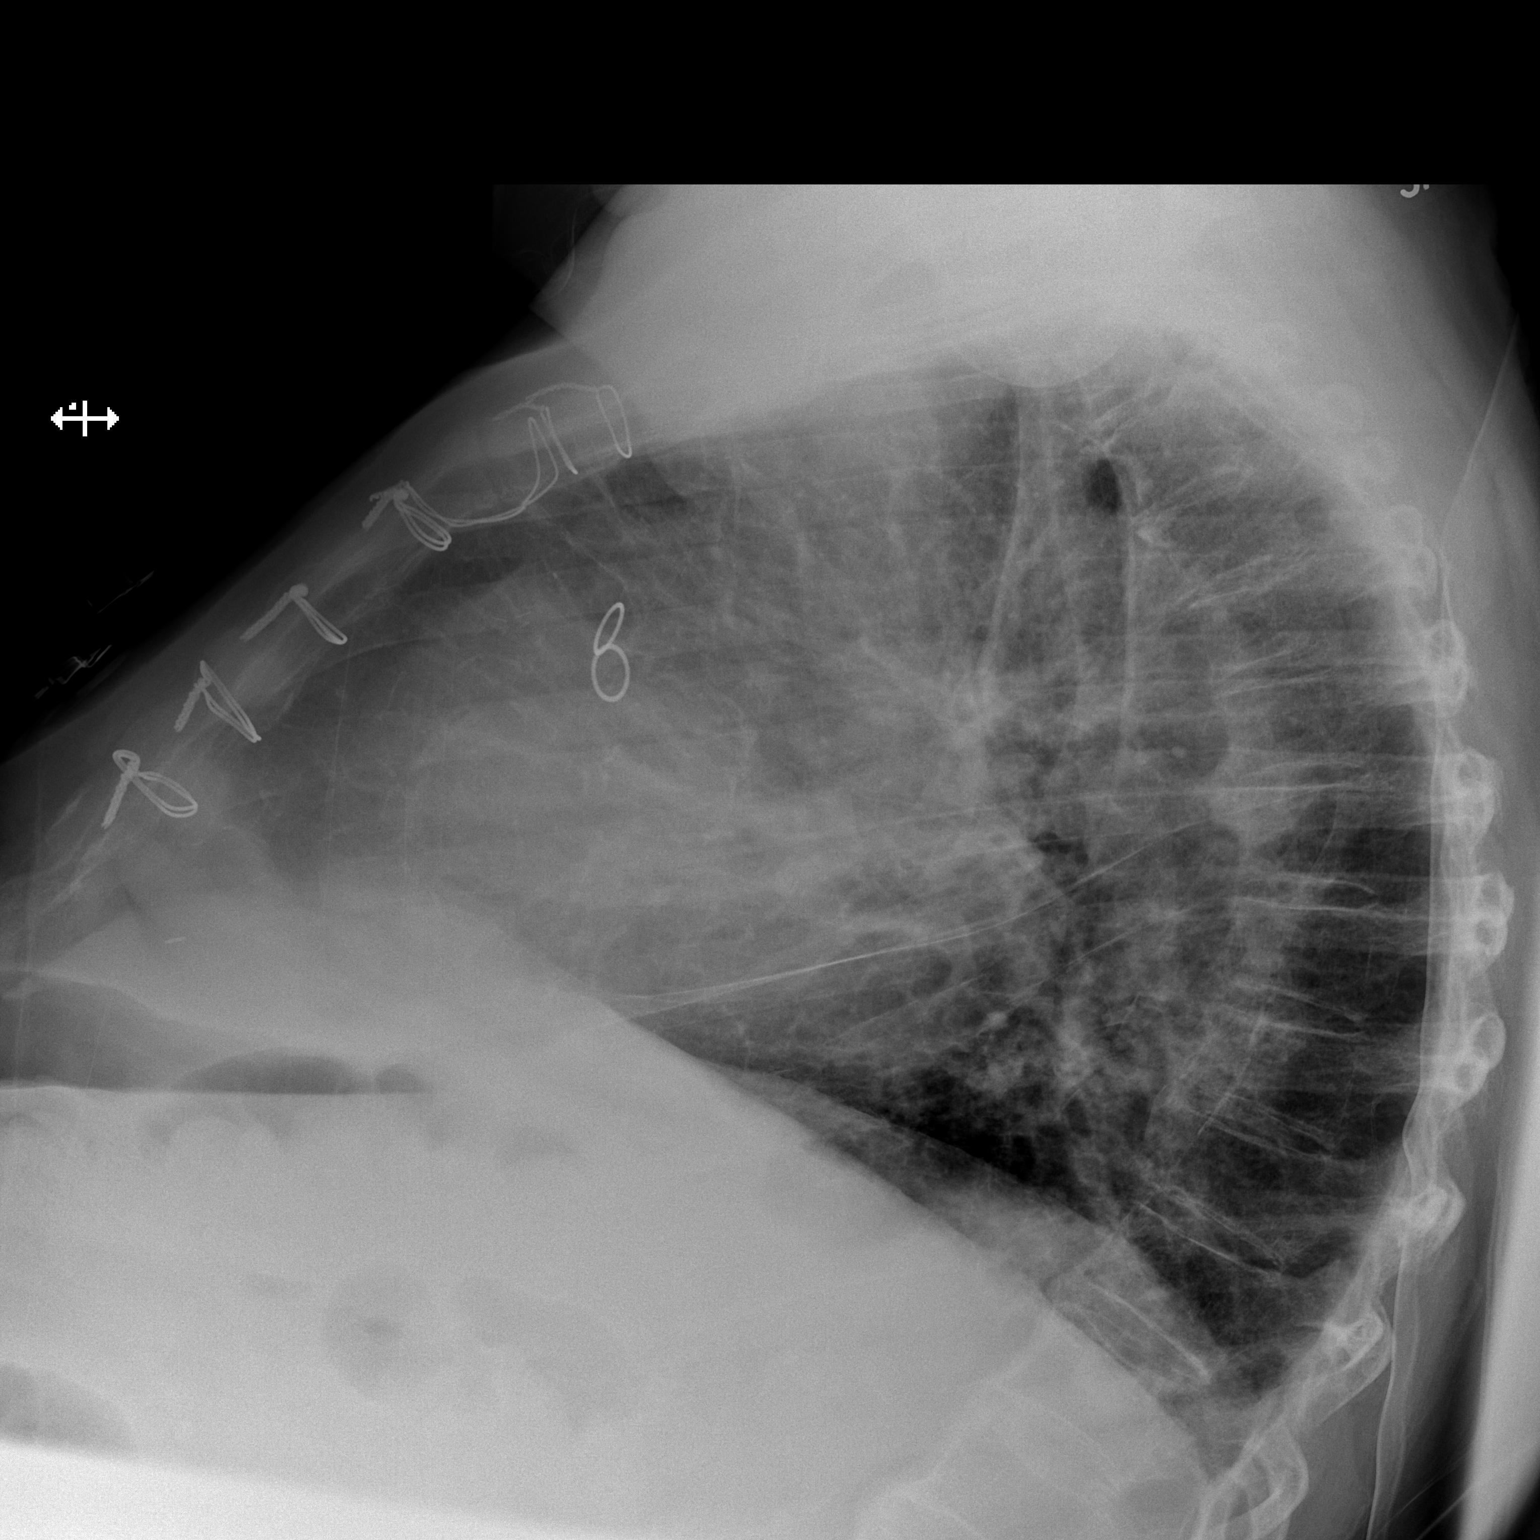

[2 of 2 positions shown; findings below may reference images not displayed]

FINDINGS: There changes from previous CABG surgery. Cardiac silhouette is
mildly enlarged. No mediastinal or hilar masses or evidence of
adenopathy.

There is mild thickening of the oblique fissures. Mild thickening is
noted along the bronchial walls of the lower lobes. These findings
stable. There is no overt pulmonary edema. No evidence of pneumonia.
No pleural effusion or pneumothorax.

Bony thorax is demineralized but grossly intact.
IMPRESSION: No acute cardiopulmonary disease.  No convincing CHF.

## 2016-05-06 DEATH — deceased

## 2016-07-14 IMAGING — US US EXTREM LOW VENOUS*R*
1 series · 13 of 24 positions shown · non-contrast
Comparison: None.

CLINICAL DATA: Acute onset of right leg swelling below the knee.
Initial encounter.



[Series 1: us extrem low venous*right* · 0.09mm/px · 13 of 35 slices shown]
[im 1/35]
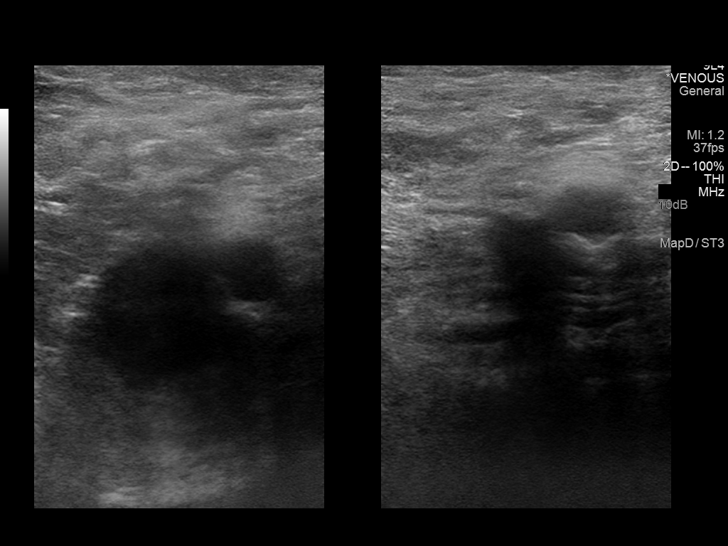
[im 3/35]
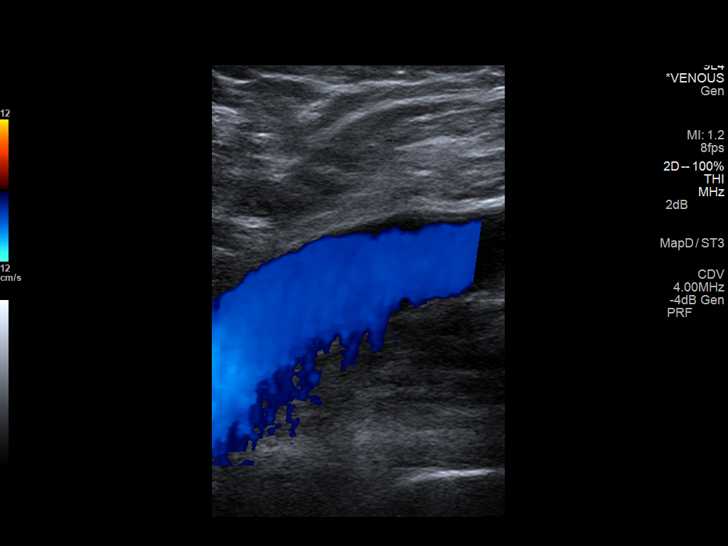
[im 6/35]
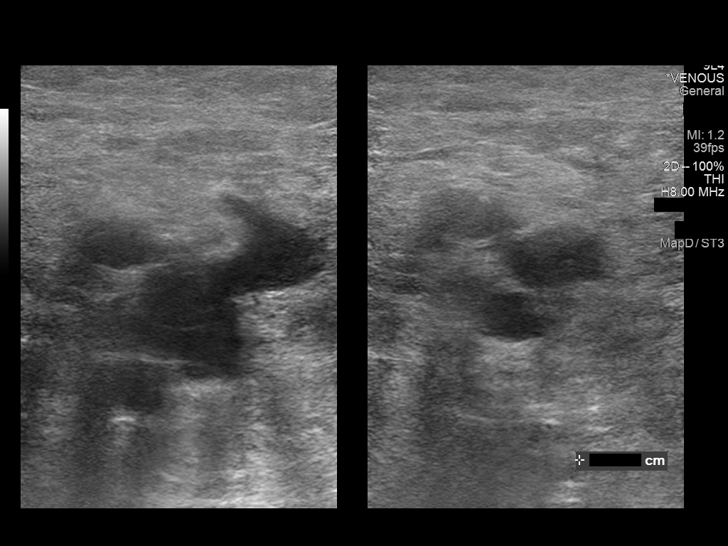
[im 9/35]
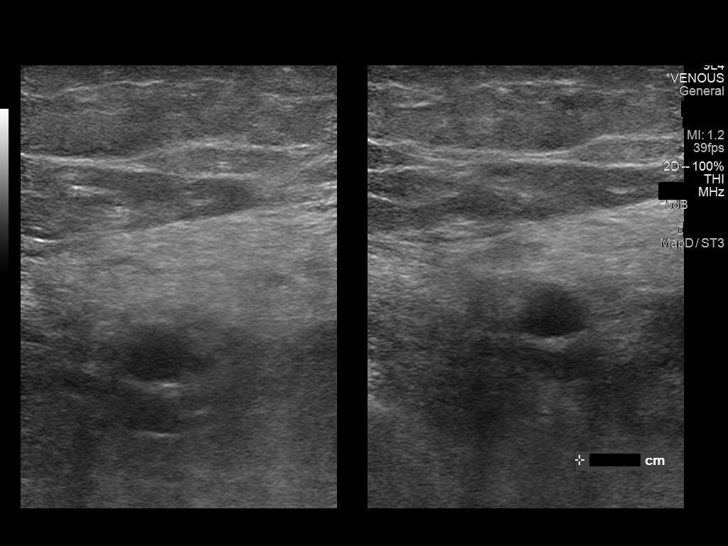
[im 12/35]
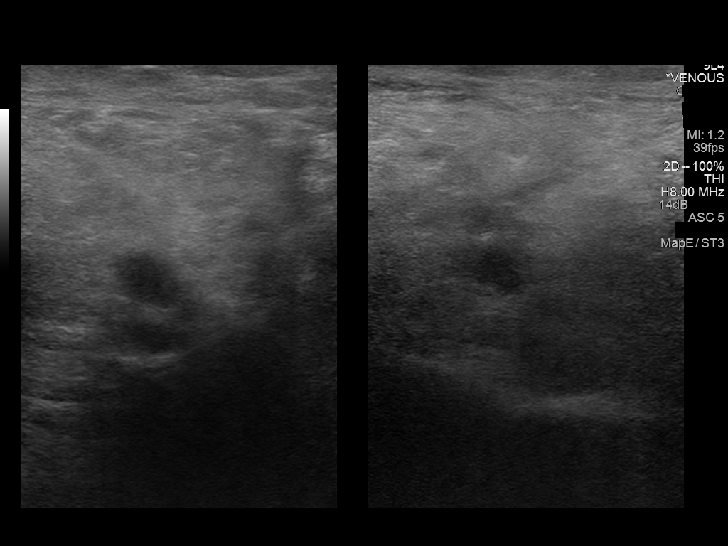
[im 15/35]
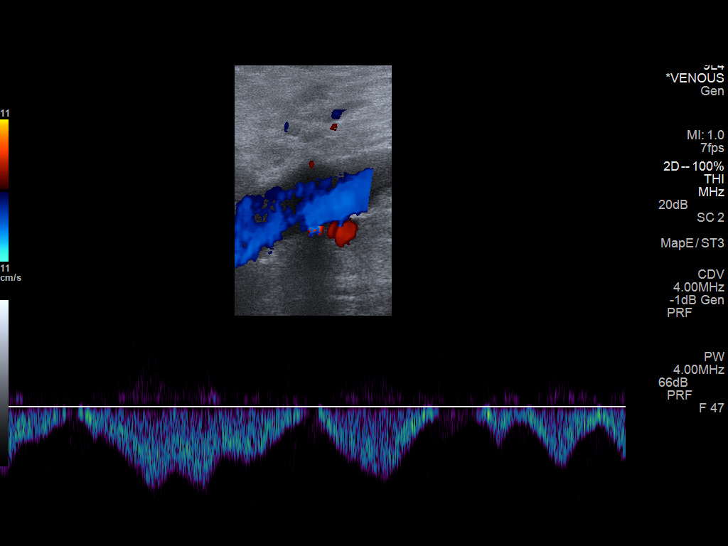
[im 18/35]
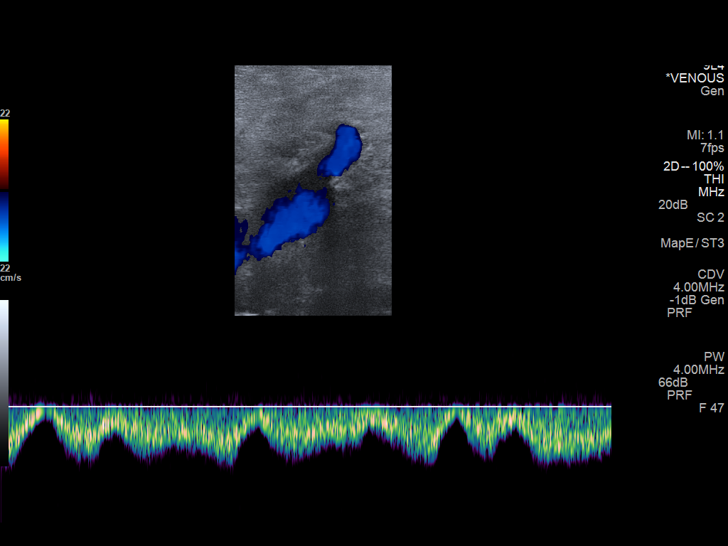
[im 20/35]
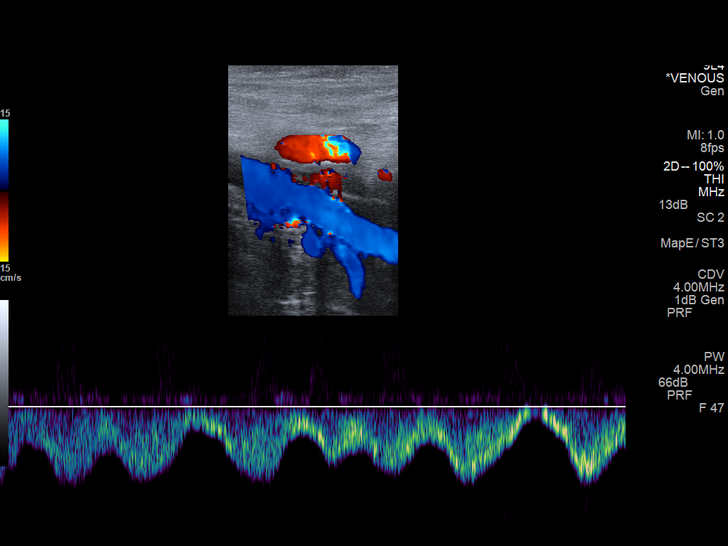
[im 23/35]
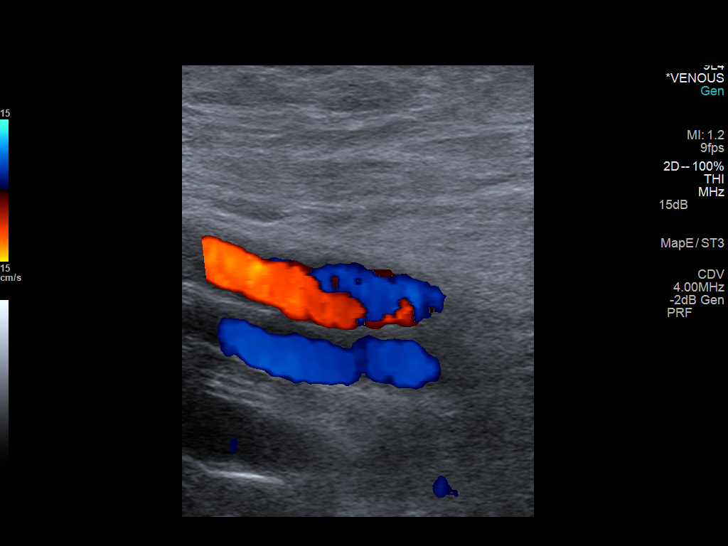
[im 26/35]
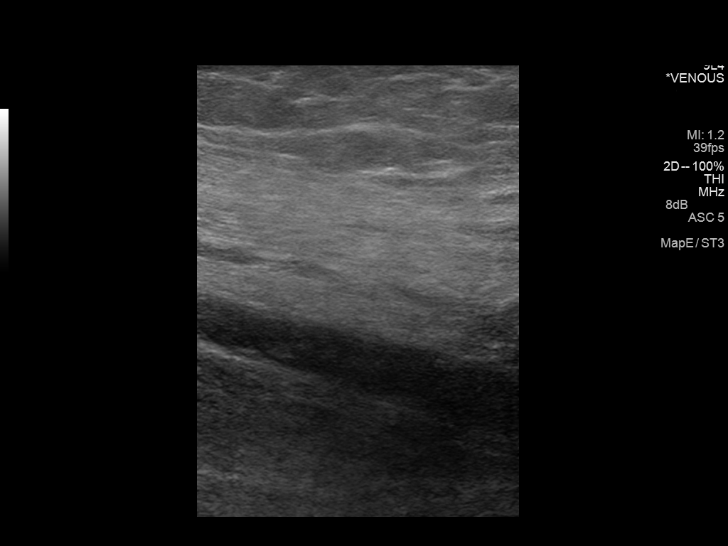
[im 29/35]
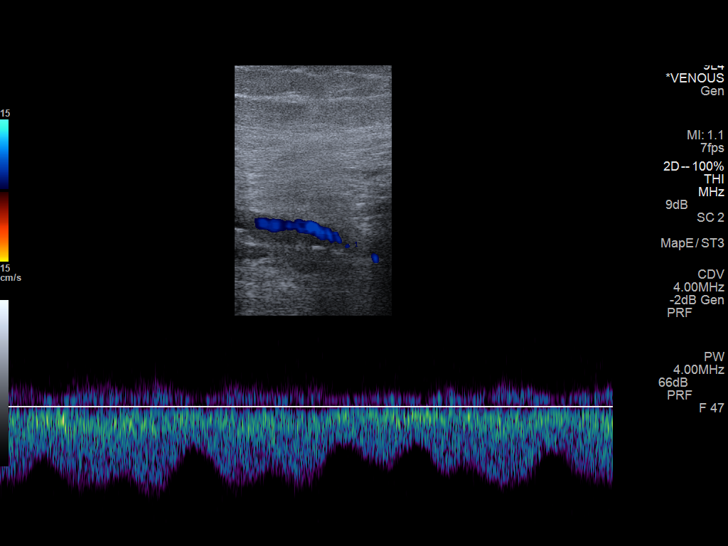
[im 32/35]
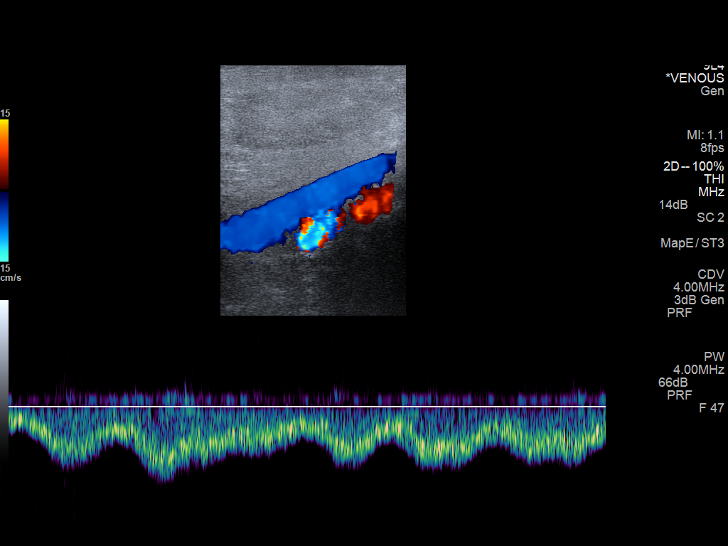
[im 35/35]
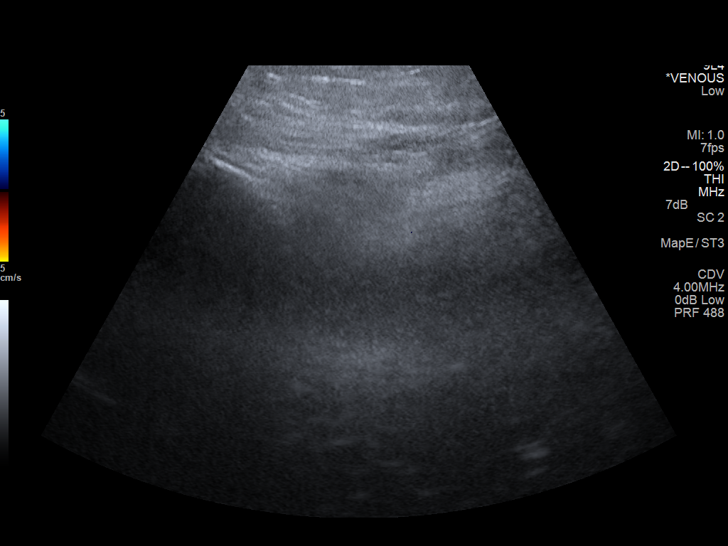

[13 of 24 positions shown; findings below may reference images not displayed]

FINDINGS: Contralateral Common Femoral Vein: Respiratory phasicity is normal
and symmetric with the symptomatic side. No evidence of thrombus.
Normal compressibility.

Common Femoral Vein: Nonocclusive thrombus is noted within the
common femoral vein. Phasicity is noted.

Saphenofemoral Junction: No evidence of thrombus. Normal
compressibility and flow on color Doppler imaging.

Profunda Femoral Vein: No evidence of thrombus. Normal
compressibility and flow on color Doppler imaging.

Femoral Vein: Nonocclusive thrombus is noted within the femoral
vein. Phasicity is noted.

Popliteal Vein: No evidence of thrombus. Normal compressibility,
respiratory phasicity and response to augmentation.

Calf Veins: The posterior tibial vein and peroneal vein are not
visualized.

Superficial Great Saphenous Vein: No evidence of thrombus. Normal
compressibility and flow on color Doppler imaging.

Venous Reflux:  None.

Other Findings:  None.
IMPRESSION: Nonocclusive deep venous thrombosis noted within the common femoral
vein and superficial femoral vein.

These results were called by telephone at the time of interpretation
on 12/10/2014 at [DATE] to Dr. INGUNN HARPA RONLOR, who verbally
acknowledged these results.

## 2016-09-29 IMAGING — CR DG CHEST 2V
2 series · 3 of 3 positions shown · non-contrast
Comparison: 07/27/2015

CLINICAL DATA: Shortness of breath, wheezing

EXAM:
CHEST  2 VIEW

[Series 2: chest lat · 0.14mm/px · 2 of 2 slices shown]
[im 1/2]
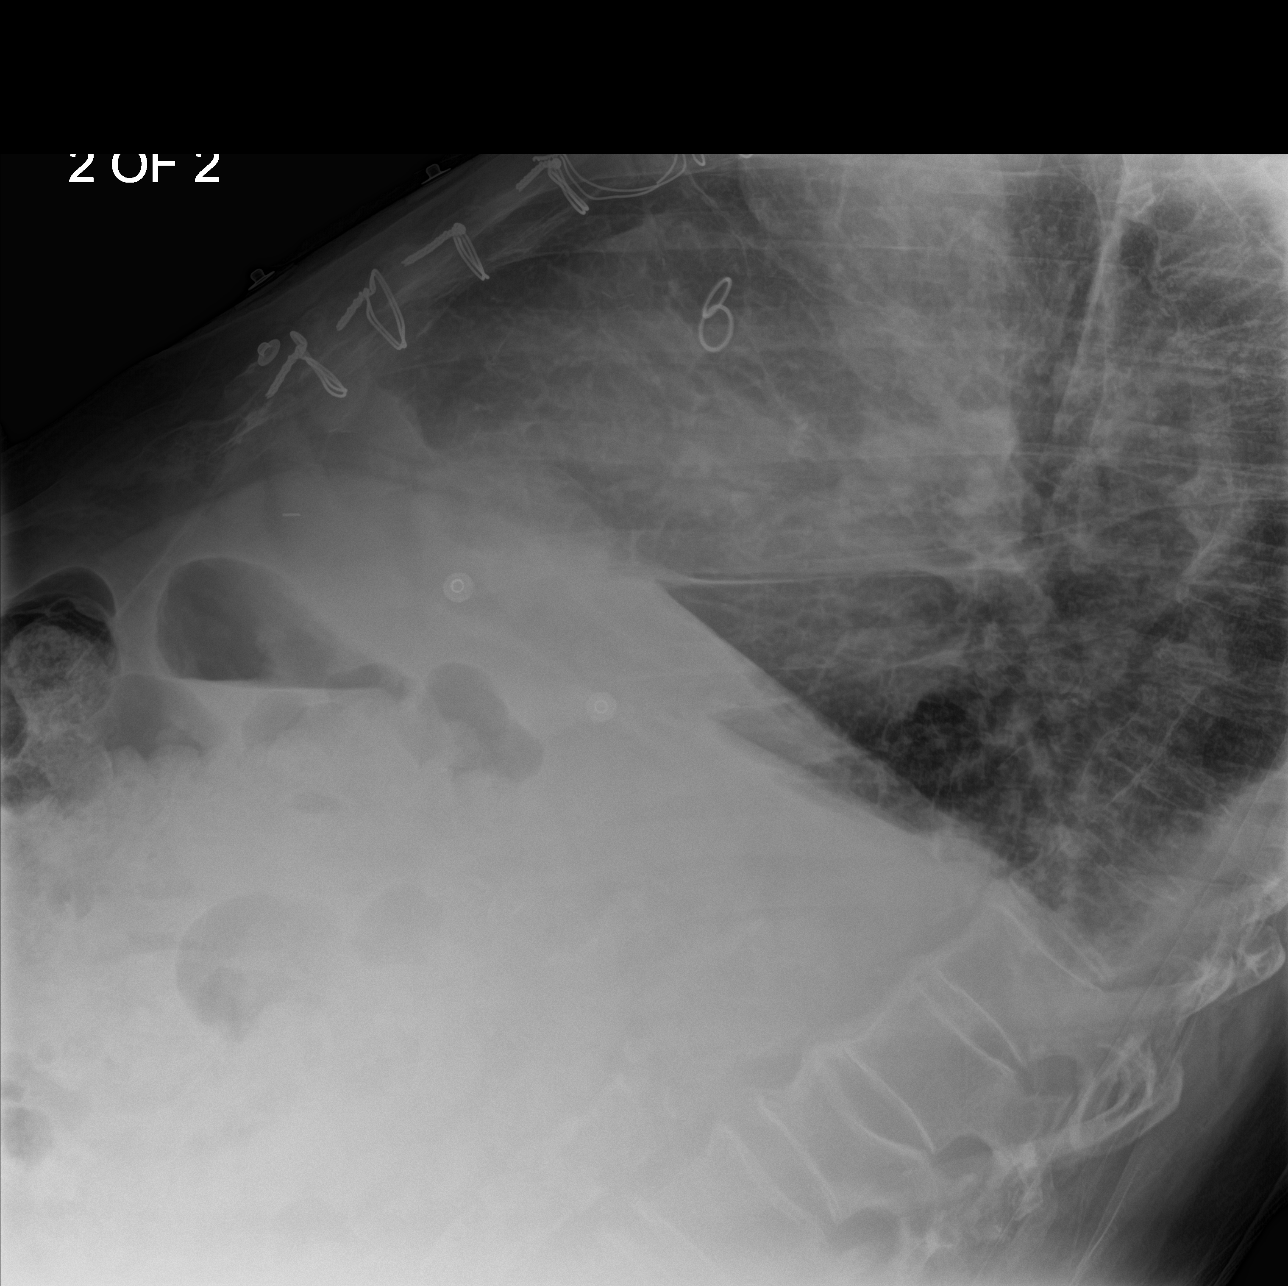
[im 2/2]
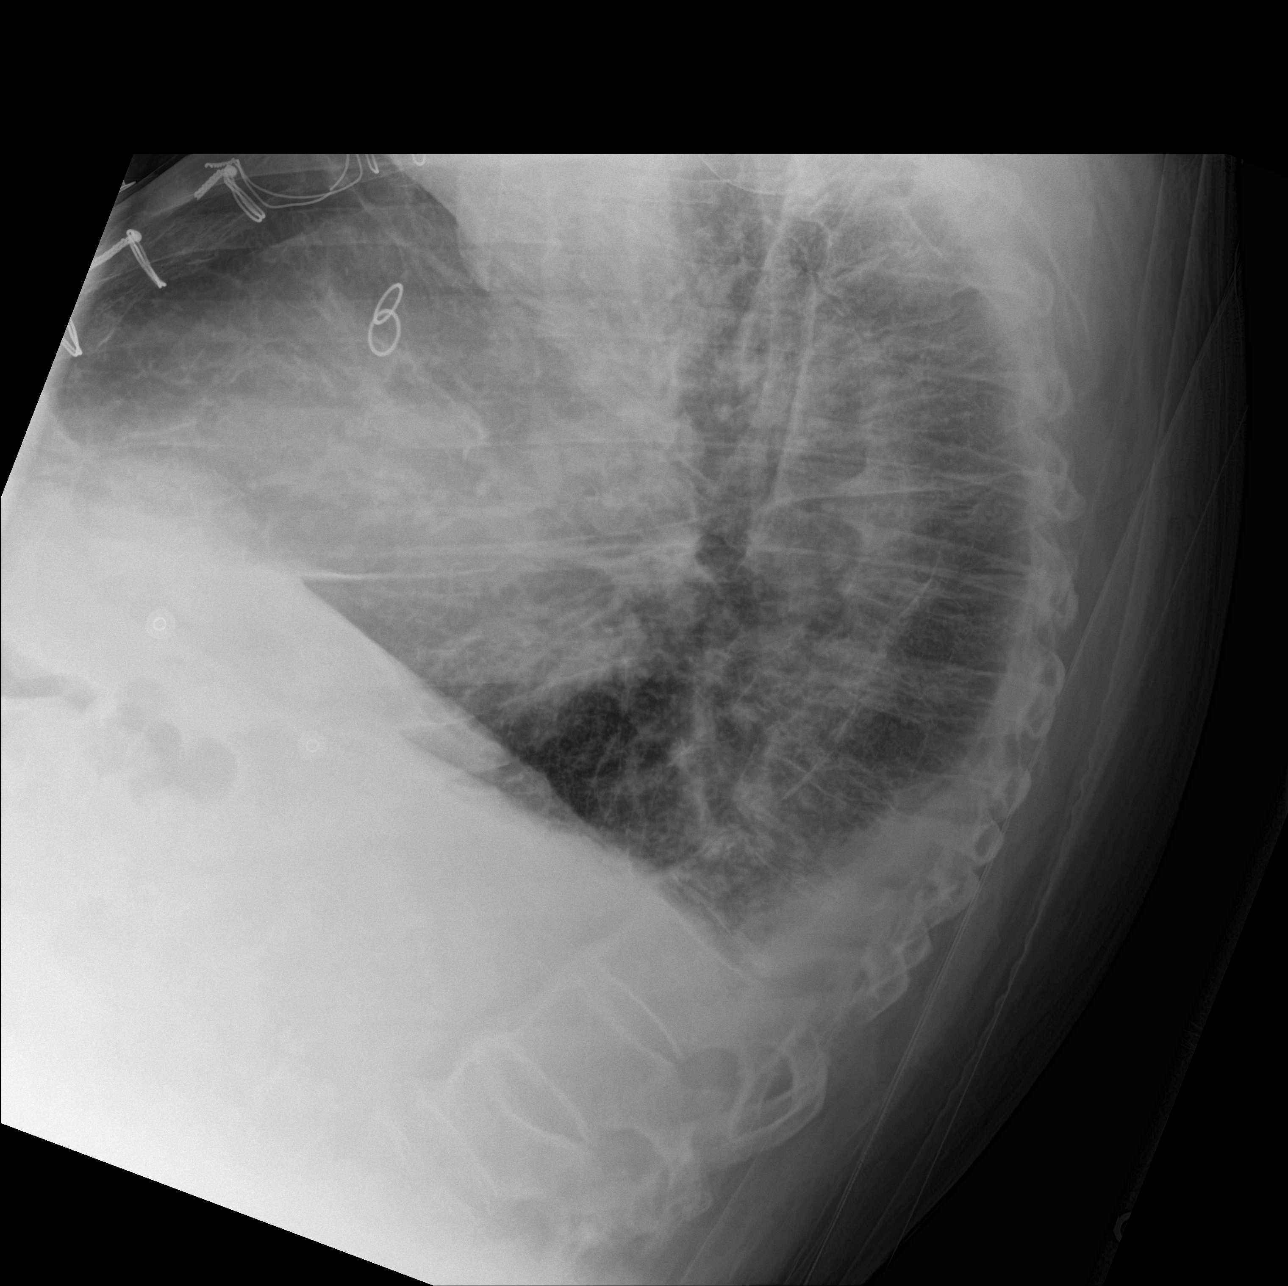

[chest ap]
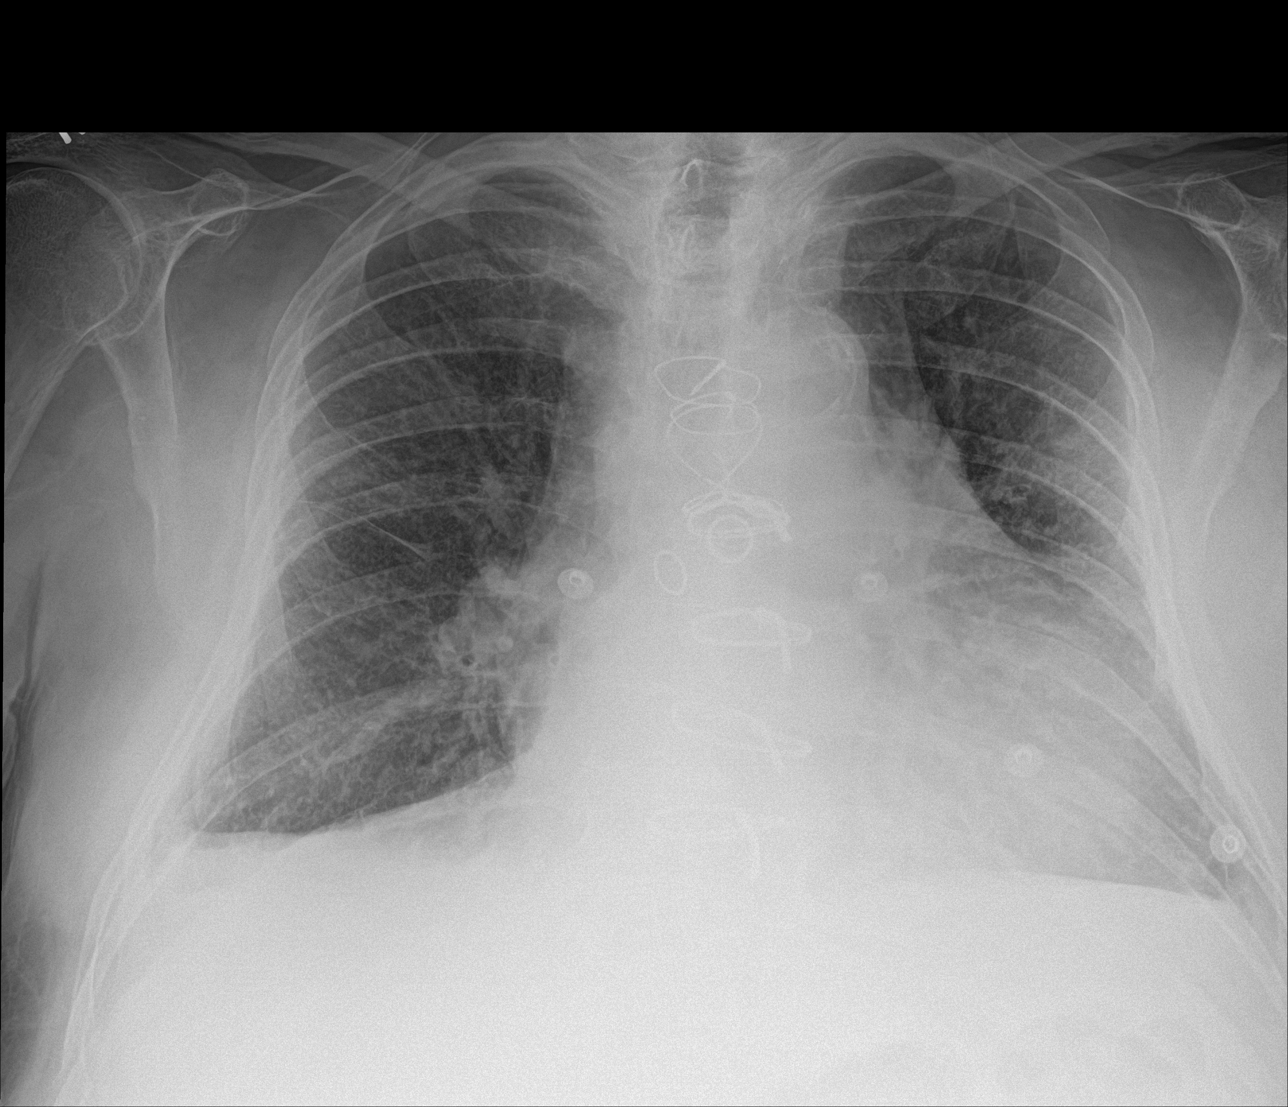

[3 of 3 positions shown; findings below may reference images not displayed]

FINDINGS: Cardiomegaly with pulmonary vascular congestion. No frank
interstitial edema.

Suspected small bilateral pleural effusions.  No pneumothorax.

Postsurgical changes related to prior CABG.  Median sternotomy.

Degenerative changes of the visualized thoracolumbar spine.
IMPRESSION: Cardiomegaly with pulmonary vascular congestion. No frank
interstitial edema.

Small bilateral pleural effusions.
